# Patient Record
Sex: Female | Born: 1985
Health system: Southern US, Community
[De-identification: ages and names within clinical notes are randomized; demographics above are authoritative.]

## PROBLEM LIST (undated history)

## (undated) ENCOUNTER — Inpatient Hospital Stay (HOSPITAL_COMMUNITY): Payer: Self-pay

## (undated) DIAGNOSIS — Z8759 Personal history of other complications of pregnancy, childbirth and the puerperium: Secondary | ICD-10-CM

## (undated) DIAGNOSIS — R0600 Dyspnea, unspecified: Secondary | ICD-10-CM

## (undated) DIAGNOSIS — D649 Anemia, unspecified: Secondary | ICD-10-CM

## (undated) DIAGNOSIS — N390 Urinary tract infection, site not specified: Secondary | ICD-10-CM

## (undated) DIAGNOSIS — R87629 Unspecified abnormal cytological findings in specimens from vagina: Secondary | ICD-10-CM

## (undated) DIAGNOSIS — Z789 Other specified health status: Secondary | ICD-10-CM

## (undated) DIAGNOSIS — M419 Scoliosis, unspecified: Secondary | ICD-10-CM

## (undated) HISTORY — PX: THERAPEUTIC ABORTION: SHX798

## (undated) HISTORY — DX: Unspecified abnormal cytological findings in specimens from vagina: R87.629

## (undated) HISTORY — DX: Personal history of other complications of pregnancy, childbirth and the puerperium: Z87.59

## (undated) HISTORY — PX: LEEP: SHX91

---

## 2008-07-03 ENCOUNTER — Emergency Department (HOSPITAL_BASED_OUTPATIENT_CLINIC_OR_DEPARTMENT_OTHER): Admission: EM | Admit: 2008-07-03 | Discharge: 2008-07-03 | Payer: Self-pay | Admitting: Emergency Medicine

## 2008-12-26 ENCOUNTER — Emergency Department (HOSPITAL_BASED_OUTPATIENT_CLINIC_OR_DEPARTMENT_OTHER): Admission: EM | Admit: 2008-12-26 | Discharge: 2008-12-26 | Payer: Self-pay | Admitting: Emergency Medicine

## 2008-12-26 ENCOUNTER — Ambulatory Visit: Payer: Self-pay | Admitting: Diagnostic Radiology

## 2010-12-02 LAB — WET PREP, GENITAL: Yeast Wet Prep HPF POC: NONE SEEN

## 2010-12-02 LAB — CBC
HCT: 43.9 % (ref 36.0–46.0)
Hemoglobin: 15.2 g/dL — ABNORMAL HIGH (ref 12.0–15.0)
MCV: 88.3 fL (ref 78.0–100.0)
Platelets: 170 10*3/uL (ref 150–400)
RBC: 4.97 MIL/uL (ref 3.87–5.11)
WBC: 8.9 10*3/uL (ref 4.0–10.5)

## 2010-12-02 LAB — URINE MICROSCOPIC-ADD ON

## 2010-12-02 LAB — COMPREHENSIVE METABOLIC PANEL
Albumin: 4.5 g/dL (ref 3.5–5.2)
Alkaline Phosphatase: 76 U/L (ref 39–117)
BUN: 17 mg/dL (ref 6–23)
CO2: 23 mEq/L (ref 19–32)
Chloride: 107 mEq/L (ref 96–112)
Creatinine, Ser: 0.8 mg/dL (ref 0.4–1.2)
GFR calc non Af Amer: >60 mL/min (ref >60)
Glucose, Bld: 84 mg/dL (ref 70–99)
Potassium: 3.8 mEq/L (ref 3.5–5.1)
Total Bilirubin: 0.9 mg/dL (ref 0.3–1.2)

## 2010-12-02 LAB — DIFFERENTIAL
Basophils Absolute: 0.2 10*3/uL — ABNORMAL HIGH (ref 0.0–0.1)
Basophils Relative: 2 % — ABNORMAL HIGH (ref 0–1)
Lymphocytes Relative: 6 % — ABNORMAL LOW (ref 12–46)
Monocytes Absolute: 0.4 10*3/uL (ref 0.1–1.0)
Neutro Abs: 7.7 10*3/uL (ref 1.7–7.7)
Neutrophils Relative %: 87 % — ABNORMAL HIGH (ref 43–77)

## 2010-12-02 LAB — URINALYSIS, ROUTINE W REFLEX MICROSCOPIC
Bilirubin Urine: NEGATIVE
Nitrite: NEGATIVE
Specific Gravity, Urine: 1.025 (ref 1.005–1.030)
Urobilinogen, UA: 1 mg/dL (ref 0.0–1.0)
pH: 6 (ref 5.0–8.0)

## 2010-12-02 LAB — RPR: RPR Ser Ql: NONREACTIVE

## 2010-12-02 LAB — PREGNANCY, URINE: Preg Test, Ur: NEGATIVE

## 2010-12-02 LAB — GC/CHLAMYDIA PROBE AMP, GENITAL: Chlamydia, DNA Probe: NEGATIVE

## 2010-12-02 LAB — LIPASE, BLOOD: Lipase: 45 U/L (ref 23–300)

## 2011-09-14 ENCOUNTER — Encounter (HOSPITAL_BASED_OUTPATIENT_CLINIC_OR_DEPARTMENT_OTHER): Payer: Self-pay

## 2011-09-14 ENCOUNTER — Emergency Department (HOSPITAL_BASED_OUTPATIENT_CLINIC_OR_DEPARTMENT_OTHER)
Admission: EM | Admit: 2011-09-14 | Discharge: 2011-09-14 | Disposition: A | Discharge status: Home / Self Care | Payer: Medicaid Other | Attending: Emergency Medicine | Admitting: Emergency Medicine

## 2011-09-14 DIAGNOSIS — R109 Unspecified abdominal pain: Secondary | ICD-10-CM | POA: Insufficient documentation

## 2011-09-14 DIAGNOSIS — N390 Urinary tract infection, site not specified: Secondary | ICD-10-CM

## 2011-09-14 DIAGNOSIS — R300 Dysuria: Secondary | ICD-10-CM | POA: Insufficient documentation

## 2011-09-14 HISTORY — DX: Scoliosis, unspecified: M41.9

## 2011-09-14 LAB — WET PREP, GENITAL: Trich, Wet Prep: NONE SEEN

## 2011-09-14 LAB — URINALYSIS, ROUTINE W REFLEX MICROSCOPIC
Bilirubin Urine: NEGATIVE
Ketones, ur: 15 mg/dL — AB
Specific Gravity, Urine: 1.029 (ref 1.005–1.030)
Urobilinogen, UA: 1 mg/dL (ref 0.0–1.0)
pH: 6 (ref 5.0–8.0)

## 2011-09-14 MED ORDER — SULFAMETHOXAZOLE-TRIMETHOPRIM 800-160 MG PO TABS: 1.0000 | ORAL_TABLET | Freq: Two times a day (BID) | ORAL | Status: AC

## 2011-09-14 NOTE — ED Provider Notes (Signed)
History     CSN: [id number]  Arrival date & time 09/14/11  Theresa Winters   First MD Initiated Contact with Patient 09/14/11 1944      Chief Complaint  Patient presents with  . Abdominal Pain    (Consider location/radiation/quality/duration/timing/severity/associated sxs/prior treatment) HPI Comments: Pt states that she has had some urinary frequency:pt states that she had a period for the first time in 9 months since her last pregnancy:pt states that she is having Winters abdominal pressure like she had when she was pregnant and has salmonella poisoning  Patient is a 26 y.o. female presenting with abdominal pain. The history is provided by the patient. No language interpreter was used.  Abdominal Pain The primary symptoms of the illness include abdominal pain, dysuria and vaginal discharge. The primary symptoms of the illness do not include nausea, vomiting or vaginal bleeding. The current episode started 2 days ago. The onset of the illness was gradual. The problem has not changed since onset. The dysuria is associated with frequency and urgency.  The vaginal discharge is associated with dysuria.   Additional symptoms associated with the illness include urgency and frequency. Symptoms associated with the illness do not include anorexia or back pain.    Past Medical History  Diagnosis Date  . Scoliosis     History reviewed. No pertinent past surgical history.  History reviewed. No pertinent family history.  History  Substance Use Topics  . Smoking status: Never Smoker   . Smokeless tobacco: Never Used  . Alcohol Use: No    OB History    Grav Para Term Preterm Abortions TAB SAB Ect Mult Living                  Review of Systems  Gastrointestinal: Positive for abdominal pain. Negative for nausea, vomiting and anorexia.  Genitourinary: Positive for dysuria, urgency, frequency and vaginal discharge. Negative for vaginal bleeding.  Musculoskeletal: Negative for back pain.  All  other systems reviewed and are negative.    Allergies  Review of patient's allergies indicates no known allergies.  Home Medications  No current outpatient prescriptions on file.  BP 128/84  Pulse 70  Temp(Src) 98.2 F (36.8 C) (Oral)  Resp 16  Ht 5\' 6"  (1.676 m)  Wt 130 lb (58.968 kg)  BMI 20.98 kg/m2  SpO2 100%  LMP 09/06/2011  Physical Exam  Nursing note and vitals reviewed. Constitutional: She is oriented to person, place, and time. She appears well-developed and well-nourished.  HENT:  Head: Normocephalic and atraumatic.  Neck: Normal range of motion. Neck supple.  Cardiovascular: Normal rate and regular rhythm.   Pulmonary/Chest: Effort normal and breath sounds normal.  Abdominal: Soft. Bowel sounds are normal. There is no tenderness.  Genitourinary:       Pt has brown vaginal discharge  Musculoskeletal: Normal range of motion.  Neurological: She is alert and oriented to person, place, and time.  Skin: Skin is warm and dry.  Psychiatric: She has a normal mood and affect.    ED Course  Procedures (including critical care time)  Labs Reviewed  URINALYSIS, ROUTINE W REFLEX MICROSCOPIC - Abnormal; Notable for the following:    Color, Urine AMBER (*) BIOCHEMICALS MAY BE AFFECTED BY COLOR   APPearance CLOUDY (*)    Hgb urine dipstick LARGE (*)    Ketones, ur 15 (*)    Protein, ur 100 (*)    Nitrite POSITIVE (*)    Leukocytes, UA MODERATE (*)    All other components within  normal limits  WET PREP, GENITAL - Abnormal; Notable for the following:    Clue Cells, Wet Prep FEW (*)    WBC, Wet Prep HPF POC RARE (*)    All other components within normal limits  URINE MICROSCOPIC-ADD ON - Abnormal; Notable for the following:    Bacteria, UA MANY (*)    All other components within normal limits  PREGNANCY, URINE  GC/CHLAMYDIA PROBE AMP, GENITAL   No results found.   1. UTI (Winters urinary tract infection)       MDM  Exam not consistent with [id number] sent  will treat for uti       Theresa Lower, NP 09/14/11 2100

## 2011-09-14 NOTE — ED Provider Notes (Signed)
Medical screening examination/treatment/procedure(s) were performed by non-physician practitioner and as supervising physician I was immediately available for consultation/collaboration.   Elane Peabody, MD 09/14/11 2326 

## 2011-09-14 NOTE — ED Notes (Signed)
Pt states that she had abdominal pain onset two days ago, noticed tissue coming from her vagina.  Pt states that the last time she had this she had salmonella poisoning during her pregnancy.  No chance that she was pregnant or could be pregnant today.

## 2011-09-15 LAB — GC/CHLAMYDIA PROBE AMP, GENITAL
Chlamydia, DNA Probe: NEGATIVE
GC Probe Amp, Genital: NEGATIVE

## 2012-06-18 ENCOUNTER — Encounter (HOSPITAL_BASED_OUTPATIENT_CLINIC_OR_DEPARTMENT_OTHER): Payer: Self-pay | Admitting: *Deleted

## 2012-06-18 ENCOUNTER — Other Ambulatory Visit: Payer: Self-pay

## 2012-06-18 ENCOUNTER — Emergency Department (HOSPITAL_BASED_OUTPATIENT_CLINIC_OR_DEPARTMENT_OTHER)
Admission: EM | Admit: 2012-06-18 | Discharge: 2012-06-19 | Disposition: A | Discharge status: Home / Self Care | Payer: Medicaid Other | Attending: Emergency Medicine | Admitting: Emergency Medicine

## 2012-06-18 ENCOUNTER — Emergency Department (HOSPITAL_BASED_OUTPATIENT_CLINIC_OR_DEPARTMENT_OTHER): Payer: Medicaid Other

## 2012-06-18 DIAGNOSIS — F419 Anxiety disorder, unspecified: Secondary | ICD-10-CM

## 2012-06-18 DIAGNOSIS — F411 Generalized anxiety disorder: Secondary | ICD-10-CM | POA: Insufficient documentation

## 2012-06-18 DIAGNOSIS — Z8739 Personal history of other diseases of the musculoskeletal system and connective tissue: Secondary | ICD-10-CM | POA: Insufficient documentation

## 2012-06-18 LAB — D-DIMER, QUANTITATIVE: D-Dimer, Quant: <0.27 ug/mL-FEU (ref 0.00–0.48)

## 2012-06-18 MED ORDER — GI COCKTAIL ~~LOC~~
30.0000 mL | Freq: Once | ORAL | Status: DC
Start: 2012-06-18 — End: 2012-06-19

## 2012-06-18 MED ORDER — ALUM & MAG HYDROXIDE-SIMETH 200-200-20 MG/5ML PO SUSP
30.0000 mL | Freq: Once | ORAL | Status: AC
  Administered 2012-06-19: 30 mL via ORAL
  Filled 2012-06-18: qty 30

## 2012-06-18 NOTE — ED Provider Notes (Signed)
History  This chart was scribed for Logyn Kendrick Smitty Cords, MD by Ladona Ridgel Day. This patient was seen in room MH03/MH03 and the patient's care was started at 2209.   CSN: [id number]  Arrival date & time 06/18/12  2209   First MD Initiated Contact with Patient 06/18/12 2301      Chief Complaint  Patient presents with  . Chest Pain   Patient is a 26 y.o. female presenting with chest pain. The history is provided by the patient. No language interpreter was used.  Chest Pain The chest pain began 1 - 2 weeks ago. Chest pain occurs frequently. The chest pain is unchanged. The pain is associated with stress. The quality of the pain is described as dull. The pain does not radiate. Chest pain is worsened by stress. Pertinent negatives for primary symptoms include no fever, no shortness of breath, no abdominal pain, no nausea and no vomiting.  Pertinent negatives for associated symptoms include no claudication and no weakness. She tried nothing for the symptoms. Risk factors include no known risk factors.  Her past medical history is significant for anxiety/panic attacks.  Procedure history is negative for cardiac catheterization.    Ryker Pherigo is a 26 y.o. female who presents to the Emergency Department w/hx of anxiety complaining of constant CP/chest pressure for the past week. She states usually gets exacerbation of her chest pressure when she goes into work everyday because of high stress levels. She states some back pain. She takes Depo provera. She denies any recent long travels.   Past Medical History  Diagnosis Date  . Scoliosis   . Scoliosis     History reviewed. No pertinent past surgical history.  History reviewed. No pertinent family history.  History  Substance Use Topics  . Smoking status: Never Smoker   . Smokeless tobacco: Never Used  . Alcohol Use: No    OB History    Grav Para Term Preterm Abortions TAB SAB Ect Mult Living                  Review of Systems    Constitutional: Negative for fever and chills.  Respiratory: Positive for chest tightness. Negative for shortness of breath.   Cardiovascular: Positive for chest pain. Negative for claudication.  Gastrointestinal: Negative for nausea, vomiting and abdominal pain.  Skin: Negative for color change.  Neurological: Negative for weakness.  Psychiatric/Behavioral: The patient is nervous/anxious.   All other systems reviewed and are negative.    Allergies  Review of patient's allergies indicates no known allergies.  Home Medications  No current outpatient prescriptions on file.  Triage Vitals: BP 114/75  Pulse 82  Temp 97.9 F (36.6 C)  Resp 15  SpO2 100%  LMP 03/18/2012  Physical Exam  Nursing note and vitals reviewed. Constitutional: She is oriented to person, place, and time. She appears well-developed and well-nourished. No distress.  HENT:  Head: Normocephalic and atraumatic.  Mouth/Throat: Oropharynx is clear and moist.  Eyes: EOM are normal.  Neck: Neck supple. No tracheal deviation present.  Cardiovascular: Normal rate, regular rhythm and normal heart sounds.   No murmur heard. Pulmonary/Chest: Effort normal and breath sounds normal. No respiratory distress. She has no wheezes. She has no rales.  Abdominal: Soft. Bowel sounds are normal. She exhibits no distension. There is no tenderness. There is no rebound and no guarding.  Musculoskeletal: Normal range of motion.  Neurological: She is alert and oriented to person, place, and time. She displays normal reflexes.  Skin:  Skin is warm and dry.  Psychiatric: She has a normal mood and affect. Her behavior is normal.    ED Course  Procedures (including critical care time) DIAGNOSTIC STUDIES: Oxygen Saturation is 100% on room air, normal by my interpretation.    COORDINATION OF CARE: At 1110 PM Discussed treatment plan with patient which includes GI cocktail, CXR, D-dimer, preg UA. Patient agrees.   Labs Reviewed - No  data to display No results found.   No diagnosis found.    MDM   Date: 06/19/2012  Rate: 67  Rhythm: normal sinus rhythm  QRS Axis: normal  Intervals: normal  ST/T Wave abnormalities: normal  Conduction Disutrbances: none  Narrative Interpretation: unremarkable  I personally performed the services described in this documentation, which was scribed in my presence. The recorded information has been reviewed and considered.   Pain is clearly not cardiac in nature.  Patient seems to be having anxiety as episodes happen at work where patient is under stress.  Follow up with your PMD for ongoing care            Dinnis Rog K Tyresse Jayson-Rasch, MD 06/19/12 [phone]

## 2012-06-18 NOTE — ED Notes (Signed)
Pt reports chest pressure x one week and fells like her" heart is giving out"  Pt also reports SOB and one episode of N/V at intial onset one week ago

## 2012-06-19 NOTE — ED Notes (Signed)
Transported to xray 

## 2012-06-19 NOTE — ED Notes (Signed)
Returned from xray

## 2013-01-09 ENCOUNTER — Encounter (HOSPITAL_BASED_OUTPATIENT_CLINIC_OR_DEPARTMENT_OTHER): Payer: Self-pay | Admitting: Family Medicine

## 2013-01-09 ENCOUNTER — Emergency Department (HOSPITAL_BASED_OUTPATIENT_CLINIC_OR_DEPARTMENT_OTHER)
Admission: EM | Admit: 2013-01-09 | Discharge: 2013-01-09 | Disposition: A | Discharge status: Home / Self Care | Payer: Self-pay | Attending: Emergency Medicine | Admitting: Emergency Medicine

## 2013-01-09 DIAGNOSIS — R300 Dysuria: Secondary | ICD-10-CM | POA: Insufficient documentation

## 2013-01-09 DIAGNOSIS — R109 Unspecified abdominal pain: Secondary | ICD-10-CM | POA: Insufficient documentation

## 2013-01-09 DIAGNOSIS — N760 Acute vaginitis: Secondary | ICD-10-CM | POA: Insufficient documentation

## 2013-01-09 DIAGNOSIS — Z8739 Personal history of other diseases of the musculoskeletal system and connective tissue: Secondary | ICD-10-CM | POA: Insufficient documentation

## 2013-01-09 DIAGNOSIS — B9689 Other specified bacterial agents as the cause of diseases classified elsewhere: Secondary | ICD-10-CM

## 2013-01-09 DIAGNOSIS — Z3202 Encounter for pregnancy test, result negative: Secondary | ICD-10-CM | POA: Insufficient documentation

## 2013-01-09 DIAGNOSIS — N898 Other specified noninflammatory disorders of vagina: Secondary | ICD-10-CM | POA: Insufficient documentation

## 2013-01-09 LAB — URINALYSIS, ROUTINE W REFLEX MICROSCOPIC
Bilirubin Urine: NEGATIVE
Nitrite: NEGATIVE
Specific Gravity, Urine: 1.026 (ref 1.005–1.030)
Urobilinogen, UA: 1 mg/dL (ref 0.0–1.0)
pH: 5.5 (ref 5.0–8.0)

## 2013-01-09 LAB — WET PREP, GENITAL
Trich, Wet Prep: NONE SEEN
Yeast Wet Prep HPF POC: NONE SEEN

## 2013-01-09 LAB — PREGNANCY, URINE: Preg Test, Ur: NEGATIVE

## 2013-01-09 MED ORDER — METRONIDAZOLE 500 MG PO TABS: 500.0000 mg | ORAL_TABLET | Freq: Two times a day (BID) | ORAL | Status: DC

## 2013-01-09 NOTE — ED Provider Notes (Signed)
Medical screening examination/treatment/procedure(s) were performed by non-physician practitioner and as supervising physician I was immediately available for consultation/collaboration.   Joya Gaskins, MD 01/09/13 [phone]

## 2013-01-09 NOTE — ED Notes (Addendum)
Pt c/o left sided intermittent pain with a "knot that has been present for years". Pt reports recent uti and white vaginal discharge. Pt also sts she stopped depo shots and has been bleeding frequently. Pt gynecologist unable to see her for 2 wks.

## 2013-01-09 NOTE — ED Provider Notes (Signed)
History     CSN: [id number]  Arrival date & time 01/09/13  1215   First MD Initiated Contact with Patient 01/09/13 1219      Chief Complaint  Patient presents with  . Mass    (Consider location/radiation/quality/duration/timing/severity/associated sxs/prior treatment) Patient is a 27 y.o. female presenting with vaginal discharge. The history is provided by the patient and medical records. No language interpreter was used.  Vaginal Discharge This is a new problem. The current episode started in the past 7 days. The problem occurs intermittently. The problem has been waxing and waning. Associated symptoms include urinary symptoms. Pertinent negatives include no abdominal pain, anorexia, arthralgias, change in bowel habit, chest pain, chills, congestion, coughing, diaphoresis, fatigue, fever, headaches, joint swelling, myalgias, nausea, neck pain, numbness, rash, sore throat, swollen glands, vertigo, visual change, vomiting or weakness. Nothing aggravates the symptoms. Treatments tried: a prescribed yeast cream that was old. The treatment provided mild relief.   Theresa Winters is a 27 y.o. female  with a hx of scoliosis presents to the Emergency Department complaining of gradual, persistent, progressively worsening vaginal discharge, itching and dysuria for approximately one week. Patient states she used a vaginal cream she was prescribed several years ago for vaginal itching and states that it helped some; nothing seems to make it worse. Associated symptoms include a thick white vaginal discharge. Patient also complains of irregular menses after coming off of her Depo shot. She states she bleeds often and heavily.  She denies fever, chills, chest pain, shortness of breath abdominal pain nausea vomiting, diarrhea, weakness, dizziness, syncope.    Past Medical History  Diagnosis Date  . Scoliosis   . Scoliosis     History reviewed. No pertinent past surgical history.  No family history on  file.  History  Substance Use Topics  . Smoking status: Never Smoker   . Smokeless tobacco: Never Used  . Alcohol Use: No    OB History   Grav Para Term Preterm Abortions TAB SAB Ect Mult Living                  Review of Systems  Constitutional: Negative for fever, chills, diaphoresis, appetite change, fatigue and unexpected weight change.  HENT: Negative for congestion, sore throat, neck pain and neck stiffness.   Respiratory: Negative for cough, chest tightness and shortness of breath.   Cardiovascular: Negative for chest pain.  Gastrointestinal: Negative for nausea, vomiting, abdominal pain, diarrhea, constipation, blood in stool, abdominal distention, rectal pain, anorexia and change in bowel habit.  Genitourinary: Positive for dysuria, vaginal bleeding, vaginal discharge, menstrual problem and pelvic pain. Negative for urgency, frequency, hematuria, flank pain, difficulty urinating and vaginal pain.  Musculoskeletal: Negative for myalgias, back pain, joint swelling and arthralgias.  Skin: Negative for rash.  Neurological: Negative for dizziness, vertigo, weakness, light-headedness, numbness and headaches.  All other systems reviewed and are negative.    Allergies  Review of patient's allergies indicates no known allergies.  Home Medications   Current Outpatient Rx  Name  Route  Sig  Dispense  Refill  . Multiple Vitamins-Calcium (ONE-A-DAY WOMENS PO)   Oral   Take by mouth.         . metroNIDAZOLE (FLAGYL) 500 MG tablet   Oral   Take 1 tablet (500 mg total) by mouth 2 (two) times daily. One po bid x 7 days   14 tablet   0     BP 121/76  Pulse 68  Temp(Src) 97.9 F (36.6 C) (Oral)  Resp 16  SpO2 100%  LMP 01/08/2013  Physical Exam  Nursing note and vitals reviewed. Constitutional: She is oriented to person, place, and time. She appears well-developed and well-nourished. No distress.  HENT:  Head: Normocephalic and atraumatic.  Mouth/Throat:  Oropharynx is clear and moist.  Eyes: Conjunctivae are normal. Pupils are equal, round, and reactive to light. No scleral icterus.  Neck: Normal range of motion. Neck supple.  Cardiovascular: Normal rate, regular rhythm, normal heart sounds and intact distal pulses.   No murmur heard. Pulmonary/Chest: Effort normal and breath sounds normal. No respiratory distress. She has no wheezes. She has no rales. She exhibits no tenderness.  Abdominal: Soft. Normal appearance and bowel sounds are normal. She exhibits no mass. There is no hepatosplenomegaly. There is no tenderness. There is no rigidity, no rebound, no guarding and no CVA tenderness. Hernia confirmed negative in the right inguinal area and confirmed negative in the left inguinal area.    Genitourinary: Uterus normal. Pelvic exam was performed with patient supine. There is no rash, tenderness or lesion on the right labia. There is no rash, tenderness or lesion on the left labia. Uterus is not deviated, not enlarged, not fixed and not tender. Cervix exhibits no motion tenderness, no discharge and no friability. Right adnexum displays no mass, no tenderness and no fullness. Left adnexum displays no mass, no tenderness and no fullness. There is bleeding around the vagina. No erythema or tenderness around the vagina. No foreign body around the vagina. No signs of injury around the vagina. Vaginal discharge (thick, color obscured by blood, minmal amout) found.  Musculoskeletal: Normal range of motion. She exhibits no edema and no tenderness.  Lymphadenopathy:    She has no cervical adenopathy.       Right: No inguinal adenopathy present.       Left: No inguinal adenopathy present.  Neurological: She is alert and oriented to person, place, and time. She exhibits normal muscle tone. Coordination normal.  Speech is clear and goal oriented Moves extremities without ataxia  Skin: Skin is warm and dry. No rash noted. She is not diaphoretic.  Psychiatric:  She has a normal mood and affect.    ED Course  Procedures (including critical care time)  Labs Reviewed  WET PREP, GENITAL - Abnormal; Notable for the following:    Clue Cells Wet Prep HPF POC FEW (*)    WBC, Wet Prep HPF POC FEW (*)    All other components within normal limits  URINALYSIS, ROUTINE W REFLEX MICROSCOPIC - Abnormal; Notable for the following:    Ketones, ur 15 (*)    All other components within normal limits  GC/CHLAMYDIA PROBE AMP  PREGNANCY, URINE   No results found.   1. BV (bacterial vaginosis)       MDM  Dianah Field presents with vaginal discharge and dysuria as well as complaints of her regular menses.  Will obtain urine and vaginal samples.  Urinalysis without evidence of urinary tract infection, pregnancy test negative, wet prep with few clue cells and a few WBCs.   Pt understands that they have GC/Chlamydia cultures pending and that they will need to inform all sexual partners if results return positive, though the patient denies sexual activity. Pt exam not concerning for PID because hemodynamically stable and no cervical motion tenderness on pelvic exam. Pt has also been treated with flagyl for Bacterial Vaginosis and has been advised to not drink alcohol while on this medication.   Also discussed with patient  the need to followup with her OB/GYN to discuss further birth control options and regulation of her menses.  I have also discussed reasons to return immediately to the ER.  Patient expresses understanding and agrees with plan.             Dahlia Client Johnwesley Lederman, PA-C 01/09/13 1327

## 2013-01-10 LAB — GC/CHLAMYDIA PROBE AMP: CT Probe RNA: NEGATIVE

## 2013-01-13 ENCOUNTER — Encounter (HOSPITAL_BASED_OUTPATIENT_CLINIC_OR_DEPARTMENT_OTHER): Payer: Self-pay | Admitting: Emergency Medicine

## 2013-01-13 DIAGNOSIS — R0982 Postnasal drip: Secondary | ICD-10-CM | POA: Insufficient documentation

## 2013-01-13 DIAGNOSIS — N390 Urinary tract infection, site not specified: Secondary | ICD-10-CM | POA: Insufficient documentation

## 2013-01-13 DIAGNOSIS — Z3202 Encounter for pregnancy test, result negative: Secondary | ICD-10-CM | POA: Insufficient documentation

## 2013-01-13 DIAGNOSIS — K029 Dental caries, unspecified: Secondary | ICD-10-CM | POA: Insufficient documentation

## 2013-01-13 DIAGNOSIS — R300 Dysuria: Secondary | ICD-10-CM | POA: Insufficient documentation

## 2013-01-13 DIAGNOSIS — R51 Headache: Secondary | ICD-10-CM | POA: Insufficient documentation

## 2013-01-13 DIAGNOSIS — Z8739 Personal history of other diseases of the musculoskeletal system and connective tissue: Secondary | ICD-10-CM | POA: Insufficient documentation

## 2013-01-13 NOTE — ED Notes (Signed)
Pt reports "sinus infection". C/o nasal congestion, sore throat, and eyes watering. Reports right temporal HA as well.

## 2013-01-14 ENCOUNTER — Emergency Department (HOSPITAL_BASED_OUTPATIENT_CLINIC_OR_DEPARTMENT_OTHER)
Admission: EM | Admit: 2013-01-14 | Discharge: 2013-01-14 | Disposition: A | Discharge status: Home / Self Care | Payer: Self-pay | Attending: Emergency Medicine | Admitting: Emergency Medicine

## 2013-01-14 DIAGNOSIS — R0982 Postnasal drip: Secondary | ICD-10-CM

## 2013-01-14 DIAGNOSIS — N390 Urinary tract infection, site not specified: Secondary | ICD-10-CM

## 2013-01-14 DIAGNOSIS — K029 Dental caries, unspecified: Secondary | ICD-10-CM

## 2013-01-14 LAB — URINALYSIS, ROUTINE W REFLEX MICROSCOPIC
Glucose, UA: NEGATIVE mg/dL
Hgb urine dipstick: NEGATIVE
Specific Gravity, Urine: 1.037 — ABNORMAL HIGH (ref 1.005–1.030)
pH: 6.5 (ref 5.0–8.0)

## 2013-01-14 LAB — URINE MICROSCOPIC-ADD ON

## 2013-01-14 MED ORDER — PENICILLIN V POTASSIUM 500 MG PO TABS: 500.0000 mg | ORAL_TABLET | Freq: Four times a day (QID) | ORAL | Status: AC

## 2013-01-14 MED ORDER — NITROFURANTOIN MONOHYD MACRO 100 MG PO CAPS
100.0000 mg | ORAL_CAPSULE | Freq: Once | ORAL | Status: AC
  Administered 2013-01-14: 100 mg via ORAL
  Filled 2013-01-14: qty 1

## 2013-01-14 MED ORDER — IBUPROFEN 600 MG PO TABS: 600.0000 mg | ORAL_TABLET | Freq: Four times a day (QID) | ORAL | Status: DC | PRN

## 2013-01-14 MED ORDER — PENICILLIN V POTASSIUM 250 MG PO TABS
500.0000 mg | ORAL_TABLET | Freq: Once | ORAL | Status: AC
  Administered 2013-01-14: 500 mg via ORAL
  Filled 2013-01-14: qty 2

## 2013-01-14 MED ORDER — NITROFURANTOIN MONOHYD MACRO 100 MG PO CAPS: 100.0000 mg | ORAL_CAPSULE | Freq: Two times a day (BID) | ORAL | Status: DC

## 2013-01-14 MED ORDER — IBUPROFEN 800 MG PO TABS
800.0000 mg | ORAL_TABLET | Freq: Once | ORAL | Status: AC
  Administered 2013-01-14: 800 mg via ORAL
  Filled 2013-01-14: qty 1

## 2013-01-14 NOTE — ED Provider Notes (Signed)
History     CSN: [id number]  Arrival date & time 01/13/13  2334   First MD Initiated Contact with Patient 01/14/13 0129      Chief Complaint  Patient presents with  . Nasal Congestion  . Headache    (Consider location/radiation/quality/duration/timing/severity/associated sxs/prior treatment) Patient is a 27 y.o. female presenting with headaches. The history is provided by the patient.  Headache Location: right facial. Radiates to:  Does not radiate Onset quality:  Gradual Timing:  Constant Progression:  Unchanged Chronicity:  New Context: not defecating   Context comment:  Nasal congestion and rhinorrhea and mouth pain Relieved by:  Nothing Worsened by:  Nothing tried Ineffective treatments:  None tried Associated symptoms: no abdominal pain, no fever, no neck pain, no neck stiffness, no numbness and no sinus pressure   Associated symptoms comment:  Dysuria x one week Risk factors: no family hx of SAH     Past Medical History  Diagnosis Date  . Scoliosis   . Scoliosis     History reviewed. No pertinent past surgical history.  No family history on file.  History  Substance Use Topics  . Smoking status: Never Smoker   . Smokeless tobacco: Never Used  . Alcohol Use: No    OB History   Grav Para Term Preterm Abortions TAB SAB Ect Mult Living                  Review of Systems  Constitutional: Negative for fever.  HENT: Negative for drooling, neck pain, neck stiffness, voice change and sinus pressure.   Gastrointestinal: Negative for abdominal pain.  Neurological: Positive for headaches. Negative for facial asymmetry, speech difficulty and numbness.  All other systems reviewed and are negative.    Allergies  Review of patient's allergies indicates no known allergies.  Home Medications   Current Outpatient Rx  Name  Route  Sig  Dispense  Refill  . metroNIDAZOLE (FLAGYL) 500 MG tablet   Oral   Take 1 tablet (500 mg total) by mouth 2 (two) times  daily. One po bid x 7 days   14 tablet   0   . Multiple Vitamins-Calcium (ONE-A-DAY WOMENS PO)   Oral   Take by mouth.           BP 116/79  Pulse 74  Temp(Src) 97.7 F (36.5 C) (Oral)  Resp 16  Ht 5\' 7"  (1.702 m)  Wt 135 lb (61.236 kg)  BMI 21.14 kg/m2  SpO2 98%  LMP 01/08/2013  Physical Exam  Constitutional: She is oriented to person, place, and time. She appears well-developed and well-nourished. No distress.  HENT:  Head: Normocephalic and atraumatic.  Mouth/Throat: Oropharynx is clear and moist.    Clear colorless post nasal drainage  Eyes: Conjunctivae and EOM are normal. Pupils are equal, round, and reactive to light.  No temporal tenderness  Neck: Normal range of motion. Neck supple.  Cardiovascular: Normal rate, regular rhythm and intact distal pulses.   Pulmonary/Chest: Effort normal. She has no wheezes. She has no rales.  Abdominal: Soft. Bowel sounds are normal.  Musculoskeletal: Normal range of motion. She exhibits no edema.  Lymphadenopathy:    She has no cervical adenopathy.  Neurological: She is alert and oriented to person, place, and time. No cranial nerve deficit.  Skin: Skin is warm and dry.  Psychiatric: She has a normal mood and affect.    ED Course  Procedures (including critical care time)  Labs Reviewed  URINALYSIS, ROUTINE W REFLEX MICROSCOPIC -  Abnormal; Notable for the following:    Color, Urine BROWN (*)    Specific Gravity, Urine 1.037 (*)    Bilirubin Urine SMALL (*)    Ketones, ur 15 (*)    Protein, ur 30 (*)    Nitrite POSITIVE (*)    Leukocytes, UA SMALL (*)    All other components within normal limits  URINE MICROSCOPIC-ADD ON - Abnormal; Notable for the following:    Bacteria, UA FEW (*)    All other components within normal limits  PREGNANCY, URINE   No results found.   No diagnosis found.    MDM  Facial pain is from dental caries, and urinary symptoms from UTI will need to cover for both and arrange close  follow up with dentist       Kristy Catoe K Trayvon Trumbull-Rasch, MD 01/14/13 [phone]

## 2013-04-26 ENCOUNTER — Emergency Department (HOSPITAL_BASED_OUTPATIENT_CLINIC_OR_DEPARTMENT_OTHER)
Admission: EM | Admit: 2013-04-26 | Discharge: 2013-04-26 | Disposition: A | Discharge status: Home / Self Care | Payer: Medicaid Other | Attending: Emergency Medicine | Admitting: Emergency Medicine

## 2013-04-26 ENCOUNTER — Encounter (HOSPITAL_BASED_OUTPATIENT_CLINIC_OR_DEPARTMENT_OTHER): Payer: Self-pay | Admitting: Student

## 2013-04-26 DIAGNOSIS — N898 Other specified noninflammatory disorders of vagina: Secondary | ICD-10-CM | POA: Insufficient documentation

## 2013-04-26 DIAGNOSIS — Z8739 Personal history of other diseases of the musculoskeletal system and connective tissue: Secondary | ICD-10-CM | POA: Insufficient documentation

## 2013-04-26 DIAGNOSIS — Z3202 Encounter for pregnancy test, result negative: Secondary | ICD-10-CM | POA: Insufficient documentation

## 2013-04-26 DIAGNOSIS — R300 Dysuria: Secondary | ICD-10-CM | POA: Insufficient documentation

## 2013-04-26 LAB — URINALYSIS, ROUTINE W REFLEX MICROSCOPIC
Bilirubin Urine: NEGATIVE
Glucose, UA: NEGATIVE mg/dL
Hgb urine dipstick: NEGATIVE
Ketones, ur: NEGATIVE mg/dL
Protein, ur: NEGATIVE mg/dL
Urobilinogen, UA: 1 mg/dL (ref 0.0–1.0)

## 2013-04-26 LAB — URINE MICROSCOPIC-ADD ON

## 2013-04-26 LAB — WET PREP, GENITAL: Yeast Wet Prep HPF POC: NONE SEEN

## 2013-04-26 MED ORDER — NITROFURANTOIN MONOHYD MACRO 100 MG PO CAPS: 100.0000 mg | ORAL_CAPSULE | Freq: Two times a day (BID) | ORAL | Status: AC

## 2013-04-26 NOTE — ED Provider Notes (Signed)
CSN: [id number]     Arrival date & time 04/26/13  1629 History   First MD Initiated Contact with Patient 04/26/13 1649     Chief Complaint  Patient presents with  . Flank Pain    left side x 1 week   (Consider location/radiation/quality/duration/timing/severity/associated sxs/prior Treatment) Patient is a 27 y.o. female presenting with vaginal discharge. The history is provided by the patient. No language interpreter was used.  Vaginal Discharge Quality:  Clear Severity:  Moderate Timing:  Constant Progression:  Worsening Chronicity:  New Context: not after intercourse   Relieved by:  Nothing Ineffective treatments:  None tried Associated symptoms: dysuria   Pt reports she has had a vaginal odor.   Pt reports she was previously diagnosed with a uti and a vaginal infection.   Pt did not take medications  Past Medical History  Diagnosis Date  . Scoliosis   . Scoliosis    History reviewed. No pertinent past surgical history. History reviewed. No pertinent family history. History  Substance Use Topics  . Smoking status: Never Smoker   . Smokeless tobacco: Never Used  . Alcohol Use: No   OB History   Grav Para Term Preterm Abortions TAB SAB Ect Mult Living                 Review of Systems  Genitourinary: Positive for dysuria and vaginal discharge.  All other systems reviewed and are negative.    Allergies  Review of patient's allergies indicates no known allergies.  Home Medications   Current Outpatient Rx  Name  Route  Sig  Dispense  Refill  . ibuprofen (ADVIL,MOTRIN) 600 MG tablet   Oral   Take 1 tablet (600 mg total) by mouth every 6 (six) hours as needed for pain.   30 tablet   0   . Multiple Vitamins-Calcium (ONE-A-DAY WOMENS PO)   Oral   Take by mouth.          BP 132/77  Pulse 77  Temp(Src) 98.2 F (36.8 C) (Oral)  Resp 20  Wt 135 lb (61.236 kg)  BMI 21.14 kg/m2  SpO2 100%  LMP 04/22/2013 Physical Exam  Constitutional: She is oriented to  person, place, and time. She appears well-developed and well-nourished.  HENT:  Head: Normocephalic.  Eyes: Pupils are equal, round, and reactive to light.  Neck: Normal range of motion.  Cardiovascular: Normal rate.   Pulmonary/Chest: Effort normal and breath sounds normal.  Abdominal: Soft.  Genitourinary: Vaginal discharge found.  Scant amount white discharge  Musculoskeletal: Normal range of motion.  Neurological: She is alert and oriented to person, place, and time.  Skin: Skin is warm.  Psychiatric: She has a normal mood and affect.    ED Course  Procedures (including critical care time) Labs Review Labs Reviewed  PREGNANCY, URINE  URINALYSIS, ROUTINE W REFLEX MICROSCOPIC   Imaging Review No results found.  MDM   1. Dysuria    Urine 3-6 wbc's   Wet prep few clue cells.    Pt given rx for macrobid.   Culture pending    Elson Areas, PA-C 04/26/13 1820  Lonia Skinner [place], PA-C 04/26/13 [phone]

## 2013-04-26 NOTE — ED Notes (Signed)
Pt in with c/o vaginal d/c (white and thick), left flank pain and increased urinary frequency

## 2013-04-26 NOTE — ED Provider Notes (Signed)
Medical screening examination/treatment/procedure(s) were performed by non-physician practitioner and as supervising physician I was immediately available for consultation/collaboration.   Charles B. Sheldon, MD 04/26/13 2304 

## 2013-04-27 LAB — GC/CHLAMYDIA PROBE AMP
CT Probe RNA: NEGATIVE
GC Probe RNA: NEGATIVE

## 2013-06-04 ENCOUNTER — Emergency Department (HOSPITAL_BASED_OUTPATIENT_CLINIC_OR_DEPARTMENT_OTHER)
Admission: EM | Admit: 2013-06-04 | Discharge: 2013-06-05 | Disposition: A | Discharge status: Home / Self Care | Payer: Medicaid Other | Attending: Emergency Medicine | Admitting: Emergency Medicine

## 2013-06-04 ENCOUNTER — Encounter (HOSPITAL_BASED_OUTPATIENT_CLINIC_OR_DEPARTMENT_OTHER): Payer: Self-pay | Admitting: Emergency Medicine

## 2013-06-04 DIAGNOSIS — O9989 Other specified diseases and conditions complicating pregnancy, childbirth and the puerperium: Secondary | ICD-10-CM | POA: Insufficient documentation

## 2013-06-04 DIAGNOSIS — Z8739 Personal history of other diseases of the musculoskeletal system and connective tissue: Secondary | ICD-10-CM | POA: Insufficient documentation

## 2013-06-04 DIAGNOSIS — R42 Dizziness and giddiness: Secondary | ICD-10-CM | POA: Insufficient documentation

## 2013-06-04 DIAGNOSIS — O219 Vomiting of pregnancy, unspecified: Secondary | ICD-10-CM | POA: Insufficient documentation

## 2013-06-04 DIAGNOSIS — R1032 Left lower quadrant pain: Secondary | ICD-10-CM | POA: Insufficient documentation

## 2013-06-04 DIAGNOSIS — Z79899 Other long term (current) drug therapy: Secondary | ICD-10-CM | POA: Insufficient documentation

## 2013-06-04 LAB — URINALYSIS, ROUTINE W REFLEX MICROSCOPIC
Bilirubin Urine: NEGATIVE
Glucose, UA: NEGATIVE mg/dL
Hgb urine dipstick: NEGATIVE
Protein, ur: NEGATIVE mg/dL
Specific Gravity, Urine: 1.02 (ref 1.005–1.030)

## 2013-06-04 MED ORDER — ONDANSETRON 8 MG PO TBDP
8.0000 mg | ORAL_TABLET | Freq: Once | ORAL | Status: AC
  Administered 2013-06-04: 8 mg via ORAL
  Filled 2013-06-04: qty 1

## 2013-06-04 NOTE — ED Notes (Signed)
Pt c/o vomiting and nausea for few days, pt is [redacted] weeks pregnant

## 2013-06-04 NOTE — ED Notes (Signed)
MD at bedside. 

## 2013-06-04 NOTE — ED Notes (Signed)
Pt attempting to eat saltine crackers.

## 2013-06-04 NOTE — ED Provider Notes (Signed)
CSN: [id number]     Arrival date & time 06/04/13  2242 History  This chart was scribed for Derwood Kaplan, MD by Greggory Stallion, ED Scribe. This patient was seen in room MH10/MH10 and the patient's care was started at 11:09 PM.   Chief Complaint  Patient presents with  . Emesis During Pregnancy   The history is provided by the patient. No language interpreter was used.   HPI Comments: Theresa Winters is a 27 y.o. female that is [redacted] weeks pregnant who presents to the Emergency Department complaining of nausea and emesis that started 3-4 days ago. Pt states she has been throwing up something black for the last 2 days. She states she can not keep any food down. Pt states she is also getting light headed. She has cramping abdominal pain in the mornings but denies current abdominal pain. She has been taking ibuprofen with no relief. She denies dysuria, hematuria, vaginal bleeding, fever, chills, dizziness and diarrhea.   Past Medical History  Diagnosis Date  . Scoliosis   . Scoliosis    History reviewed. No pertinent past surgical history. History reviewed. No pertinent family history. History  Substance Use Topics  . Smoking status: Never Smoker   . Smokeless tobacco: Never Used  . Alcohol Use: No   OB History   Grav Para Term Preterm Abortions TAB SAB Ect Mult Living   1              Review of Systems  Constitutional: Negative for fever and chills.  Gastrointestinal: Positive for nausea and vomiting. Negative for abdominal pain and diarrhea.  Genitourinary: Negative for dysuria, hematuria and vaginal bleeding.  Neurological: Positive for light-headedness. Negative for dizziness.    Allergies  Review of patient's allergies indicates no known allergies.  Home Medications   Current Outpatient Rx  Name  Route  Sig  Dispense  Refill  . Prenatal Vit-Fe Fumarate-FA (PRENATAL MULTIVITAMIN) TABS tablet   Oral   Take 1 tablet by mouth daily at 12 noon.         Marland Kitchen ibuprofen  (ADVIL,MOTRIN) 600 MG tablet   Oral   Take 1 tablet (600 mg total) by mouth every 6 (six) hours as needed for pain.   30 tablet   0   . Multiple Vitamins-Calcium (ONE-A-DAY WOMENS PO)   Oral   Take by mouth.          BP 131/86  Pulse 73  Temp(Src) 97.7 F (36.5 C) (Oral)  Resp 16  Ht 5\' 7"  (1.702 m)  Wt 145 lb (65.772 kg)  BMI 22.71 kg/m2  SpO2 100%  LMP 04/11/2013  Physical Exam  Nursing note and vitals reviewed. Constitutional: She is oriented to person, place, and time. She appears well-developed and well-nourished. No distress.  HENT:  Head: Normocephalic and atraumatic.  Mouth/Throat: Mucous membranes are normal.  Eyes: EOM are normal. No scleral icterus.  Neck: Normal range of motion. No tracheal deviation present.  Cardiovascular: Normal rate, regular rhythm and normal heart sounds.   No murmur heard. Pulmonary/Chest: Effort normal and breath sounds normal. No respiratory distress. She has no wheezes. She has no rales.  Abdominal: Soft. There is tenderness (LLQ).  No flank tenderness.   Musculoskeletal: Normal range of motion.  Neurological: She is alert and oriented to person, place, and time.  Skin: Skin is warm and dry.  Psychiatric: She has a normal mood and affect. Her behavior is normal.    ED Course  Procedures (including critical care  time)  DIAGNOSTIC STUDIES: Oxygen Saturation is 100% on RA, normal by my interpretation.    COORDINATION OF CARE: 11:15 PM-Discussed treatment plan which includes UA and Zofran with pt at bedside and pt agreed to plan.   Labs Review Labs Reviewed - No data to display Imaging Review No results found.  EKG Interpretation   None       MDM  No diagnosis found. I personally performed the services described in this documentation, which was scribed in my presence. The recorded information has been reviewed and is accurate.  Pt comes in with cc of nausea, emesis. Emesis x 3, dark in color. No abd pain. No frank  blood seen. Thinking that the emesis is likely just bilious. No risk factors for UGIB. Vitals are stable, clinically not orthostatic. US shows no ketones. Oral challenge tolerated. No GU complain at all. Will d/c. Next week has OB appt which is reassuring.  Derwood Kaplan, MD 06/05/13 [phone]

## 2013-06-05 MED ORDER — ONDANSETRON 8 MG PO TBDP: 8.0000 mg | ORAL_TABLET | Freq: Three times a day (TID) | ORAL | Status: DC | PRN

## 2013-07-23 ENCOUNTER — Emergency Department (HOSPITAL_BASED_OUTPATIENT_CLINIC_OR_DEPARTMENT_OTHER)
Admission: EM | Admit: 2013-07-23 | Discharge: 2013-07-23 | Disposition: A | Discharge status: Home / Self Care | Payer: Medicaid Other | Attending: Emergency Medicine | Admitting: Emergency Medicine

## 2013-07-23 ENCOUNTER — Encounter (HOSPITAL_BASED_OUTPATIENT_CLINIC_OR_DEPARTMENT_OTHER): Payer: Self-pay | Admitting: Emergency Medicine

## 2013-07-23 DIAGNOSIS — N390 Urinary tract infection, site not specified: Secondary | ICD-10-CM | POA: Insufficient documentation

## 2013-07-23 DIAGNOSIS — Z8739 Personal history of other diseases of the musculoskeletal system and connective tissue: Secondary | ICD-10-CM | POA: Insufficient documentation

## 2013-07-23 DIAGNOSIS — Z3202 Encounter for pregnancy test, result negative: Secondary | ICD-10-CM | POA: Insufficient documentation

## 2013-07-23 DIAGNOSIS — B9689 Other specified bacterial agents as the cause of diseases classified elsewhere: Secondary | ICD-10-CM | POA: Insufficient documentation

## 2013-07-23 DIAGNOSIS — N760 Acute vaginitis: Secondary | ICD-10-CM | POA: Insufficient documentation

## 2013-07-23 DIAGNOSIS — A499 Bacterial infection, unspecified: Secondary | ICD-10-CM | POA: Insufficient documentation

## 2013-07-23 LAB — URINE MICROSCOPIC-ADD ON

## 2013-07-23 LAB — URINALYSIS, ROUTINE W REFLEX MICROSCOPIC
Glucose, UA: NEGATIVE mg/dL
Nitrite: POSITIVE — AB
Specific Gravity, Urine: 1.025 (ref 1.005–1.030)
pH: 5.5 (ref 5.0–8.0)

## 2013-07-23 LAB — WET PREP, GENITAL: Yeast Wet Prep HPF POC: NONE SEEN

## 2013-07-23 LAB — PREGNANCY, URINE: Preg Test, Ur: NEGATIVE

## 2013-07-23 MED ORDER — ONDANSETRON 4 MG PO TBDP
4.0000 mg | ORAL_TABLET | Freq: Once | ORAL | Status: AC
  Administered 2013-07-23: 4 mg via ORAL
  Filled 2013-07-23: qty 1

## 2013-07-23 MED ORDER — AZITHROMYCIN 250 MG PO TABS
1000.0000 mg | ORAL_TABLET | Freq: Once | ORAL | Status: AC
  Administered 2013-07-23: 1000 mg via ORAL
  Filled 2013-07-23: qty 4

## 2013-07-23 MED ORDER — CEPHALEXIN 250 MG PO CAPS
500.0000 mg | ORAL_CAPSULE | Freq: Once | ORAL | Status: AC
  Administered 2013-07-23: 500 mg via ORAL
  Filled 2013-07-23: qty 2

## 2013-07-23 MED ORDER — CEFTRIAXONE SODIUM 250 MG IJ SOLR
250.0000 mg | Freq: Once | INTRAMUSCULAR | Status: AC
  Administered 2013-07-23: 250 mg via INTRAMUSCULAR
  Filled 2013-07-23: qty 250

## 2013-07-23 MED ORDER — METRONIDAZOLE 500 MG PO TABS: 500.0000 mg | ORAL_TABLET | Freq: Two times a day (BID) | ORAL | Status: DC

## 2013-07-23 MED ORDER — CEPHALEXIN 500 MG PO CAPS: 500.0000 mg | ORAL_CAPSULE | Freq: Four times a day (QID) | ORAL | Status: DC

## 2013-07-23 MED ORDER — LIDOCAINE HCL (PF) 1 % IJ SOLN
INTRAMUSCULAR | Status: AC
  Administered 2013-07-23: 0.9 mL
  Filled 2013-07-23: qty 5

## 2013-07-23 MED ORDER — METRONIDAZOLE 500 MG PO TABS
500.0000 mg | ORAL_TABLET | Freq: Once | ORAL | Status: AC
  Administered 2013-07-23: 500 mg via ORAL
  Filled 2013-07-23: qty 1

## 2013-07-23 NOTE — ED Notes (Addendum)
Reports unprotected sex 3 days ago- urinary pain and vag d/c since last night. Also states had abortion 2 weeks ago and thinks her period may be starting today

## 2013-07-23 NOTE — ED Provider Notes (Signed)
CSN: [id number]     Arrival date & time 07/23/13  1754 History  This chart was scribed for Dagmar Hait, MD by Dorothey Baseman, ED Scribe. This patient was seen in room MH01/MH01 and the patient's care was started at 6:15 PM.    Chief Complaint  Patient presents with  . Dysuria   Patient is a 27 y.o. female presenting with dysuria. The history is provided by the patient. No language interpreter was used.  Dysuria Pain severity:  Moderate Onset quality:  Sudden Timing:  Constant Chronicity:  New Relieved by:  None tried Worsened by:  Nothing tried Ineffective treatments:  None tried Associated symptoms: abdominal pain and vaginal discharge   Associated symptoms: no fever, no nausea and no vomiting   Risk factors: sexually active    HPI Comments: Theresa Winters is a 27 y.o. female who presents to the Emergency Department complaining of a dysuria with associated urgency, vaginal discharge, and some mild, abdominal pain onset last night. Patient reports that she started her menstrual period this morning and that the abdominal pain could be due to that. Patient reports that she had unprotected intercourse 3 days ago with a new partner and expresses concern for a possible STD. She denies fever, nausea, emesis. Patient reports a history chlamydia around 6-7 years ago. Patient reports that she had an abortion 2 weeks ago.   Past Medical History  Diagnosis Date  . Scoliosis   . Scoliosis    Past Surgical History  Procedure Laterality Date  . Therapeutic abortion     No family history on file. History  Substance Use Topics  . Smoking status: Never Smoker   . Smokeless tobacco: Never Used  . Alcohol Use: Yes   OB History   Grav Para Term Preterm Abortions TAB SAB Ect Mult Living   1              Review of Systems  Constitutional: Negative for fever.  Gastrointestinal: Positive for abdominal pain. Negative for nausea and vomiting.  Genitourinary: Positive for dysuria, urgency  and vaginal discharge.  All other systems reviewed and are negative.    Allergies  Review of patient's allergies indicates no known allergies.  Home Medications   Current Outpatient Rx  Name  Route  Sig  Dispense  Refill  . ibuprofen (ADVIL,MOTRIN) 600 MG tablet   Oral   Take 1 tablet (600 mg total) by mouth every 6 (six) hours as needed for pain.   30 tablet   0   . Multiple Vitamins-Calcium (ONE-A-DAY WOMENS PO)   Oral   Take by mouth.         . Prenatal Vit-Fe Fumarate-FA (PRENATAL MULTIVITAMIN) TABS tablet   Oral   Take 1 tablet by mouth daily at 12 noon.         . ondansetron (ZOFRAN ODT) 8 MG disintegrating tablet   Oral   Take 1 tablet (8 mg total) by mouth every 8 (eight) hours as needed for nausea.   20 tablet   0    Triage Vitals: BP 114/79  Pulse 77  Temp(Src) 98.4 F (36.9 C) (Oral)  Resp 18  Wt 145 lb (65.772 kg)  SpO2 100%  LMP 04/11/2013  Breastfeeding? Unknown  Physical Exam  Nursing note and vitals reviewed. Constitutional: She is oriented to person, place, and time. She appears well-developed and well-nourished. No distress.  HENT:  Head: Normocephalic and atraumatic.  Eyes: Conjunctivae are normal.  Neck: Normal range of motion. Neck  supple.  Cardiovascular: Normal rate, regular rhythm and normal heart sounds.  Exam reveals no gallop and no friction rub.   No murmur heard. Pulmonary/Chest: Effort normal and breath sounds normal. No respiratory distress.  Abdominal: She exhibits no distension.  Genitourinary: Cervix exhibits no motion tenderness, no discharge and no friability. Right adnexum displays tenderness. Right adnexum displays no mass and no fullness. Left adnexum displays tenderness. Left adnexum displays no mass and no fullness.  Mild bilateral adnexal tenderness. Cervix normal without tenderness. Mild blood in the vaginal wall.   Musculoskeletal: Normal range of motion.  Neurological: She is alert and oriented to person, place,  and time.  Skin: Skin is warm and dry.  Psychiatric: She has a normal mood and affect. Her behavior is normal.    ED Course  Procedures (including critical care time)  DIAGNOSTIC STUDIES: Oxygen Saturation is 100% on room air, normal by my interpretation.    COORDINATION OF CARE: 6:17 PM- Will order UA. Discussed treatment plan with patient at bedside and patient verbalized agreement.     Labs Review Labs Reviewed  URINALYSIS, ROUTINE W REFLEX MICROSCOPIC - Abnormal; Notable for the following:    Color, Urine AMBER (*)    APPearance CLOUDY (*)    Hgb urine dipstick LARGE (*)    Ketones, ur 15 (*)    Protein, ur 100 (*)    Nitrite POSITIVE (*)    Leukocytes, UA SMALL (*)    All other components within normal limits  URINE MICROSCOPIC-ADD ON - Abnormal; Notable for the following:    Bacteria, UA MANY (*)    All other components within normal limits  GC/CHLAMYDIA PROBE AMP  WET PREP, GENITAL  URINE CULTURE  PREGNANCY, URINE   Imaging Review No results found.  EKG Interpretation   None       MDM   1. UTI (urinary tract infection)   2. BV (bacterial vaginosis)    Patient here with vaginal pain. She's also having some urinary urgency and lower abdominal discomfort. Denies systemic symptoms. Vitals are stable here. Exam shows mild bilateral adnexal tenderness without fullness. She has no discharge in her vagina. She did report discharged 2 days for appears started a few days ago. She's had an abortion recently. Labs show right UTI. Patient on Keflex for this. We'll also treat for STDs as she's had a new partner. Ceftriaxone and azithromycin given for this. Patient also has clue cells, to Flagyl. Instructed this could possibly be PID, however I believe most of her lower abdominal discomfort is secondary to her UTI. Her cervix was normal and had no redness or tenderness upon palpation. Structure to followup with PCP if symptoms worsen.  I personally performed the services  described in this documentation, which was scribed in my presence. The recorded information has been reviewed and is accurate.  Dagmar Hait, MD 07/23/13 [phone]

## 2013-07-23 NOTE — ED Notes (Signed)
No adverse effects noted to IM injection.  

## 2013-07-25 LAB — URINE CULTURE: Colony Count: >=100000

## 2013-07-25 LAB — GC/CHLAMYDIA PROBE AMP
CT Probe RNA: UNDETERMINED
GC Probe RNA: UNDETERMINED

## 2013-08-09 ENCOUNTER — Emergency Department (HOSPITAL_BASED_OUTPATIENT_CLINIC_OR_DEPARTMENT_OTHER)
Admission: EM | Admit: 2013-08-09 | Discharge: 2013-08-09 | Disposition: A | Discharge status: Home / Self Care | Payer: Medicaid Other | Attending: Emergency Medicine | Admitting: Emergency Medicine

## 2013-08-09 ENCOUNTER — Encounter (HOSPITAL_BASED_OUTPATIENT_CLINIC_OR_DEPARTMENT_OTHER): Payer: Self-pay | Admitting: Emergency Medicine

## 2013-08-09 DIAGNOSIS — Z79899 Other long term (current) drug therapy: Secondary | ICD-10-CM | POA: Insufficient documentation

## 2013-08-09 DIAGNOSIS — Z791 Long term (current) use of non-steroidal anti-inflammatories (NSAID): Secondary | ICD-10-CM | POA: Insufficient documentation

## 2013-08-09 DIAGNOSIS — L739 Follicular disorder, unspecified: Secondary | ICD-10-CM

## 2013-08-09 DIAGNOSIS — Z792 Long term (current) use of antibiotics: Secondary | ICD-10-CM | POA: Insufficient documentation

## 2013-08-09 DIAGNOSIS — IMO0002 Reserved for concepts with insufficient information to code with codable children: Secondary | ICD-10-CM | POA: Insufficient documentation

## 2013-08-09 DIAGNOSIS — Z8739 Personal history of other diseases of the musculoskeletal system and connective tissue: Secondary | ICD-10-CM | POA: Insufficient documentation

## 2013-08-09 DIAGNOSIS — L738 Other specified follicular disorders: Secondary | ICD-10-CM | POA: Insufficient documentation

## 2013-08-09 MED ORDER — NAPROXEN 500 MG PO TABS: 500.0000 mg | ORAL_TABLET | Freq: Two times a day (BID) | ORAL | Status: DC

## 2013-08-09 MED ORDER — DOXYCYCLINE HYCLATE 100 MG PO CAPS: 100.0000 mg | ORAL_CAPSULE | Freq: Two times a day (BID) | ORAL | Status: DC

## 2013-08-09 MED ORDER — HYDROCODONE-ACETAMINOPHEN 5-325 MG PO TABS: 1.0000 | ORAL_TABLET | ORAL | Status: DC | PRN

## 2013-08-09 NOTE — ED Provider Notes (Signed)
CSN: [id number]     Arrival date & time 08/09/13  1000 History   First MD Initiated Contact with Patient 08/09/13 1024     Chief Complaint  Patient presents with  . Urticaria    HPI Pt has some sore lesions on her right buttock.  She had a dep injection on the right buttock on Wednesday.  On Thursday she felt like her taste buds were irriated and she as told she had an allergic reaction.  She is on her last day of steroids. With that reaction she also noticed that she was having some desquamation of the skin on her fingertips. This was occurring when she was seen at the other emergency department. The symptoms seem to be improving.  The sore lesions on the right buttock area are new. She has not noticed any drainage. She has not had any fevers or chills. She denies any sores in her mouth difficulty breathing Past Medical History  Diagnosis Date  . Scoliosis   . Scoliosis    Past Surgical History  Procedure Laterality Date  . Therapeutic abortion     No family history on file. History  Substance Use Topics  . Smoking status: Never Smoker   . Smokeless tobacco: Never Used  . Alcohol Use: Yes   OB History   Grav Para Term Preterm Abortions TAB SAB Ect Mult Living   1              Review of Systems  All other systems reviewed and are negative.    Allergies  Review of patient's allergies indicates no known allergies.  Home Medications   Current Outpatient Rx  Name  Route  Sig  Dispense  Refill  . PredniSONE 10 MG KIT   Oral   Take by mouth.         . cephALEXin (KEFLEX) 500 MG capsule   Oral   Take 1 capsule (500 mg total) by mouth 4 (four) times daily.   20 capsule   0   . doxycycline (VIBRAMYCIN) 100 MG capsule   Oral   Take 1 capsule (100 mg total) by mouth 2 (two) times daily.   14 capsule   0   . HYDROcodone-acetaminophen (NORCO/VICODIN) 5-325 MG per tablet   Oral   Take 1-2 tablets by mouth every 4 (four) hours as needed.   16 tablet   0   .  ibuprofen (ADVIL,MOTRIN) 600 MG tablet   Oral   Take 1 tablet (600 mg total) by mouth every 6 (six) hours as needed for pain.   30 tablet   0   . metroNIDAZOLE (FLAGYL) 500 MG tablet   Oral   Take 1 tablet (500 mg total) by mouth 2 (two) times daily.   14 tablet   0   . Multiple Vitamins-Calcium (ONE-A-DAY WOMENS PO)   Oral   Take by mouth.         . naproxen (NAPROSYN) 500 MG tablet   Oral   Take 1 tablet (500 mg total) by mouth 2 (two) times daily.   30 tablet   0   . ondansetron (ZOFRAN ODT) 8 MG disintegrating tablet   Oral   Take 1 tablet (8 mg total) by mouth every 8 (eight) hours as needed for nausea.   20 tablet   0   . Prenatal Vit-Fe Fumarate-FA (PRENATAL MULTIVITAMIN) TABS tablet   Oral   Take 1 tablet by mouth daily at 12 noon.  BP 128/77  Pulse 80  Temp(Src) 98.4 F (36.9 C) (Oral)  Resp 18  Ht 5\' 6"  (1.676 m)  Wt 143 lb (64.864 kg)  BMI 23.09 kg/m2  SpO2 100%  LMP 04/11/2013 Physical Exam  Nursing note and vitals reviewed. Constitutional: She appears well-developed and well-nourished. No distress.  HENT:  Head: Normocephalic and atraumatic.  Right Ear: External ear normal.  Left Ear: External ear normal.  Mouth/Throat: Oropharynx is clear and moist and mucous membranes are normal.  Eyes: Conjunctivae are normal. Right eye exhibits no discharge. Left eye exhibits no discharge. No scleral icterus.  Neck: Neck supple. No tracheal deviation present.  Cardiovascular: Normal rate.   Pulmonary/Chest: Effort normal. No stridor. No respiratory distress.  Musculoskeletal: She exhibits no edema.  A few discrete areas of circular lesions with central excoriated papule on the right buttock and thigh, a couple lesions or 2 cm in diameter the largest lesion is maybe 4-5 cm, there is no fluctuance, there is induration  Neurological: She is alert. Cranial nerve deficit: no gross deficits.  Skin: Skin is warm and dry. No petechiae, no purpura and no  rash noted. Rash is not pustular and not vesicular.  Psychiatric: She has a normal mood and affect.    ED Course  Procedures (including critical care time) Labs Review Labs Reviewed - No data to display Imaging Review No results found.  EKG Interpretation   None      Bedside ED ultrasound over the indurated lesions did not show evidence of abscess or fluid collection MDM   1. Folliculitis     Patient does not have evidence of systemic symptoms. She has no mucosal cutaneous lesions. I doubt Stevens-Johnson syndrome. Is possible this rash could be related to her normal deformity but it also suggests the possibility of a folliculitis. However take doxycycline and have her follow up with her primary Dr.  Celene Kras, MD 08/09/13 [phone]

## 2013-08-09 NOTE — ED Notes (Signed)
Raised, reddened, painful areas on posterior aspect of right thigh that started Sunday.  Pt reports she was received a Depo injection Wednesday, seen by [facility] ED for "allergic reaction" Thursday and started on steroids and in ED today for areas on leg.

## 2013-10-06 ENCOUNTER — Encounter (HOSPITAL_BASED_OUTPATIENT_CLINIC_OR_DEPARTMENT_OTHER): Payer: Self-pay | Admitting: Emergency Medicine

## 2013-10-06 ENCOUNTER — Emergency Department (HOSPITAL_BASED_OUTPATIENT_CLINIC_OR_DEPARTMENT_OTHER)
Admission: EM | Admit: 2013-10-06 | Discharge: 2013-10-06 | Discharge status: Against Medical Advice | Payer: Medicaid Other | Attending: Emergency Medicine | Admitting: Emergency Medicine

## 2013-10-06 DIAGNOSIS — R05 Cough: Secondary | ICD-10-CM | POA: Insufficient documentation

## 2013-10-06 DIAGNOSIS — R6889 Other general symptoms and signs: Secondary | ICD-10-CM | POA: Insufficient documentation

## 2013-10-06 DIAGNOSIS — R059 Cough, unspecified: Secondary | ICD-10-CM | POA: Insufficient documentation

## 2013-10-06 DIAGNOSIS — R6883 Chills (without fever): Secondary | ICD-10-CM | POA: Insufficient documentation

## 2013-10-06 DIAGNOSIS — R52 Pain, unspecified: Secondary | ICD-10-CM | POA: Insufficient documentation

## 2013-10-06 DIAGNOSIS — J029 Acute pharyngitis, unspecified: Secondary | ICD-10-CM | POA: Insufficient documentation

## 2013-10-06 NOTE — ED Notes (Signed)
rec'd call from registration staff stating pt LWBS.

## 2013-10-06 NOTE — ED Notes (Addendum)
Body aches, runny nose, sore throat, chills and pain in her left ribs when she coughs.

## 2013-10-13 ENCOUNTER — Emergency Department (HOSPITAL_BASED_OUTPATIENT_CLINIC_OR_DEPARTMENT_OTHER)
Admission: EM | Admit: 2013-10-13 | Discharge: 2013-10-13 | Disposition: A | Discharge status: Home / Self Care | Payer: Medicaid Other | Attending: Emergency Medicine | Admitting: Emergency Medicine

## 2013-10-13 ENCOUNTER — Encounter (HOSPITAL_BASED_OUTPATIENT_CLINIC_OR_DEPARTMENT_OTHER): Payer: Self-pay | Admitting: Emergency Medicine

## 2013-10-13 DIAGNOSIS — L0231 Cutaneous abscess of buttock: Secondary | ICD-10-CM | POA: Insufficient documentation

## 2013-10-13 DIAGNOSIS — N390 Urinary tract infection, site not specified: Secondary | ICD-10-CM | POA: Insufficient documentation

## 2013-10-13 DIAGNOSIS — M4120 Other idiopathic scoliosis, site unspecified: Secondary | ICD-10-CM | POA: Insufficient documentation

## 2013-10-13 DIAGNOSIS — Z3202 Encounter for pregnancy test, result negative: Secondary | ICD-10-CM | POA: Insufficient documentation

## 2013-10-13 DIAGNOSIS — L03317 Cellulitis of buttock: Principal | ICD-10-CM

## 2013-10-13 DIAGNOSIS — Z79899 Other long term (current) drug therapy: Secondary | ICD-10-CM | POA: Insufficient documentation

## 2013-10-13 DIAGNOSIS — IMO0002 Reserved for concepts with insufficient information to code with codable children: Secondary | ICD-10-CM | POA: Insufficient documentation

## 2013-10-13 LAB — CBC WITH DIFFERENTIAL/PLATELET
BASOS ABS: 0 10*3/uL (ref 0.0–0.1)
Basophils Relative: 0 % (ref 0–1)
EOS PCT: 1 % (ref 0–5)
Eosinophils Absolute: 0.1 10*3/uL (ref 0.0–0.7)
HCT: 38.3 % (ref 36.0–46.0)
Hemoglobin: 12.9 g/dL (ref 12.0–15.0)
LYMPHS ABS: 1.5 10*3/uL (ref 0.7–4.0)
LYMPHS PCT: 12 % (ref 12–46)
MCH: 29.2 pg (ref 26.0–34.0)
MCHC: 33.7 g/dL (ref 30.0–36.0)
MCV: 86.7 fL (ref 78.0–100.0)
Monocytes Absolute: 1.1 10*3/uL — ABNORMAL HIGH (ref 0.1–1.0)
Monocytes Relative: 9 % (ref 3–12)
NEUTROS ABS: 9.2 10*3/uL — AB (ref 1.7–7.7)
Neutrophils Relative %: 77 % (ref 43–77)
Platelets: 197 10*3/uL (ref 150–400)
RBC: 4.42 MIL/uL (ref 3.87–5.11)
RDW: 12.8 % (ref 11.5–15.5)
WBC: 11.8 10*3/uL — AB (ref 4.0–10.5)

## 2013-10-13 LAB — URINALYSIS, ROUTINE W REFLEX MICROSCOPIC
Glucose, UA: NEGATIVE mg/dL
Ketones, ur: 15 mg/dL — AB
Nitrite: NEGATIVE
Protein, ur: 30 mg/dL — AB
Specific Gravity, Urine: 1.031 — ABNORMAL HIGH (ref 1.005–1.030)
UROBILINOGEN UA: 4 mg/dL — AB (ref 0.0–1.0)
pH: 7.5 (ref 5.0–8.0)

## 2013-10-13 LAB — URINE MICROSCOPIC-ADD ON

## 2013-10-13 LAB — PREGNANCY, URINE: PREG TEST UR: NEGATIVE

## 2013-10-13 MED ORDER — SULFAMETHOXAZOLE-TRIMETHOPRIM 800-160 MG PO TABS: 1.0000 | ORAL_TABLET | Freq: Two times a day (BID) | ORAL | Status: DC

## 2013-10-13 MED ORDER — HYDROCODONE-ACETAMINOPHEN 5-325 MG PO TABS: 1.0000 | ORAL_TABLET | ORAL | Status: DC | PRN

## 2013-10-13 NOTE — ED Provider Notes (Signed)
Medical screening examination/treatment/procedure(s) were performed by non-physician practitioner and as supervising physician I was immediately available for consultation/collaboration.  EKG Interpretation   None         Dontavis Tschantz, MD 10/13/13 2328 

## 2013-10-13 NOTE — ED Notes (Signed)
Pt presents with multiple complaints. Pt states that she has abscess to R buttock.  Pt also c/o vaginal bleeding after getting depo injection (1st dose).  Pt states that she has generalized body aching, and bumps on her R groin.

## 2013-10-13 NOTE — ED Provider Notes (Signed)
CSN: [id number]     Arrival date & time 10/13/13  1720 History   First MD Initiated Contact with Patient 10/13/13 1737     Chief Complaint  Patient presents with  . Generalized Body Aches  . Abscess     (Consider location/radiation/quality/duration/timing/severity/associated sxs/prior Treatment) HPI Comments: Patient here with multiple complaints but most center around the right buttock abscess which she states started about 1 week ago - since then she has felt fatigued and has also noted a lump in her right groin as well.  She states generalized body aches and states that she has had irregular periods since starting the depo-provera.  She denies fever, but reports chills.  Denies drainage from the area but reports worsening swelling.  Patient is a 28 y.o. female presenting with abscess. The history is provided by the patient. No language interpreter was used.  Abscess Location:  Ano-genital Ano-genital abscess location:  R buttock Size:  5cm Abscess quality: fluctuance, induration, painful, redness and warmth   Abscess quality: not draining   Red streaking: no   Duration:  1 week Progression:  Worsening Pain details:    Quality:  Sharp and pressure   Severity:  Moderate   Timing:  Constant   Progression:  Worsening Chronicity:  New Context: not diabetes, not immunosuppression, not insect bite/sting and not skin injury   Relieved by:  Nothing Worsened by:  Nothing tried Ineffective treatments:  None tried Associated symptoms: fatigue   Associated symptoms: no fever, no headaches, no nausea and no vomiting     Past Medical History  Diagnosis Date  . Scoliosis   . Scoliosis    Past Surgical History  Procedure Laterality Date  . Therapeutic abortion     History reviewed. No pertinent family history. History  Substance Use Topics  . Smoking status: Never Smoker   . Smokeless tobacco: Never Used  . Alcohol Use: Yes   OB History   Grav Para Term Preterm Abortions TAB  SAB Ect Mult Living   1              Review of Systems  Constitutional: Positive for fatigue. Negative for fever.  Gastrointestinal: Negative for nausea and vomiting.  Neurological: Negative for headaches.  All other systems reviewed and are negative.      Allergies  Review of patient's allergies indicates no known allergies.  Home Medications   Current Outpatient Rx  Name  Route  Sig  Dispense  Refill  . ibuprofen (ADVIL,MOTRIN) 600 MG tablet   Oral   Take 1 tablet (600 mg total) by mouth every 6 (six) hours as needed for pain.   30 tablet   0   . Multiple Vitamins-Calcium (ONE-A-DAY WOMENS PO)   Oral   Take by mouth.         . cephALEXin (KEFLEX) 500 MG capsule   Oral   Take 1 capsule (500 mg total) by mouth 4 (four) times daily.   20 capsule   0   . doxycycline (VIBRAMYCIN) 100 MG capsule   Oral   Take 1 capsule (100 mg total) by mouth 2 (two) times daily.   14 capsule   0   . HYDROcodone-acetaminophen (NORCO/VICODIN) 5-325 MG per tablet   Oral   Take 1-2 tablets by mouth every 4 (four) hours as needed.   16 tablet   0   . metroNIDAZOLE (FLAGYL) 500 MG tablet   Oral   Take 1 tablet (500 mg total) by mouth 2 (two)  times daily.   14 tablet   0   . naproxen (NAPROSYN) 500 MG tablet   Oral   Take 1 tablet (500 mg total) by mouth 2 (two) times daily.   30 tablet   0   . ondansetron (ZOFRAN ODT) 8 MG disintegrating tablet   Oral   Take 1 tablet (8 mg total) by mouth every 8 (eight) hours as needed for nausea.   20 tablet   0   . PredniSONE 10 MG KIT   Oral   Take by mouth.         . Prenatal Vit-Fe Fumarate-FA (PRENATAL MULTIVITAMIN) TABS tablet   Oral   Take 1 tablet by mouth daily at 12 noon.          BP 124/80  Pulse 102  Temp(Src) 99.4 F (37.4 C) (Oral)  Resp 12  Ht '5\' 7"'  (1.702 m)  Wt 143 lb (64.864 kg)  BMI 22.39 kg/m2  SpO2 100%  LMP 07/22/2013 Physical Exam  Nursing note and vitals reviewed. Constitutional: She is  oriented to person, place, and time. She appears well-developed and well-nourished. No distress.  HENT:  Head: Normocephalic and atraumatic.  Mouth/Throat: Oropharynx is clear and moist.  Eyes: Conjunctivae are normal. No scleral icterus.  Pulmonary/Chest: Effort normal.  Abdominal: Soft. Bowel sounds are normal. She exhibits no distension. There is no tenderness. There is no rebound and no guarding.  Lymphadenopathy:       Right: Inguinal adenopathy present.  Neurological: She is alert and oriented to person, place, and time. She exhibits normal muscle tone. Coordination normal.  Skin: Skin is warm and dry. No rash noted. There is erythema.  Right buttock with 7 cm area of induration and 3 cm area of fluctuance, surrounding cellulitis also noted.  Psychiatric: She has a normal mood and affect. Her behavior is normal. Judgment and thought content normal.    ED Course  Procedures (including critical care time) Labs Review Labs Reviewed  URINALYSIS, ROUTINE W REFLEX MICROSCOPIC - Abnormal; Notable for the following:    Color, Urine AMBER (*)    Specific Gravity, Urine 1.031 (*)    Hgb urine dipstick LARGE (*)    Bilirubin Urine SMALL (*)    Ketones, ur 15 (*)    Protein, ur 30 (*)    Urobilinogen, UA 4.0 (*)    Leukocytes, UA TRACE (*)    All other components within normal limits  CBC WITH DIFFERENTIAL - Abnormal; Notable for the following:    WBC 11.8 (*)    Neutro Abs 9.2 (*)    Monocytes Absolute 1.1 (*)    All other components within normal limits  URINE MICROSCOPIC-ADD ON - Abnormal; Notable for the following:    Bacteria, UA MANY (*)    All other components within normal limits  PREGNANCY, URINE   Imaging Review No results found.  EKG Interpretation   None      INCISION AND DRAINAGE Performed by: Ignacia Felling C. Consent: Verbal consent obtained. Risks and benefits: risks, benefits and alternatives were discussed Type: abscess  Body area: right  buttock  Anesthesia: local infiltration  Incision was made with a scalpel.  Local anesthetic: lidocaine 2% with epinephrine  Anesthetic total: 3 ml  Complexity: complex Blunt dissection to break up loculations  Drainage: purulent  Drainage amount: large  Packing material: 1/4 in iodoform gauze  Patient tolerance: Patient tolerated the procedure well with no immediate complications.    MDM   Right buttock abscess UTI  Patient here with multiple complaints mainly related to the abscess.  There is slight leukocytosis but Hgb is normal.  She is running low grade fever here and this is likely the cause of the body aches.  Because of the surrounding cellulitis, I will place the patient on antibiotics.  She will return in 2 days for packing removal and re-check of the wound.     Idalia Needle Joelyn Oms, [place] 10/13/13 1922

## 2013-10-13 NOTE — Discharge Instructions (Signed)
Abscess An abscess is an infected area that contains a collection of pus and debris.It can occur in almost any part of the body. An abscess is also known as a furuncle or boil. CAUSES  An abscess occurs when tissue gets infected. This can occur from blockage of oil or sweat glands, infection of hair follicles, or a minor injury to the skin. As the body tries to fight the infection, pus collects in the area and creates pressure under the skin. This pressure causes pain. People with weakened immune systems have difficulty fighting infections and get certain abscesses more often.  SYMPTOMS Usually an abscess develops on the skin and becomes a painful mass that is red, warm, and tender. If the abscess forms under the skin, you may feel a moveable soft area under the skin. Some abscesses break open (rupture) on their own, but most will continue to get worse without care. The infection can spread deeper into the body and eventually into the bloodstream, causing you to feel ill.  DIAGNOSIS  Your caregiver will take your medical history and perform a physical exam. A sample of fluid may also be taken from the abscess to determine what is causing your infection. TREATMENT  Your caregiver may prescribe antibiotic medicines to fight the infection. However, taking antibiotics alone usually does not cure an abscess. Your caregiver may need to make a small cut (incision) in the abscess to drain the pus. In some cases, gauze is packed into the abscess to reduce pain and to continue draining the area. HOME CARE INSTRUCTIONS   Only take over-the-counter or prescription medicines for pain, discomfort, or fever as directed by your caregiver.  If you were prescribed antibiotics, take them as directed. Finish them even if you start to feel better.  If gauze is used, follow your caregiver's directions for changing the gauze.  To avoid spreading the infection:  Keep your draining abscess covered with a  bandage.  Wash your hands well.  Do not share personal care items, towels, or whirlpools with others.  Avoid skin contact with others.  Keep your skin and clothes clean around the abscess.  Keep all follow-up appointments as directed by your caregiver. SEEK MEDICAL CARE IF:   You have increased pain, swelling, redness, fluid drainage, or bleeding.  You have muscle aches, chills, or a general ill feeling.  You have a fever. MAKE SURE YOU:   Understand these instructions.  Will watch your condition.  Will get help right away if you are not doing well or get worse. Document Released: 05/20/2005 Document Revised: 02/09/2012 Document Reviewed: 10/23/2011 [facility] Patient Information 2014 [place], [place].  Antibiotic Medication Antibiotics are among the most frequently prescribed medicines. Antibiotics cure illness by assisting our body to injure or kill the bacteria that cause infection. While antibiotics are useful to treat a wide variety of infections they are useless against viruses. Antibiotics cannot cure colds, flu, or other viral infections.  There are many types of antibiotics available. Your caregiver will decide which antibiotic will be useful for an illness. Never take or give someone else's antibiotics or left over medicine. Your caregiver may also take into account:  Allergies.  The cost of the medicine.  Dosing schedules.  Taste.  Common side effects when choosing an antibiotic for an infection. Ask your caregiver if you have questions about why a certain medicine was chosen. HOME CARE INSTRUCTIONS Read all instructions and labels on medicine bottles carefully. Some antibiotics should be taken on an empty stomach  while others should be taken with food. Taking antibiotics incorrectly may reduce how well they work. Some antibiotics need to be kept in the refrigerator. Others should be kept at room temperature. Ask your caregiver or pharmacist if you do not  understand how to give the medicine. Be sure to give the amount of medicine your caregiver has prescribed. Even if you feel better and your symptoms improve, bacteria may still remain alive in the body. Taking all of the medicine will prevent:  The infection from returning and becoming harder to treat.  Complications from partially treated infections. If there is any medicine left over after you have taken the medicine as your caregiver has instructed, throw the medicine away. Be sure to tell your caregiver if you:  Are allergic to any medicines.  Are pregnant or intend to become pregnant while using this medicine.  Are breastfeeding.  Are taking any other prescription, non-prescription medicine, or herbal remedies.  Have any other medical conditions or problems you have not already discussed. If you are taking birth control pills, they may not work while you are on antibiotics. To avoid unwanted pregnancy:  Continue taking your birth control pills as usual.  Use a second form of birth control (such as condoms) while you are taking antibiotic medicine.  When you finish taking the antibiotic medicine, continue using the second form of birth control until you are finished with your current 1 month cycle of birth control pills. Try not to miss any doses of medicine. If you miss a dose, take it as soon as possible. However, if it is almost time for the next dose and the dosing schedule is:  2 doses a day, take the missed dose and the next dose 5 to 6 hours apart.  3 or more doses a day, take the missed dose and the next dose 2 to 4 hours apart, then go back to the normal schedule.  If you are unable to make up a missed dose, take the next scheduled dose on time and complete the missed dose at the end of the prescribed time for your medicine. SIDE EFFECTS TO TAKING ANTIBIOTICS Common side effects to antibiotic use include:  Soft stools or diarrhea.  Mild stomach upset.  Sun  sensitivity. SEEK MEDICAL CARE IF:   If you get worse or do not improve within a few days of starting the medicine.  Vomiting develops.  Diaper rash or rash on the genitals appears.  Vaginal itching occurs.  White patches appear on the tongue or in the mouth.  Severe watery diarrhea and abdominal cramps occur.  Signs of an allergy develop (hives, unknown itchy rash appears). STOP TAKING THE ANTIBIOTIC. SEEK IMMEDIATE MEDICAL CARE IF:   Urine turns dark or blood colored.  Skin turns yellow.  Easy bruising or bleeding occurs.  Joint pain or muscle aches occur.  Fever returns.  Severe headache occurs.  Signs of an allergy develop (trouble breathing, wheezing, swelling of the lips, face or tongue, fainting, or blisters on the skin or in the mouth). STOP TAKING THE ANTIBIOTIC. Document Released: 04/22/2004 Document Revised: 11/02/2011 Document Reviewed: 05/02/2009 [facility] Patient Information 2014 [place], [place].  Cellulitis Cellulitis is an infection of the skin and the tissue beneath it. The infected area is usually red and tender. Cellulitis occurs most often in the arms and lower legs.  CAUSES  Cellulitis is caused by bacteria that enter the skin through cracks or cuts in the skin. The most common types of bacteria that  cause cellulitis are Staphylococcus and Streptococcus. SYMPTOMS   Redness and warmth.  Swelling.  Tenderness or pain.  Fever. DIAGNOSIS  Your caregiver can usually determine what is wrong based on a physical exam. Blood tests may also be done. TREATMENT  Treatment usually involves taking an antibiotic medicine. HOME CARE INSTRUCTIONS   Take your antibiotics as directed. Finish them even if you start to feel better.  Keep the infected arm or leg elevated to reduce swelling.  Apply a warm cloth to the affected area up to 4 times per day to relieve pain.  Only take over-the-counter or prescription medicines for pain, discomfort, or fever as  directed by your caregiver.  Keep all follow-up appointments as directed by your caregiver. SEEK MEDICAL CARE IF:   You notice red streaks coming from the infected area.  Your red area gets larger or turns dark in color.  Your bone or joint underneath the infected area becomes painful after the skin has healed.  Your infection returns in the same area or another area.  You notice a swollen bump in the infected area.  You develop new symptoms. SEEK IMMEDIATE MEDICAL CARE IF:   You have a fever.  You feel very sleepy.  You develop vomiting or diarrhea.  You have a general ill feeling (malaise) with muscle aches and pains. MAKE SURE YOU:   Understand these instructions.  Will watch your condition.  Will get help right away if you are not doing well or get worse. Document Released: 05/20/2005 Document Revised: 02/09/2012 Document Reviewed: 10/26/2011 [facility] Patient Information 2014 [place]  Urinary Tract Infection Urinary tract infections (UTIs) can develop anywhere along your urinary tract. Your urinary tract is your body's drainage system for removing wastes and extra water. Your urinary tract includes two kidneys, two ureters, a bladder, and a urethra. Your kidneys are a pair of bean-shaped organs. Each kidney is about the size of your fist. They are located below your ribs, one on each side of your spine. CAUSES Infections are caused by microbes, which are microscopic organisms, including fungi, viruses, and bacteria. These organisms are so small that they can only be seen through a microscope. Bacteria are the microbes that most commonly cause UTIs. SYMPTOMS  Symptoms of UTIs may vary by age and gender of the patient and by the location of the infection. Symptoms in young women typically include a frequent and intense urge to urinate and a painful, burning feeling in the bladder or urethra during urination. Older women and men are more likely to be tired, shaky,  and weak and have muscle aches and abdominal pain. A fever may mean the infection is in your kidneys. Other symptoms of a kidney infection include pain in your back or sides below the ribs, nausea, and vomiting. DIAGNOSIS To diagnose a UTI, your caregiver will ask you about your symptoms. Your caregiver also will ask to provide a urine sample. The urine sample will be tested for bacteria and white blood cells. White blood cells are made by your body to help fight infection. TREATMENT  Typically, UTIs can be treated with medication. Because most UTIs are caused by a bacterial infection, they usually can be treated with the use of antibiotics. The choice of antibiotic and length of treatment depend on your symptoms and the type of bacteria causing your infection. HOME CARE INSTRUCTIONS  If you were prescribed antibiotics, take them exactly as your caregiver instructs you. Finish the medication even if you feel better after you  have only taken some of the medication.  Drink enough water and fluids to keep your urine clear or pale yellow.  Avoid caffeine, tea, and carbonated beverages. They tend to irritate your bladder.  Empty your bladder often. Avoid holding urine for long periods of time.  Empty your bladder before and after sexual intercourse.  After a bowel movement, women should cleanse from front to back. Use each tissue only once. SEEK MEDICAL CARE IF:   You have back pain.  You develop a fever.  Your symptoms do not begin to resolve within 3 days. SEEK IMMEDIATE MEDICAL CARE IF:   You have severe back pain or lower abdominal pain.  You develop chills.  You have nausea or vomiting.  You have continued burning or discomfort with urination. MAKE SURE YOU:   Understand these instructions.  Will watch your condition.  Will get help right away if you are not doing well or get worse. Document Released: 05/20/2005 Document Revised: 02/09/2012 Document Reviewed:  09/18/2011 [facility] Patient Information 2014 [place], [place].

## 2013-10-15 ENCOUNTER — Encounter (HOSPITAL_BASED_OUTPATIENT_CLINIC_OR_DEPARTMENT_OTHER): Payer: Self-pay | Admitting: Emergency Medicine

## 2013-10-15 ENCOUNTER — Emergency Department (HOSPITAL_BASED_OUTPATIENT_CLINIC_OR_DEPARTMENT_OTHER)
Admission: EM | Admit: 2013-10-15 | Discharge: 2013-10-15 | Disposition: A | Discharge status: Home / Self Care | Payer: Medicaid Other | Attending: Emergency Medicine | Admitting: Emergency Medicine

## 2013-10-15 DIAGNOSIS — Z8739 Personal history of other diseases of the musculoskeletal system and connective tissue: Secondary | ICD-10-CM | POA: Insufficient documentation

## 2013-10-15 DIAGNOSIS — L0231 Cutaneous abscess of buttock: Secondary | ICD-10-CM | POA: Insufficient documentation

## 2013-10-15 DIAGNOSIS — Z791 Long term (current) use of non-steroidal anti-inflammatories (NSAID): Secondary | ICD-10-CM | POA: Insufficient documentation

## 2013-10-15 DIAGNOSIS — Z79899 Other long term (current) drug therapy: Secondary | ICD-10-CM | POA: Insufficient documentation

## 2013-10-15 DIAGNOSIS — L03317 Cellulitis of buttock: Principal | ICD-10-CM

## 2013-10-15 DIAGNOSIS — Z792 Long term (current) use of antibiotics: Secondary | ICD-10-CM | POA: Insufficient documentation

## 2013-10-15 DIAGNOSIS — IMO0002 Reserved for concepts with insufficient information to code with codable children: Secondary | ICD-10-CM | POA: Insufficient documentation

## 2013-10-15 NOTE — ED Notes (Signed)
Patient here for a recheck of a boil that was packed on Friday. Still having some serosanguinous drainage. Patient states that the area has improved.

## 2013-10-15 NOTE — ED Provider Notes (Signed)
History/physical exam/procedure(s) were performed by non-physician practitioner and as supervising physician I was immediately available for consultation/collaboration. I have reviewed all notes and am in agreement with care and plan.   Hilario Quarryanielle S Tamma Brigandi, MD 10/15/13 [phone]

## 2013-10-15 NOTE — Discharge Instructions (Signed)
Abscess °An abscess is an infected area that contains a collection of pus and debris. It can occur in almost any part of the body. An abscess is also known as a furuncle or boil. °CAUSES  °An abscess occurs when tissue gets infected. This can occur from blockage of oil or sweat glands, infection of hair follicles, or a minor injury to the skin. As the body tries to fight the infection, pus collects in the area and creates pressure under the skin. This pressure causes pain. People with weakened immune systems have difficulty fighting infections and get certain abscesses more often.  °SYMPTOMS °Usually an abscess develops on the skin and becomes a painful mass that is red, warm, and tender. If the abscess forms under the skin, you may feel a moveable soft area under the skin. Some abscesses break open (rupture) on their own, but most will continue to get worse without care. The infection can spread deeper into the body and eventually into the bloodstream, causing you to feel ill.  °DIAGNOSIS  °Your caregiver will take your medical history and perform a physical exam. A sample of fluid may also be taken from the abscess to determine what is causing your infection. °TREATMENT  °Your caregiver may prescribe antibiotic medicines to fight the infection. However, taking antibiotics alone usually does not cure an abscess. Your caregiver may need to make a small cut (incision) in the abscess to drain the pus. In some cases, gauze is packed into the abscess to reduce pain and to continue draining the area. °HOME CARE INSTRUCTIONS  °· Only take over-the-counter or prescription medicines for pain, discomfort, or fever as directed by your caregiver. °· If you were prescribed antibiotics, take them as directed. Finish them even if you start to feel better. °· If gauze is used, follow your caregiver's directions for changing the gauze. °· To avoid spreading the infection: °· Keep your draining abscess covered with a  bandage. °· Wash your hands well. °· Do not share personal care items, towels, or whirlpools with others. °· Avoid skin contact with others. °· Keep your skin and clothes clean around the abscess. °· Keep all follow-up appointments as directed by your caregiver. °SEEK MEDICAL CARE IF:  °· You have increased pain, swelling, redness, fluid drainage, or bleeding. °· You have muscle aches, chills, or a general ill feeling. °· You have a fever. °MAKE SURE YOU:  °· Understand these instructions. °· Will watch your condition. °· Will get help right away if you are not doing well or get worse. °Document Released: 05/20/2005 Document Revised: 02/09/2012 Document Reviewed: 10/23/2011 °[employer]® Patient Information ©2014 [employer]. ° °Abscess °Care After °An abscess (also called a boil or furuncle) is an infected area that contains a collection of pus. Signs and symptoms of an abscess include pain, tenderness, redness, or hardness, or you may feel a moveable soft area under your skin. An abscess can occur anywhere in the body. The infection may spread to surrounding tissues causing cellulitis. A cut (incision) by the surgeon was made over your abscess and the pus was drained out. Gauze may have been packed into the space to provide a drain that will allow the cavity to heal from the inside outwards. The boil may be painful for 5 to 7 days. Most people with a boil do not have high fevers. Your abscess, if seen early, may not have localized, and may not have been lanced. If not, another appointment may be required for this if it does   not get better on its own or with medications. °HOME CARE INSTRUCTIONS  °· Only take over-the-counter or prescription medicines for pain, discomfort, or fever as directed by your caregiver. °· When you bathe, soak and then remove gauze or iodoform packs at least daily or as directed by your caregiver. You may then wash the wound gently with mild soapy water. Repack with gauze or do as your  caregiver directs. °SEEK IMMEDIATE MEDICAL CARE IF:  °· You develop increased pain, swelling, redness, drainage, or bleeding in the wound site. °· You develop signs of generalized infection including muscle aches, chills, fever, or a general ill feeling. °· An oral temperature above 102° F (38.9° C) develops, not controlled by medication. °See your caregiver for a recheck if you develop any of the symptoms described above. If medications (antibiotics) were prescribed, take them as directed. °Document Released: 02/26/2005 Document Revised: 11/02/2011 Document Reviewed: 10/24/2007 °[employer]® Patient Information ©2014 [employer]. ° °

## 2013-10-15 NOTE — ED Provider Notes (Signed)
CSN: [id number]     Arrival date & time 10/15/13  1554 History   First MD Initiated Contact with Patient 10/15/13 1607     Chief Complaint  Patient presents with  . Wound Check     (Consider location/radiation/quality/duration/timing/severity/associated sxs/prior Treatment) HPI Comments: Patient here for re-check of right buttock abscess that I&D'd on Friday.  She reports the packing came out on it's own yesterday and reports moderate serosangenous drainage from the wound since then.  She reports no further fever, chills, body aches or fatigue.  Patient is a 28 y.o. female presenting with wound check. The history is provided by the patient. No language interpreter was used.  Wound Check This is a new problem. The current episode started in the past 7 days. The problem occurs constantly. The problem has been gradually improving. Pertinent negatives include no abdominal pain, chest pain, congestion, coughing, fatigue, fever, headaches, joint swelling, nausea, rash, vomiting or weakness. Nothing aggravates the symptoms. She has tried nothing for the symptoms. The treatment provided no relief.    Past Medical History  Diagnosis Date  . Scoliosis   . Scoliosis    Past Surgical History  Procedure Laterality Date  . Therapeutic abortion     No family history on file. History  Substance Use Topics  . Smoking status: Never Smoker   . Smokeless tobacco: Never Used  . Alcohol Use: Yes   OB History   Grav Para Term Preterm Abortions TAB SAB Ect Mult Living   1              Review of Systems  Constitutional: Negative for fever and fatigue.  HENT: Negative for congestion.   Respiratory: Negative for cough.   Cardiovascular: Negative for chest pain.  Gastrointestinal: Negative for nausea, vomiting and abdominal pain.  Musculoskeletal: Negative for joint swelling.  Skin: Negative for rash.  Neurological: Negative for weakness and headaches.  All other systems reviewed and are  negative.      Allergies  Review of patient's allergies indicates no known allergies.  Home Medications   Current Outpatient Rx  Name  Route  Sig  Dispense  Refill  . cephALEXin (KEFLEX) 500 MG capsule   Oral   Take 1 capsule (500 mg total) by mouth 4 (four) times daily.   20 capsule   0   . doxycycline (VIBRAMYCIN) 100 MG capsule   Oral   Take 1 capsule (100 mg total) by mouth 2 (two) times daily.   14 capsule   0   . HYDROcodone-acetaminophen (NORCO/VICODIN) 5-325 MG per tablet   Oral   Take 1-2 tablets by mouth every 4 (four) hours as needed.   16 tablet   0   . HYDROcodone-acetaminophen (NORCO/VICODIN) 5-325 MG per tablet   Oral   Take 1 tablet by mouth every 4 (four) hours as needed.   20 tablet   0   . ibuprofen (ADVIL,MOTRIN) 600 MG tablet   Oral   Take 1 tablet (600 mg total) by mouth every 6 (six) hours as needed for pain.   30 tablet   0   . metroNIDAZOLE (FLAGYL) 500 MG tablet   Oral   Take 1 tablet (500 mg total) by mouth 2 (two) times daily.   14 tablet   0   . Multiple Vitamins-Calcium (ONE-A-DAY WOMENS PO)   Oral   Take by mouth.         . naproxen (NAPROSYN) 500 MG tablet   Oral   Take  1 tablet (500 mg total) by mouth 2 (two) times daily.   30 tablet   0   . ondansetron (ZOFRAN ODT) 8 MG disintegrating tablet   Oral   Take 1 tablet (8 mg total) by mouth every 8 (eight) hours as needed for nausea.   20 tablet   0   . PredniSONE 10 MG KIT   Oral   Take by mouth.         . Prenatal Vit-Fe Fumarate-FA (PRENATAL MULTIVITAMIN) TABS tablet   Oral   Take 1 tablet by mouth daily at 12 noon.         . sulfamethoxazole-trimethoprim (SEPTRA DS) 800-160 MG per tablet   Oral   Take 1 tablet by mouth every 12 (twelve) hours.   10 tablet   0    BP 100/68  Pulse 84  Temp(Src) 98.4 F (36.9 C) (Oral)  Resp 19  Ht '5\' 7"'  (1.702 m)  Wt 140 lb (63.504 kg)  BMI 21.92 kg/m2  SpO2 100%  LMP 07/22/2013 Physical Exam  Nursing note  and vitals reviewed. Constitutional: She is oriented to person, place, and time. She appears well-developed and well-nourished. No distress.  HENT:  Head: Normocephalic and atraumatic.  Mouth/Throat: Oropharynx is clear and moist.  Eyes: Conjunctivae are normal. No scleral icterus.  Pulmonary/Chest: Effort normal.  Musculoskeletal: Normal range of motion. She exhibits no edema and no tenderness.  Neurological: She is alert and oriented to person, place, and time. She exhibits normal muscle tone. Coordination normal.  Skin: Skin is warm and dry. There is erythema.  Still 2cm induration noted to right buttock but wound is improving since initial abscess drained.  Packing out, small amount of serosangenous drainage noted.  Psychiatric: She has a normal mood and affect. Her behavior is normal. Judgment and thought content normal.    ED Course  Procedures (including critical care time) Labs Review Labs Reviewed - No data to display Imaging Review No results found.  EKG Interpretation   None       MDM   Final diagnoses:  Abscess of right buttock    Patient here for re-check of right buttock wound, marked improvement since initial I&D, will continue with warm soaks and taking the abx.  Will return if needed.    Idalia Needle Joelyn Oms, [place] 10/15/13 1637

## 2014-03-09 ENCOUNTER — Emergency Department (HOSPITAL_BASED_OUTPATIENT_CLINIC_OR_DEPARTMENT_OTHER)
Admission: EM | Admit: 2014-03-09 | Discharge: 2014-03-09 | Disposition: A | Discharge status: Home / Self Care | Payer: Medicaid Other | Attending: Emergency Medicine | Admitting: Emergency Medicine

## 2014-03-09 ENCOUNTER — Encounter (HOSPITAL_BASED_OUTPATIENT_CLINIC_OR_DEPARTMENT_OTHER): Payer: Self-pay | Admitting: Emergency Medicine

## 2014-03-09 DIAGNOSIS — R350 Frequency of micturition: Secondary | ICD-10-CM | POA: Diagnosis present

## 2014-03-09 DIAGNOSIS — Z79899 Other long term (current) drug therapy: Secondary | ICD-10-CM | POA: Insufficient documentation

## 2014-03-09 DIAGNOSIS — Z3202 Encounter for pregnancy test, result negative: Secondary | ICD-10-CM | POA: Insufficient documentation

## 2014-03-09 DIAGNOSIS — IMO0002 Reserved for concepts with insufficient information to code with codable children: Secondary | ICD-10-CM | POA: Insufficient documentation

## 2014-03-09 DIAGNOSIS — N390 Urinary tract infection, site not specified: Secondary | ICD-10-CM | POA: Diagnosis not present

## 2014-03-09 DIAGNOSIS — M4120 Other idiopathic scoliosis, site unspecified: Secondary | ICD-10-CM | POA: Insufficient documentation

## 2014-03-09 DIAGNOSIS — R109 Unspecified abdominal pain: Secondary | ICD-10-CM | POA: Diagnosis not present

## 2014-03-09 DIAGNOSIS — Z792 Long term (current) use of antibiotics: Secondary | ICD-10-CM | POA: Insufficient documentation

## 2014-03-09 LAB — URINALYSIS, ROUTINE W REFLEX MICROSCOPIC
BILIRUBIN URINE: NEGATIVE
Glucose, UA: NEGATIVE mg/dL
Ketones, ur: NEGATIVE mg/dL
NITRITE: NEGATIVE
Specific Gravity, Urine: 1.03 (ref 1.005–1.030)
UROBILINOGEN UA: 1 mg/dL (ref 0.0–1.0)
pH: 6 (ref 5.0–8.0)

## 2014-03-09 LAB — PREGNANCY, URINE: Preg Test, Ur: NEGATIVE

## 2014-03-09 LAB — URINE MICROSCOPIC-ADD ON

## 2014-03-09 MED ORDER — CIPROFLOXACIN HCL 500 MG PO TABS: 500.0000 mg | ORAL_TABLET | Freq: Once | ORAL | Status: DC

## 2014-03-09 MED ORDER — CIPROFLOXACIN HCL 500 MG PO TABS: 500.0000 mg | ORAL_TABLET | Freq: Two times a day (BID) | ORAL | Status: DC

## 2014-03-09 NOTE — ED Notes (Signed)
Patient was found to have a UTI at [facility] a couple of weeks ago and was treated. The patient reports that she has frequent UTIs and now having frequency and pressure with urination

## 2014-03-09 NOTE — Discharge Instructions (Signed)

## 2014-03-09 NOTE — ED Provider Notes (Signed)
CSN: [id number]     Arrival date & time 03/09/14  0017 History   First MD Initiated Contact with Patient 03/09/14 0053     Chief Complaint  Patient presents with  . Urinary Frequency     (Consider location/radiation/quality/duration/timing/severity/associated sxs/prior Treatment) HPI Comments: Patient presents with symptoms concerning for a UTI. She states over the last 2 days she's had some pressure feeling at the end of her urinary stream. She feels like she can't completely empty her bladder. She's had some discomfort across her bladder area. She denies any back pain. She has no nausea or vomiting. She has no fevers or chills. Her symptoms have worsened a little over last 2 days. She does have a history of frequent UTIs since the birth of her child 2 years ago. She was recently treated about 3 weeks ago with Bactrim. She's followed by Dr. Micah Noel, her OB/GYN in [facility]. She denies any vaginal discharge. She has had some intermittent spotting since her Depo shot was given a few months ago.  Patient is a 28 y.o. female presenting with frequency.  Urinary Frequency Associated symptoms include abdominal pain (mild suprapubic discomfort). Pertinent negatives include no chest pain, no headaches and no shortness of breath.    Past Medical History  Diagnosis Date  . Scoliosis   . Scoliosis    Past Surgical History  Procedure Laterality Date  . Therapeutic abortion     History reviewed. No pertinent family history. History  Substance Use Topics  . Smoking status: Never Smoker   . Smokeless tobacco: Never Used  . Alcohol Use: Yes   OB History   Grav Para Term Preterm Abortions TAB SAB Ect Mult Living   1              Review of Systems  Constitutional: Negative for fever, chills, diaphoresis and fatigue.  HENT: Negative for congestion, rhinorrhea and sneezing.   Eyes: Negative.   Respiratory: Negative for cough, chest tightness and shortness of breath.   Cardiovascular: Negative  for chest pain and leg swelling.  Gastrointestinal: Positive for abdominal pain (mild suprapubic discomfort). Negative for nausea, vomiting, diarrhea and blood in stool.  Genitourinary: Positive for dysuria and frequency. Negative for hematuria, flank pain and difficulty urinating.  Musculoskeletal: Negative for arthralgias and back pain.  Skin: Negative for rash.  Neurological: Negative for dizziness, speech difficulty, weakness, numbness and headaches.      Allergies  Review of patient's allergies indicates no known allergies.  Home Medications   Prior to Admission medications   Medication Sig Start Date End Date Taking? Authorizing Provider  cephALEXin (KEFLEX) 500 MG capsule Take 1 capsule (500 mg total) by mouth 4 (four) times daily. 07/23/13   Osvaldo Shipper, MD  ciprofloxacin (CIPRO) 500 MG tablet Take 1 tablet (500 mg total) by mouth 2 (two) times daily. One po bid x 7 days 03/09/14   Malvin Johns, MD  doxycycline (VIBRAMYCIN) 100 MG capsule Take 1 capsule (100 mg total) by mouth 2 (two) times daily. 08/09/13   Dorie Rank, MD  HYDROcodone-acetaminophen (NORCO/VICODIN) 5-325 MG per tablet Take 1-2 tablets by mouth every 4 (four) hours as needed. 08/09/13   Dorie Rank, MD  HYDROcodone-acetaminophen (NORCO/VICODIN) 5-325 MG per tablet Take 1 tablet by mouth every 4 (four) hours as needed. 10/13/13   Idalia Needle. [place], PA-C  ibuprofen (ADVIL,MOTRIN) 600 MG tablet Take 1 tablet (600 mg total) by mouth every 6 (six) hours as needed for pain. 01/14/13   April K  Palumbo-Rasch, MD  metroNIDAZOLE (FLAGYL) 500 MG tablet Take 1 tablet (500 mg total) by mouth 2 (two) times daily. 07/23/13   Osvaldo Shipper, MD  Multiple Vitamins-Calcium (ONE-A-DAY WOMENS PO) Take by mouth.    Historical Provider, MD  naproxen (NAPROSYN) 500 MG tablet Take 1 tablet (500 mg total) by mouth 2 (two) times daily. 08/09/13   Dorie Rank, MD  ondansetron (ZOFRAN ODT) 8 MG disintegrating tablet Take 1 tablet (8 mg  total) by mouth every 8 (eight) hours as needed for nausea. 06/05/13   Varney Biles, MD  PredniSONE 10 MG KIT Take by mouth.    Historical Provider, MD  Prenatal Vit-Fe Fumarate-FA (PRENATAL MULTIVITAMIN) TABS tablet Take 1 tablet by mouth daily at 12 noon.    Historical Provider, MD  sulfamethoxazole-trimethoprim (SEPTRA DS) 800-160 MG per tablet Take 1 tablet by mouth every 12 (twelve) hours. 10/13/13   Idalia Needle. [place], PA-C   BP 125/79  Pulse 79  Temp(Src) 98 F (36.7 C) (Oral)  Resp 16  SpO2 100% Physical Exam  Constitutional: She is oriented to person, place, and time. She appears well-developed and well-nourished.  HENT:  Head: Normocephalic and atraumatic.  Eyes: Pupils are equal, round, and reactive to light.  Neck: Normal range of motion. Neck supple.  Cardiovascular: Normal rate, regular rhythm and normal heart sounds.   Pulmonary/Chest: Effort normal and breath sounds normal. No respiratory distress. She has no wheezes. She has no rales. She exhibits no tenderness.  Abdominal: Soft. Bowel sounds are normal. There is no tenderness. There is no rebound and no guarding.  No CVA tenderness  Musculoskeletal: Normal range of motion. She exhibits no edema.  Lymphadenopathy:    She has no cervical adenopathy.  Neurological: She is alert and oriented to person, place, and time.  Skin: Skin is warm and dry. No rash noted.  Psychiatric: She has a normal mood and affect.    ED Course  Procedures (including critical care time) Labs Review Results for orders placed during the hospital encounter of 03/09/14  URINALYSIS, ROUTINE W REFLEX MICROSCOPIC      Result Value Ref Range   Color, Urine AMBER (*) YELLOW   APPearance CLOUDY (*) CLEAR   Specific Gravity, Urine 1.030  1.005 - 1.030   pH 6.0  5.0 - 8.0   Glucose, UA NEGATIVE  NEGATIVE mg/dL   Hgb urine dipstick LARGE (*) NEGATIVE   Bilirubin Urine NEGATIVE  NEGATIVE   Ketones, ur NEGATIVE  NEGATIVE mg/dL   Protein, ur >300  (*) NEGATIVE mg/dL   Urobilinogen, UA 1.0  0.0 - 1.0 mg/dL   Nitrite NEGATIVE  NEGATIVE   Leukocytes, UA SMALL (*) NEGATIVE  PREGNANCY, URINE      Result Value Ref Range   Preg Test, Ur NEGATIVE  NEGATIVE  URINE MICROSCOPIC-ADD ON      Result Value Ref Range   Squamous Epithelial / LPF MANY (*) RARE   WBC, UA 21-50  <3 WBC/hpf   RBC / HPF 21-50  <3 RBC/hpf   Bacteria, UA MANY (*) RARE   Urine-Other MUCOUS PRESENT     No results found.    Imaging Review No results found.   EKG Interpretation None      MDM   Final diagnoses:  UTI (lower urinary tract infection)    Patient with UTI. She has no vaginal symptoms. There's no congestions or pyelonephritis. She was discharged home in good condition. She was given a prescription for Cipro. She was encouraged to  followup with her OB/GYN. Her urine was sent for culture.    Malvin Johns, MD 03/09/14 [phone]

## 2014-03-09 NOTE — ED Notes (Signed)
Vag bleeding x767months.  Is on Depo.  Saw GYN x2 in June and was told everything was normal

## 2014-03-10 LAB — URINE CULTURE
COLONY COUNT: NO GROWTH
CULTURE: NO GROWTH

## 2014-06-25 ENCOUNTER — Encounter (HOSPITAL_BASED_OUTPATIENT_CLINIC_OR_DEPARTMENT_OTHER): Payer: Self-pay | Admitting: Emergency Medicine

## 2014-08-01 ENCOUNTER — Encounter (HOSPITAL_BASED_OUTPATIENT_CLINIC_OR_DEPARTMENT_OTHER): Payer: Self-pay | Admitting: Emergency Medicine

## 2014-08-01 ENCOUNTER — Emergency Department (HOSPITAL_BASED_OUTPATIENT_CLINIC_OR_DEPARTMENT_OTHER)
Admission: EM | Admit: 2014-08-01 | Discharge: 2014-08-01 | Disposition: A | Discharge status: Home / Self Care | Payer: Medicaid Other | Attending: Emergency Medicine | Admitting: Emergency Medicine

## 2014-08-01 DIAGNOSIS — Z79899 Other long term (current) drug therapy: Secondary | ICD-10-CM | POA: Diagnosis not present

## 2014-08-01 DIAGNOSIS — Z792 Long term (current) use of antibiotics: Secondary | ICD-10-CM | POA: Insufficient documentation

## 2014-08-01 DIAGNOSIS — K0381 Cracked tooth: Secondary | ICD-10-CM | POA: Insufficient documentation

## 2014-08-01 DIAGNOSIS — M419 Scoliosis, unspecified: Secondary | ICD-10-CM | POA: Insufficient documentation

## 2014-08-01 DIAGNOSIS — K088 Other specified disorders of teeth and supporting structures: Secondary | ICD-10-CM | POA: Diagnosis present

## 2014-08-01 DIAGNOSIS — Z7952 Long term (current) use of systemic steroids: Secondary | ICD-10-CM | POA: Insufficient documentation

## 2014-08-01 DIAGNOSIS — K029 Dental caries, unspecified: Secondary | ICD-10-CM | POA: Diagnosis not present

## 2014-08-01 MED ORDER — PENICILLIN V POTASSIUM 250 MG PO TABS
500.0000 mg | ORAL_TABLET | Freq: Once | ORAL | Status: AC
  Administered 2014-08-01: 500 mg via ORAL
  Filled 2014-08-01: qty 2

## 2014-08-01 MED ORDER — HYDROCODONE-ACETAMINOPHEN 5-325 MG PO TABS: 1.0000 | ORAL_TABLET | Freq: Four times a day (QID) | ORAL | Status: DC | PRN

## 2014-08-01 MED ORDER — NAPROXEN 250 MG PO TABS
500.0000 mg | ORAL_TABLET | Freq: Once | ORAL | Status: AC
  Administered 2014-08-01: 500 mg via ORAL
  Filled 2014-08-01: qty 2

## 2014-08-01 MED ORDER — PENICILLIN V POTASSIUM 500 MG PO TABS: 500.0000 mg | ORAL_TABLET | Freq: Four times a day (QID) | ORAL | Status: AC

## 2014-08-01 NOTE — Discharge Instructions (Signed)
Dental Care and Dentist Visits  Dental care supports good overall health. Regular dental visits can also help you avoid dental pain, bleeding, infection, and other more serious health problems in the future. It is important to keep the mouth healthy because diseases in the teeth, gums, and other oral tissues can spread to other areas of the body. Some problems, such as diabetes, heart disease, and pre-term labor have been associated with poor oral health.   See your dentist every 6 months. If you experience emergency problems such as a toothache or broken tooth, go to the dentist right away. If you see your dentist regularly, you may catch problems early. It is easier to be treated for problems in the early stages.   WHAT TO EXPECT AT A DENTIST VISIT   Your dentist will look for many common oral health problems and recommend proper treatment. At your regular dental visit, you can expect:  · Gentle cleaning of the teeth and gums. This includes scraping and polishing. This helps to remove the sticky substance around the teeth and gums (plaque). Plaque forms in the mouth shortly after eating. Over time, plaque hardens on the teeth as tartar. If tartar is not removed regularly, it can cause problems. Cleaning also helps remove stains.  · Periodic X-rays. These pictures of the teeth and supporting bone will help your dentist assess the health of your teeth.  · Periodic fluoride treatments. Fluoride is a natural mineral shown to help strengthen teeth. Fluoride treatment involves applying a fluoride gel or varnish to the teeth. It is most commonly done in children.  · Examination of the mouth, tongue, jaws, teeth, and gums to look for any oral health problems, such as:  ¨ Cavities (dental caries). This is decay on the tooth caused by plaque, sugar, and acid in the mouth. It is best to catch a cavity when it is small.  ¨ Inflammation of the gums caused by plaque buildup (gingivitis).  ¨ Problems with the mouth or malformed  or misaligned teeth.  ¨ Oral cancer or other diseases of the soft tissues or jaws.   KEEP YOUR TEETH AND GUMS HEALTHY  For healthy teeth and gums, follow these general guidelines as well as your dentist's specific advice:  · Have your teeth professionally cleaned at the dentist every 6 months.  · Brush twice daily with a fluoride toothpaste.  · Floss your teeth daily.   · Ask your dentist if you need fluoride supplements, treatments, or fluoride toothpaste.  · Eat a healthy diet. Reduce foods and drinks with added sugar.  · Avoid smoking.  TREATMENT FOR ORAL HEALTH PROBLEMS  If you have oral health problems, treatment varies depending on the conditions present in your teeth and gums.  · Your caregiver will most likely recommend good oral hygiene at each visit.  · For cavities, gingivitis, or other oral health disease, your caregiver will perform a procedure to treat the problem. This is typically done at a separate appointment. Sometimes your caregiver will refer you to another dental specialist for specific tooth problems or for surgery.  SEEK IMMEDIATE DENTAL CARE IF:  · You have pain, bleeding, or soreness in the gum, tooth, jaw, or mouth area.  · A permanent tooth becomes loose or separated from the gum socket.  · You experience a blow or injury to the mouth or jaw area.  Document Released: 04/22/2011 Document Revised: 11/02/2011 Document Reviewed: 04/22/2011  Exit[employer]® Patient Information ©2015 [employer]. This information is not intended to replace advice   given to you by your health care provider. Make sure you discuss any questions you have with your health care provider.

## 2014-08-01 NOTE — ED Provider Notes (Signed)
CSN: [id number]     Arrival date & time 08/01/14  0306 History   First MD Initiated Contact with Patient 08/01/14 0353     Chief Complaint  Patient presents with  . Dental Pain     (Consider location/radiation/quality/duration/timing/severity/associated sxs/prior Treatment) Patient is a 28 y.o. female presenting with tooth pain. The history is provided by the patient.  Dental Pain Location:  Lower Lower teeth location:  30/RL 1st molar Quality:  Dull Severity:  Severe Onset quality:  Sudden Timing:  Constant Progression:  Unchanged Chronicity:  New Context: dental fracture   Previous work-up:  Dental exam Relieved by:  Nothing Worsened by:  Nothing tried Ineffective treatments:  None tried Associated symptoms: no drooling, no neck swelling and no trismus   Risk factors: no smoking     Past Medical History  Diagnosis Date  . Scoliosis   . Scoliosis    Past Surgical History  Procedure Laterality Date  . Therapeutic abortion     History reviewed. No pertinent family history. History  Substance Use Topics  . Smoking status: Never Smoker   . Smokeless tobacco: Never Used  . Alcohol Use: Yes   OB History    Gravida Para Term Preterm AB TAB SAB Ectopic Multiple Living   1              Review of Systems  HENT: Negative for drooling.   All other systems reviewed and are negative.     Allergies  Review of patient's allergies indicates no known allergies.  Home Medications   Prior to Admission medications   Medication Sig Start Date End Date Taking? Authorizing Provider  cephALEXin (KEFLEX) 500 MG capsule Take 1 capsule (500 mg total) by mouth 4 (four) times daily. 07/23/13   Evelina Bucy, MD  ciprofloxacin (CIPRO) 500 MG tablet Take 1 tablet (500 mg total) by mouth 2 (two) times daily. One po bid x 7 days 03/09/14   Malvin Johns, MD  doxycycline (VIBRAMYCIN) 100 MG capsule Take 1 capsule (100 mg total) by mouth 2 (two) times daily. 08/09/13   Dorie Rank, MD    HYDROcodone-acetaminophen (NORCO/VICODIN) 5-325 MG per tablet Take 1-2 tablets by mouth every 4 (four) hours as needed. 08/09/13   Dorie Rank, MD  HYDROcodone-acetaminophen (NORCO/VICODIN) 5-325 MG per tablet Take 1 tablet by mouth every 4 (four) hours as needed. 10/13/13   Idalia Needle. Sanford, PA-C  ibuprofen (ADVIL,MOTRIN) 600 MG tablet Take 1 tablet (600 mg total) by mouth every 6 (six) hours as needed for pain. 01/14/13   Willowdean Luhmann K Earnest Thalman-Rasch, MD  metroNIDAZOLE (FLAGYL) 500 MG tablet Take 1 tablet (500 mg total) by mouth 2 (two) times daily. 07/23/13   Evelina Bucy, MD  Multiple Vitamins-Calcium (ONE-A-DAY WOMENS PO) Take by mouth.    Historical Provider, MD  naproxen (NAPROSYN) 500 MG tablet Take 1 tablet (500 mg total) by mouth 2 (two) times daily. 08/09/13   Dorie Rank, MD  ondansetron (ZOFRAN ODT) 8 MG disintegrating tablet Take 1 tablet (8 mg total) by mouth every 8 (eight) hours as needed for nausea. 06/05/13   Varney Biles, MD  PredniSONE 10 MG KIT Take by mouth.    Historical Provider, MD  Prenatal Vit-Fe Fumarate-FA (PRENATAL MULTIVITAMIN) TABS tablet Take 1 tablet by mouth daily at 12 noon.    Historical Provider, MD  sulfamethoxazole-trimethoprim (SEPTRA DS) 800-160 MG per tablet Take 1 tablet by mouth every 12 (twelve) hours. 10/13/13   Idalia Needle. Sanford, PA-C   BP 114/73 mmHg  Pulse 77  Temp(Src) 98.2 F (36.8 C) (Oral)  Resp 18  Ht _0  (1.702 m)  Wt 138 lb (62.596 kg)  BMI 21.61 kg/m2  SpO2 100%  LMP 07/18/2014 Physical Exam  Constitutional: She is oriented to person, place, and time. She appears well-developed and well-nourished. No distress.  HENT:  Head: Normocephalic and atraumatic.  Mouth/Throat: Oropharynx is clear and moist.    Eyes: Conjunctivae are normal. Pupils are equal, round, and reactive to light.  Neck: Normal range of motion. Neck supple.  Cardiovascular: Normal rate, regular rhythm and intact distal pulses.   Pulmonary/Chest: Effort normal and  breath sounds normal. She has no wheezes. She has no rales.  Abdominal: Soft. Bowel sounds are normal. There is no tenderness.  Musculoskeletal: Normal range of motion.  Lymphadenopathy:    She has no cervical adenopathy.  Neurological: She is alert and oriented to person, place, and time.  Skin: Skin is warm and dry.  Psychiatric: She has a normal mood and affect.    ED Course  Procedures (including critical care time) Labs Review Labs Reviewed - No data to display  Imaging Review No results found.   EKG Interpretation None      MDM   Final diagnoses:  None    Penicillin and pain medication and a referral to dentist for ongoing care.      Carlisle Beers, MD 08/01/14 [phone]

## 2014-08-01 NOTE — ED Notes (Signed)
Pt reports her right bottom back tooth broke several days ago, developed pain tonight, has appt with dentist in 2 weeks

## 2014-08-02 ENCOUNTER — Encounter (HOSPITAL_BASED_OUTPATIENT_CLINIC_OR_DEPARTMENT_OTHER): Payer: Self-pay | Admitting: *Deleted

## 2014-08-02 ENCOUNTER — Emergency Department (HOSPITAL_BASED_OUTPATIENT_CLINIC_OR_DEPARTMENT_OTHER)
Admission: EM | Admit: 2014-08-02 | Discharge: 2014-08-02 | Disposition: A | Discharge status: Home / Self Care | Payer: Medicaid Other | Attending: Emergency Medicine | Admitting: Emergency Medicine

## 2014-08-02 DIAGNOSIS — Z792 Long term (current) use of antibiotics: Secondary | ICD-10-CM | POA: Insufficient documentation

## 2014-08-02 DIAGNOSIS — Z3202 Encounter for pregnancy test, result negative: Secondary | ICD-10-CM | POA: Insufficient documentation

## 2014-08-02 DIAGNOSIS — R109 Unspecified abdominal pain: Secondary | ICD-10-CM | POA: Diagnosis present

## 2014-08-02 DIAGNOSIS — M545 Low back pain, unspecified: Secondary | ICD-10-CM

## 2014-08-02 DIAGNOSIS — M419 Scoliosis, unspecified: Secondary | ICD-10-CM | POA: Insufficient documentation

## 2014-08-02 DIAGNOSIS — N760 Acute vaginitis: Secondary | ICD-10-CM | POA: Insufficient documentation

## 2014-08-02 DIAGNOSIS — Z79899 Other long term (current) drug therapy: Secondary | ICD-10-CM | POA: Diagnosis not present

## 2014-08-02 DIAGNOSIS — B9689 Other specified bacterial agents as the cause of diseases classified elsewhere: Secondary | ICD-10-CM

## 2014-08-02 LAB — URINALYSIS, ROUTINE W REFLEX MICROSCOPIC
Glucose, UA: NEGATIVE mg/dL
HGB URINE DIPSTICK: NEGATIVE
KETONES UR: 15 mg/dL — AB
LEUKOCYTES UA: NEGATIVE
Nitrite: NEGATIVE
PH: 5.5 (ref 5.0–8.0)
Protein, ur: NEGATIVE mg/dL
Specific Gravity, Urine: 1.028 (ref 1.005–1.030)
Urobilinogen, UA: 1 mg/dL (ref 0.0–1.0)

## 2014-08-02 LAB — PREGNANCY, URINE: Preg Test, Ur: NEGATIVE

## 2014-08-02 MED ORDER — METRONIDAZOLE 500 MG PO TABS
500.0000 mg | ORAL_TABLET | Freq: Once | ORAL | Status: AC
  Administered 2014-08-02: 500 mg via ORAL
  Filled 2014-08-02: qty 1

## 2014-08-02 MED ORDER — NAPROXEN 500 MG PO TABS: 500.0000 mg | ORAL_TABLET | Freq: Two times a day (BID) | ORAL | Status: DC

## 2014-08-02 MED ORDER — IBUPROFEN 800 MG PO TABS
800.0000 mg | ORAL_TABLET | Freq: Once | ORAL | Status: AC
  Administered 2014-08-02: 800 mg via ORAL
  Filled 2014-08-02: qty 1

## 2014-08-02 MED ORDER — CYCLOBENZAPRINE HCL 10 MG PO TABS
10.0000 mg | ORAL_TABLET | Freq: Once | ORAL | Status: AC
  Administered 2014-08-02: 10 mg via ORAL
  Filled 2014-08-02: qty 1

## 2014-08-02 MED ORDER — METRONIDAZOLE 500 MG PO TABS: 500.0000 mg | ORAL_TABLET | Freq: Two times a day (BID) | ORAL | Status: DC

## 2014-08-02 MED ORDER — ORPHENADRINE CITRATE ER 100 MG PO TB12: 100.0000 mg | ORAL_TABLET | Freq: Two times a day (BID) | ORAL | Status: DC

## 2014-08-02 NOTE — ED Notes (Signed)
Pt c/o nausea x 1 week,  Denies nausea at present  Left flank pain onset at 330 this pm  States pain 9/10 when standing  And 3/10 when lying  States is having some vag dc,  Was treated for same w cream it went away and then returned

## 2014-08-02 NOTE — Discharge Instructions (Signed)
Back Pain, Adult Low back pain is very common. About 1 in 5 people have back pain.The cause of low back pain is rarely dangerous. The pain often gets better over time.About half of people with a sudden onset of back pain feel better in just 2 weeks. About 8 in 10 people feel better by 6 weeks.  CAUSES Some common causes of back pain include:  Strain of the muscles or ligaments supporting the spine.  Wear and tear (degeneration) of the spinal discs.  Arthritis.  Direct injury to the back. DIAGNOSIS Most of the time, the direct cause of low back pain is not known.However, back pain can be treated effectively even when the exact cause of the pain is unknown.Answering your caregiver's questions about your overall health and symptoms is one of the most accurate ways to make sure the cause of your pain is not dangerous. If your caregiver needs more information, he or she may order lab work or imaging tests (X-rays or MRIs).However, even if imaging tests show changes in your back, this usually does not require surgery. HOME CARE INSTRUCTIONS For many people, back pain returns.Since low back pain is rarely dangerous, it is often a condition that people can learn to [facility] their own.   Remain active. It is stressful on the back to sit or stand in one place. Do not sit, drive, or stand in one place for more than 30 minutes at a time. Take short walks on level surfaces as soon as pain allows.Try to increase the length of time you walk each day.  Do not stay in bed.Resting more than 1 or 2 days can delay your recovery.  Do not avoid exercise or work.Your body is made to move.It is not dangerous to be active, even though your back may hurt.Your back will likely heal faster if you return to being active before your pain is gone.  Pay attention to your body when you bend and lift. Many people have less discomfortwhen lifting if they bend their knees, keep the load close to their bodies,and  avoid twisting. Often, the most comfortable positions are those that put less stress on your recovering back.  Find a comfortable position to sleep. Use a firm mattress and lie on your side with your knees slightly bent. If you lie on your back, put a pillow under your knees.  Only take over-the-counter or prescription medicines as directed by your caregiver. Over-the-counter medicines to reduce pain and inflammation are often the most helpful.Your caregiver may prescribe muscle relaxant drugs.These medicines help dull your pain so you can more quickly return to your normal activities and healthy exercise.  Put ice on the injured area.  Put ice in a plastic bag.  Place a towel between your skin and the bag.  Leave the ice on for 15-20 minutes, 03-04 times a day for the first 2 to 3 days. After that, ice and heat may be alternated to reduce pain and spasms.  Ask your caregiver about trying back exercises and gentle massage. This may be of some benefit.  Avoid feeling anxious or stressed.Stress increases muscle tension and can worsen back pain.It is important to recognize when you are anxious or stressed and learn ways to manage it.Exercise is a great option. SEEK MEDICAL CARE IF:  You have pain that is not relieved with rest or medicine.  You have pain that does not improve in 1 week.  You have new symptoms.  You are generally not feeling well. SEEK  IMMEDIATE MEDICAL CARE IF:  °· You have pain that radiates from your back into your legs. °· You develop new bowel or bladder control problems. °· You have unusual weakness or numbness in your arms or legs. °· You develop nausea or vomiting. °· You develop abdominal pain. °· You feel faint. °Document Released: 08/10/2005 Document Revised: 02/09/2012 Document Reviewed: 12/12/2013 °ExitCare® Patient Information ©2015 [employer]. This information is not intended to replace advice given to you by your health care provider. Make sure you  discuss any questions you have with your health care provider. ° °Bacterial Vaginosis °Bacterial vaginosis is a vaginal infection that occurs when the normal balance of bacteria in the vagina is disrupted. It results from an overgrowth of certain bacteria. This is the most common vaginal infection in women of childbearing age. Treatment is important to prevent complications, especially in pregnant women, as it can cause a premature delivery. °CAUSES  °Bacterial vaginosis is caused by an increase in harmful bacteria that are normally present in smaller amounts in the vagina. Several different kinds of bacteria can cause bacterial vaginosis. However, the reason that the condition develops is not fully understood. °RISK FACTORS °Certain activities or behaviors can put you at an increased risk of developing bacterial vaginosis, including: °· Having a new sex partner or multiple sex partners. °· Douching. °· Using an intrauterine device (IUD) for contraception. °Women do not get bacterial vaginosis from toilet seats, bedding, swimming pools, or contact with objects around them. °SIGNS AND SYMPTOMS  °Some women with bacterial vaginosis have no signs or symptoms. Common symptoms include: °· Grey vaginal discharge. °· A fishlike odor with discharge, especially after sexual intercourse. °· Itching or burning of the vagina and vulva. °· Burning or pain with urination. °DIAGNOSIS  °Your health care provider will take a medical history and examine the vagina for signs of bacterial vaginosis. A sample of vaginal fluid may be taken. Your health care provider will look at this sample under a microscope to check for bacteria and abnormal cells. A vaginal pH test may also be done.  °TREATMENT  °Bacterial vaginosis may be treated with antibiotic medicines. These may be given in the form of a pill or a vaginal cream. A second round of antibiotics may be prescribed if the condition comes back after treatment.  °HOME CARE INSTRUCTIONS    °· Only take over-the-counter or prescription medicines as directed by your health care provider. °· If antibiotic medicine was prescribed, take it as directed. Make sure you finish it even if you start to feel better. °· Do not have sex until treatment is completed. °· Tell all sexual partners that you have a vaginal infection. They should see their health care provider and be treated if they have problems, such as a mild rash or itching. °· Practice safe sex by using condoms and only having one sex partner. °SEEK MEDICAL CARE IF:  °· Your symptoms are not improving after 3 days of treatment. °· You have increased discharge or pain. °· You have a fever. °MAKE SURE YOU:  °· Understand these instructions. °· Will watch your condition. °· Will get help right away if you are not doing well or get worse. °FOR MORE INFORMATION  °Centers for Disease Control and Prevention, Division of STD Prevention: www.cdc.gov/std °American Sexual Health Association (ASHA): www.ashastd.org  °Document Released: 08/10/2005 Document Revised: 05/31/2013 Document Reviewed: 03/22/2013 °ExitCare® Patient Information ©2015 [employer]. This information is not intended to replace advice given to you by your health   care provider. Make sure you discuss any questions you have with your health care provider.  Metronidazole tablets or capsules What is this medicine? METRONIDAZOLE (me troe NI da zole) is an antiinfective. It is used to treat certain kinds of bacterial and protozoal infections. It will not work for colds, flu, or other viral infections. This medicine may be used for other purposes; ask your health care provider or pharmacist if you have questions. COMMON BRAND NAME(S): Flagyl What should I tell my health care provider before I take this medicine? They need to know if you have any of these conditions: -anemia or other blood disorders -disease of the nervous system -fungal or yeast infection -if you drink alcohol  containing drinks -liver disease -seizures -an unusual or allergic reaction to metronidazole, or other medicines, foods, dyes, or preservatives -pregnant or trying to get pregnant -breast-feeding How should I use this medicine? Take this medicine by mouth with a full glass of water. Follow the directions on the prescription label. Take your medicine at regular intervals. Do not take your medicine more often than directed. Take all of your medicine as directed even if you think you are better. Do not skip doses or stop your medicine early. Talk to your pediatrician regarding the use of this medicine in children. Special care may be needed. Overdosage: If you think you have taken too much of this medicine contact a poison control center or emergency room at once. NOTE: This medicine is only for you. Do not share this medicine with others. What if I miss a dose? If you miss a dose, take it as soon as you can. If it is almost time for your next dose, take only that dose. Do not take double or extra doses. What may interact with this medicine? Do not take this medicine with any of the following medications: -alcohol or any product that contains alcohol -amprenavir oral solution -cisapride -disulfiram -dofetilide -dronedarone -paclitaxel injection -pimozide -ritonavir oral solution -sertraline oral solution -sulfamethoxazole-trimethoprim injection -thioridazine -ziprasidone This medicine may also interact with the following medications: -birth control pills -cimetidine -lithium -other medicines that prolong the QT interval (cause an abnormal heart rhythm) -phenobarbital -phenytoin -warfarin This list may not describe all possible interactions. Give your health care provider a list of all the medicines, herbs, non-prescription drugs, or dietary supplements you use. Also tell them if you smoke, drink alcohol, or use illegal drugs. Some items may interact with your medicine. What should I  watch for while using this medicine? Tell your doctor or health care professional if your symptoms do not improve or if they get worse. You may get drowsy or dizzy. Do not drive, use machinery, or do anything that needs mental alertness until you know how this medicine affects you. Do not stand or sit up quickly, especially if you are an older patient. This reduces the risk of dizzy or fainting spells. Avoid alcoholic drinks while you are taking this medicine and for three days afterward. Alcohol may make you feel dizzy, sick, or flushed. If you are being treated for a sexually transmitted disease, avoid sexual contact until you have finished your treatment. Your sexual partner may also need treatment. What side effects may I notice from receiving this medicine? Side effects that you should report to your doctor or health care professional as soon as possible: -allergic reactions like skin rash or hives, swelling of the face, lips, or tongue -confusion, clumsiness -difficulty speaking -discolored or sore mouth -dizziness -fever, infection -numbness, tingling,  pain or weakness in the hands or feet -trouble passing urine or change in the amount of urine -redness, blistering, peeling or loosening of the skin, including inside the mouth -seizures -unusually weak or tired -vaginal irritation, dryness, or discharge Side effects that usually do not require medical attention (report to your doctor or health care professional if they continue or are bothersome): -diarrhea -headache -irritability -metallic taste -nausea -stomach pain or cramps -trouble sleeping This list may not describe all possible side effects. Call your doctor for medical advice about side effects. You may report side effects to FDA at 1-800-FDA-1088. Where should I keep my medicine? Keep out of the reach of children. Store at room temperature below 25 degrees C (77 degrees F). Protect from light. Keep container tightly  closed. Throw away any unused medicine after the expiration date. NOTE: This sheet is a summary. It may not cover all possible information. If you have questions about this medicine, talk to your doctor, pharmacist, or health care provider.  2015, Elsevier/Gold Standard. (2013-03-17 14:08:39)  Naproxen and naproxen sodium oral immediate-release tablets What is this medicine? NAPROXEN (na PROX en) is a non-steroidal anti-inflammatory drug (NSAID). It is used to reduce swelling and to treat pain. This medicine may be used for dental pain, headache, or painful monthly periods. It is also used for painful joint and muscular problems such as arthritis, tendinitis, bursitis, and gout. This medicine may be used for other purposes; ask your health care provider or pharmacist if you have questions. COMMON BRAND NAME(S): Aflaxen, Aleve, Aleve Arthritis, All Day Relief, Anaprox, Anaprox DS, Naprosyn What should I tell my health care provider before I take this medicine? They need to know if you have any of these conditions: -asthma -cigarette smoker -drink more than 3 alcohol containing drinks a day -heart disease or circulation problems such as heart failure or leg edema (fluid retention) -high blood pressure -kidney disease -liver disease -stomach bleeding or ulcers -an unusual or allergic reaction to naproxen, aspirin, other NSAIDs, other medicines, foods, dyes, or preservatives -pregnant or trying to get pregnant -breast-feeding How should I use this medicine? Take this medicine by mouth with a glass of water. Follow the directions on the prescription label. Take it with food if your stomach gets upset. Try to not lie down for at least 10 minutes after you take it. Take your medicine at regular intervals. Do not take your medicine more often than directed. Long-term, continuous use may increase the risk of heart attack or stroke. A special MedGuide will be given to you by the pharmacist with each  prescription and refill. Be sure to read this information carefully each time. Talk to your pediatrician regarding the use of this medicine in children. Special care may be needed. Overdosage: If you think you have taken too much of this medicine contact a poison control center or emergency room at once. NOTE: This medicine is only for you. Do not share this medicine with others. What if I miss a dose? If you miss a dose, take it as soon as you can. If it is almost time for your next dose, take only that dose. Do not take double or extra doses. What may interact with this medicine? -alcohol -aspirin -cidofovir -diuretics -lithium -methotrexate -other drugs for inflammation like ketorolac or prednisone -pemetrexed -probenecid -warfarin This list may not describe all possible interactions. Give your health care provider a list of all the medicines, herbs, non-prescription drugs, or dietary supplements you use. Also tell them  if you smoke, drink alcohol, or use illegal drugs. Some items may interact with your medicine. What should I watch for while using this medicine? Tell your doctor or health care professional if your pain does not get better. Talk to your doctor before taking another medicine for pain. Do not treat yourself. This medicine does not prevent heart attack or stroke. In fact, this medicine may increase the chance of a heart attack or stroke. The chance may increase with longer use of this medicine and in people who have heart disease. If you take aspirin to prevent heart attack or stroke, talk with your doctor or health care professional. Do not take other medicines that contain aspirin, ibuprofen, or naproxen with this medicine. Side effects such as stomach upset, nausea, or ulcers may be more likely to occur. Many medicines available without a prescription should not be taken with this medicine. This medicine can cause ulcers and bleeding in the stomach and intestines at any time  during treatment. Do not smoke cigarettes or drink alcohol. These increase irritation to your stomach and can make it more susceptible to damage from this medicine. Ulcers and bleeding can happen without warning symptoms and can cause death. You may get drowsy or dizzy. Do not drive, use machinery, or do anything that needs mental alertness until you know how this medicine affects you. Do not stand or sit up quickly, especially if you are an older patient. This reduces the risk of dizzy or fainting spells. This medicine can cause you to bleed more easily. Try to avoid damage to your teeth and gums when you brush or floss your teeth. What side effects may I notice from receiving this medicine? Side effects that you should report to your doctor or health care professional as soon as possible: -black or bloody stools, blood in the urine or vomit -blurred vision -chest pain -difficulty breathing or wheezing -nausea or vomiting -severe stomach pain -skin rash, skin redness, blistering or peeling skin, hives, or itching -slurred speech or weakness on one side of the body -swelling of eyelids, throat, lips -unexplained weight gain or swelling -unusually weak or tired -yellowing of eyes or skin Side effects that usually do not require medical attention (report to your doctor or health care professional if they continue or are bothersome): -constipation -headache -heartburn This list may not describe all possible side effects. Call your doctor for medical advice about side effects. You may report side effects to FDA at 1-800-FDA-1088. Where should I keep my medicine? Keep out of the reach of children. Store at room temperature between 15 and 30 degrees C (59 and 86 degrees F). Keep container tightly closed. Throw away any unused medicine after the expiration date. NOTE: This sheet is a summary. It may not cover all possible information. If you have questions about this medicine, talk to your doctor,  pharmacist, or health care provider.  2015, Elsevier/Gold Standard. (2009-08-12 20:10:16)  Orphenadrine tablets What is this medicine? ORPHENADRINE (or FEN a dreen) helps to relieve pain and stiffness in muscles and can treat muscle spasms. This medicine may be used for other purposes; ask your health care provider or pharmacist if you have questions. COMMON BRAND NAME(S): Norflex What should I tell my health care provider before I take this medicine? They need to know if you have any of these conditions: -glaucoma -heart disease -kidney disease -myasthenia gravis -peptic ulcer disease -prostate disease -stomach problems -an unusual or allergic reaction to orphenadrine, other medicines, foods, lactose, dyes,  or preservatives -pregnant or trying to get pregnant -breast-feeding How should I use this medicine? Take this medicine by mouth with a full glass of water. Follow the directions on the prescription label. Take your medicine at regular intervals. Do not take your medicine more often than directed. Do not take more than you are told to take. Talk to your pediatrician regarding the use of this medicine in children. Special care may be needed. Patients over 65 years old may have a stronger reaction and need a smaller dose. Overdosage: If you think you have taken too much of this medicine contact a poison control center or emergency room at once. NOTE: This medicine is only for you. Do not share this medicine with others. What if I miss a dose? If you miss a dose, take it as soon as you can. If it is almost time for your next dose, take only that dose. Do not take double or extra doses. What may interact with this medicine? -alcohol -antihistamines -barbiturates, like phenobarbital -benzodiazepines -cyclobenzaprine -medicines for pain -phenothiazines like chlorpromazine, mesoridazine, prochlorperazine, thioridazine This list may not describe all possible interactions. Give your  health care provider a list of all the medicines, herbs, non-prescription drugs, or dietary supplements you use. Also tell them if you smoke, drink alcohol, or use illegal drugs. Some items may interact with your medicine. What should I watch for while using this medicine? Your mouth may get dry. Chewing sugarless gum or sucking hard candy, and drinking plenty of water may help. Contact your doctor if the problem does not go away or is severe. This medicine may cause dry eyes and blurred vision. If you wear contact lenses you may feel some discomfort. Lubricating drops may help. See your eye doctor if the problem does not go away or is severe. You may get drowsy or dizzy. Do not drive, use machinery, or do anything that needs mental alertness until you know how this medicine affects you. Do not stand or sit up quickly, especially if you are an older patient. This reduces the risk of dizzy or fainting spells. Alcohol may interfere with the effect of this medicine. Avoid alcoholic drinks. What side effects may I notice from receiving this medicine? Side effects that you should report to your doctor or health care professional as soon as possible: -allergic reactions like skin rash, itching or hives, swelling of the face, lips, or tongue -changes in vision -difficulty breathing -fast heartbeat or palpitations -hallucinations -light headedness, fainting spells -vomiting Side effects that usually do not require medical attention (report to your doctor or health care professional if they continue or are bothersome): -dizziness -drowsiness -headache -nausea This list may not describe all possible side effects. Call your doctor for medical advice about side effects. You may report side effects to FDA at 1-800-FDA-1088. Where should I keep my medicine? Keep out of the reach of children. Store at room temperature between 15 and 30 degrees C (59 and 86 degrees F). Protect from light. Keep container  tightly closed. Throw away any unused medicine after the expiration date. NOTE: This sheet is a summary. It may not cover all possible information. If you have questions about this medicine, talk to your doctor, pharmacist, or health care provider.  2015, [employer]vier/Gold Standard. (2008-03-06 17:19:12)

## 2014-08-02 NOTE — ED Notes (Signed)
Pt c/o left flank pain that began today at 3:00pm. Pt sts her urine is darker in color.

## 2014-08-02 NOTE — ED Provider Notes (Signed)
CSN: [id number]     Arrival date & time 08/02/14  0000 History   First MD Initiated Contact with Patient 08/02/14 0045     Chief Complaint  Patient presents with  . Flank Pain     (Consider location/radiation/quality/duration/timing/severity/associated sxs/prior Treatment) Patient is a 28 y.o. female presenting with flank pain. The history is provided by the patient.  Flank Pain  She is at this afternoon sharp left-sided flank pain without radiation. Pain was worse with standing or movement and better with lying. She rated pain at this time it is worse but 3/10 if she is standing still. She does not recall any specific trauma but her work as a more prolonged and twisting. She denies weakness, numbness, tingling. She has some urinary urgency and frequency without any dysuria. She denies fever or chills. She does relate that she is trying to get pregnant. Also, she was recently treated for bacterial vaginosis with metronidazole vaginal gel, and her discharge has returned. She does have a known history of scoliosis.  Past Medical History  Diagnosis Date  . Scoliosis   . Scoliosis    Past Surgical History  Procedure Laterality Date  . Therapeutic abortion     No family history on file. History  Substance Use Topics  . Smoking status: Never Smoker   . Smokeless tobacco: Never Used  . Alcohol Use: Yes   OB History    Gravida Para Term Preterm AB TAB SAB Ectopic Multiple Living   1              Review of Systems  Genitourinary: Positive for flank pain.  All other systems reviewed and are negative.     Allergies  Review of patient's allergies indicates no known allergies.  Home Medications   Prior to Admission medications   Medication Sig Start Date End Date Taking? Authorizing Provider  cephALEXin (KEFLEX) 500 MG capsule Take 1 capsule (500 mg total) by mouth 4 (four) times daily. 07/23/13   Evelina Bucy, MD  ciprofloxacin (CIPRO) 500 MG tablet Take 1 tablet (500 mg  total) by mouth 2 (two) times daily. One po bid x 7 days 03/09/14   Malvin Johns, MD  doxycycline (VIBRAMYCIN) 100 MG capsule Take 1 capsule (100 mg total) by mouth 2 (two) times daily. 08/09/13   Dorie Rank, MD  HYDROcodone-acetaminophen (NORCO) 5-325 MG per tablet Take 1-2 tablets by mouth every 6 (six) hours as needed. 08/01/14   April K Palumbo-Rasch, MD  HYDROcodone-acetaminophen (NORCO/VICODIN) 5-325 MG per tablet Take 1-2 tablets by mouth every 4 (four) hours as needed. 08/09/13   Dorie Rank, MD  HYDROcodone-acetaminophen (NORCO/VICODIN) 5-325 MG per tablet Take 1 tablet by mouth every 4 (four) hours as needed. 10/13/13   Idalia Needle. Sanford, PA-C  ibuprofen (ADVIL,MOTRIN) 600 MG tablet Take 1 tablet (600 mg total) by mouth every 6 (six) hours as needed for pain. 01/14/13   April K Palumbo-Rasch, MD  metroNIDAZOLE (FLAGYL) 500 MG tablet Take 1 tablet (500 mg total) by mouth 2 (two) times daily. 07/23/13   Evelina Bucy, MD  Multiple Vitamins-Calcium (ONE-A-DAY WOMENS PO) Take by mouth.    Historical Provider, MD  naproxen (NAPROSYN) 500 MG tablet Take 1 tablet (500 mg total) by mouth 2 (two) times daily. 08/09/13   Dorie Rank, MD  ondansetron (ZOFRAN ODT) 8 MG disintegrating tablet Take 1 tablet (8 mg total) by mouth every 8 (eight) hours as needed for nausea. 06/05/13   Varney Biles, MD  penicillin v potassium (VEETID)  500 MG tablet Take 1 tablet (500 mg total) by mouth 4 (four) times daily. 08/01/14 08/08/14  April K Palumbo-Rasch, MD  PredniSONE 10 MG KIT Take by mouth.    Historical Provider, MD  Prenatal Vit-Fe Fumarate-FA (PRENATAL MULTIVITAMIN) TABS tablet Take 1 tablet by mouth daily at 12 noon.    Historical Provider, MD  sulfamethoxazole-trimethoprim (SEPTRA DS) 800-160 MG per tablet Take 1 tablet by mouth every 12 (twelve) hours. 10/13/13   Idalia Needle. Sanford, PA-C   BP 133/86 mmHg  Pulse 79  Temp(Src) 98 F (36.7 C) (Oral)  Resp 18  Ht '5\' 7"'  (1.702 m)  Wt 138 lb (62.596 kg)  BMI  21.61 kg/m2  SpO2 100%  LMP 07/18/2014 Physical Exam  Nursing note and vitals reviewed.  28 year old female, resting comfortably and in no acute distress. Vital signs are normal. Oxygen saturation is 100%, which is normal. Head is normocephalic and atraumatic. PERRLA, EOMI. Oropharynx is clear. Neck is nontender and supple without adenopathy or JVD. Back has no midline tenderness. There is mild paralumbar spasm on the left with tenderness in the left paralumbar area extending to the left costovertebral angle. Straight leg raises negative  Lungs are clear without rales, wheezes, or rhonchi. Chest is nontender. Heart has regular rate and rhythm without murmur. Abdomen is soft, flat, nontender without masses or hepatosplenomegaly and peristalsis is normoactive. Extremities have no cyanosis or edema, full range of motion is present. Skin is warm and dry without rash. Neurologic: Mental status is normal, cranial nerves are intact, there are no motor or sensory deficits.  ED Course  Procedures (including critical care time) Labs Review Results for orders placed or performed during the hospital encounter of 08/02/14  Urinalysis, Routine w reflex microscopic  Result Value Ref Range   Color, Urine AMBER (A) YELLOW   APPearance CLEAR CLEAR   Specific Gravity, Urine 1.028 1.005 - 1.030   pH 5.5 5.0 - 8.0   Glucose, UA NEGATIVE NEGATIVE mg/dL   Hgb urine dipstick NEGATIVE NEGATIVE   Bilirubin Urine SMALL (A) NEGATIVE   Ketones, ur 15 (A) NEGATIVE mg/dL   Protein, ur NEGATIVE NEGATIVE mg/dL   Urobilinogen, UA 1.0 0.0 - 1.0 mg/dL   Nitrite NEGATIVE NEGATIVE   Leukocytes, UA NEGATIVE NEGATIVE  Pregnancy, urine  Result Value Ref Range   Preg Test, Ur NEGATIVE NEGATIVE   MDM   Final diagnoses:  Left-sided low back pain without sciatica  Bacterial vaginosis     bone pain which appears to be musculoskeletal. Urinalysis is normal without evidence of UTI and no hematuria to suggest  urolithiasis. Probable recurrence of bacterial vaginosis. I did discuss this with the patient and decision was made to try retreatment with oral metronidazole. This has been effective for her in the past. Back pain is treated with prescriptions for naproxen and orphenadrine. She is to follow-up with her gynecologist in 2 weeks to ensure adequate treatment of bacterial vaginosis.    Delora Fuel, MD 57/47/34 0370

## 2014-10-08 ENCOUNTER — Encounter (HOSPITAL_BASED_OUTPATIENT_CLINIC_OR_DEPARTMENT_OTHER): Payer: Self-pay | Admitting: *Deleted

## 2014-10-08 ENCOUNTER — Emergency Department (HOSPITAL_BASED_OUTPATIENT_CLINIC_OR_DEPARTMENT_OTHER): Payer: Medicaid Other

## 2014-10-08 ENCOUNTER — Emergency Department (HOSPITAL_BASED_OUTPATIENT_CLINIC_OR_DEPARTMENT_OTHER)
Admission: EM | Admit: 2014-10-08 | Discharge: 2014-10-08 | Disposition: A | Discharge status: Home / Self Care | Payer: Medicaid Other | Attending: Emergency Medicine | Admitting: Emergency Medicine

## 2014-10-08 DIAGNOSIS — Z3202 Encounter for pregnancy test, result negative: Secondary | ICD-10-CM | POA: Insufficient documentation

## 2014-10-08 DIAGNOSIS — K219 Gastro-esophageal reflux disease without esophagitis: Secondary | ICD-10-CM | POA: Insufficient documentation

## 2014-10-08 DIAGNOSIS — Z7982 Long term (current) use of aspirin: Secondary | ICD-10-CM | POA: Diagnosis not present

## 2014-10-08 DIAGNOSIS — Z792 Long term (current) use of antibiotics: Secondary | ICD-10-CM | POA: Insufficient documentation

## 2014-10-08 DIAGNOSIS — K029 Dental caries, unspecified: Secondary | ICD-10-CM | POA: Diagnosis not present

## 2014-10-08 DIAGNOSIS — K0889 Other specified disorders of teeth and supporting structures: Secondary | ICD-10-CM

## 2014-10-08 DIAGNOSIS — K088 Other specified disorders of teeth and supporting structures: Secondary | ICD-10-CM | POA: Insufficient documentation

## 2014-10-08 DIAGNOSIS — Z79899 Other long term (current) drug therapy: Secondary | ICD-10-CM | POA: Diagnosis not present

## 2014-10-08 DIAGNOSIS — R079 Chest pain, unspecified: Secondary | ICD-10-CM | POA: Insufficient documentation

## 2014-10-08 LAB — CBC WITH DIFFERENTIAL/PLATELET
BASOS ABS: 0 10*3/uL (ref 0.0–0.1)
BASOS PCT: 0 % (ref 0–1)
EOS ABS: 0.4 10*3/uL (ref 0.0–0.7)
Eosinophils Relative: 4 % (ref 0–5)
HCT: 39.4 % (ref 36.0–46.0)
Hemoglobin: 13.2 g/dL (ref 12.0–15.0)
Lymphocytes Relative: 27 % (ref 12–46)
Lymphs Abs: 2.5 10*3/uL (ref 0.7–4.0)
MCH: 29.3 pg (ref 26.0–34.0)
MCHC: 33.5 g/dL (ref 30.0–36.0)
MCV: 87.6 fL (ref 78.0–100.0)
MONOS PCT: 7 % (ref 3–12)
Monocytes Absolute: 0.7 10*3/uL (ref 0.1–1.0)
NEUTROS ABS: 5.6 10*3/uL (ref 1.7–7.7)
NEUTROS PCT: 62 % (ref 43–77)
PLATELETS: 225 10*3/uL (ref 150–400)
RBC: 4.5 MIL/uL (ref 3.87–5.11)
RDW: 13.4 % (ref 11.5–15.5)
WBC: 9.2 10*3/uL (ref 4.0–10.5)

## 2014-10-08 LAB — URINALYSIS, ROUTINE W REFLEX MICROSCOPIC
Bilirubin Urine: NEGATIVE
Glucose, UA: NEGATIVE mg/dL
Hgb urine dipstick: NEGATIVE
KETONES UR: 15 mg/dL — AB
LEUKOCYTES UA: NEGATIVE
NITRITE: NEGATIVE
Protein, ur: NEGATIVE mg/dL
Specific Gravity, Urine: 1.019 (ref 1.005–1.030)
Urobilinogen, UA: 1 mg/dL (ref 0.0–1.0)
pH: 8 (ref 5.0–8.0)

## 2014-10-08 LAB — PREGNANCY, URINE: Preg Test, Ur: NEGATIVE

## 2014-10-08 LAB — BASIC METABOLIC PANEL
Anion gap: 8 (ref 5–15)
BUN: 18 mg/dL (ref 6–23)
CALCIUM: 9.1 mg/dL (ref 8.4–10.5)
CHLORIDE: 106 mmol/L (ref 96–112)
CO2: 25 mmol/L (ref 19–32)
Creatinine, Ser: 0.77 mg/dL (ref 0.50–1.10)
GFR calc Af Amer: >90 mL/min (ref >90)
GLUCOSE: 124 mg/dL — AB (ref 70–99)
Potassium: 3.5 mmol/L (ref 3.5–5.1)
Sodium: 139 mmol/L (ref 135–145)

## 2014-10-08 LAB — TROPONIN I: Troponin I: <0.03 ng/mL (ref <0.031)

## 2014-10-08 MED ORDER — PENICILLIN V POTASSIUM 500 MG PO TABS: 500.0000 mg | ORAL_TABLET | Freq: Three times a day (TID) | ORAL | Status: DC

## 2014-10-08 MED ORDER — SUCRALFATE 1 GM/10ML PO SUSP: 1.0000 g | Freq: Three times a day (TID) | ORAL | Status: DC

## 2014-10-08 MED ORDER — OMEPRAZOLE 20 MG PO CPDR: 20.0000 mg | DELAYED_RELEASE_CAPSULE | Freq: Every day | ORAL | Status: DC

## 2014-10-08 MED ORDER — CYCLOBENZAPRINE HCL 10 MG PO TABS: 10.0000 mg | ORAL_TABLET | Freq: Two times a day (BID) | ORAL | Status: DC | PRN

## 2014-10-08 MED ORDER — GI COCKTAIL ~~LOC~~
30.0000 mL | Freq: Once | ORAL | Status: AC
  Administered 2014-10-08: 30 mL via ORAL
  Filled 2014-10-08: qty 30

## 2014-10-08 MED ORDER — SODIUM CHLORIDE 0.9 % IV BOLUS (SEPSIS)
1000.0000 mL | Freq: Once | INTRAVENOUS | Status: AC
  Administered 2014-10-08: 1000 mL via INTRAVENOUS

## 2014-10-08 NOTE — ED Provider Notes (Signed)
CSN: [id number]     Arrival date & time 10/08/14  1837 History   First MD Initiated Contact with Patient 10/08/14 1846     Chief Complaint  Patient presents with  . Chest Pain     (Consider location/radiation/quality/duration/timing/severity/associated sxs/prior Treatment) HPI  PCP: Glory Buff, MD Blood pressure 135/75, pulse 79, temperature 98.4 F (36.9 C), temperature source Oral, resp. rate 18, height _0  (1.702 m), weight 138 lb (62.596 kg), last menstrual period 10/01/2014, SpO2 100 %.  Theresa Winters is a 29 y.o.female without any significant PMH presents to the ER with complaints of chest pain for two days. The pain has been constant. She describes the pain as in her left chest, upper back and radiating down her left arm. Denies it being associated with SOB, nausea, vomiting, diarrhea or diaphoresis. She has been dealing with a dental abscess which has been recurrent. Currently, no abscess or pain. Has not been on antibiotics. She reports having chest pains when she was younger, watched by a cardiologist at 29 yo but is unsure of why or the diagnosis. She also has a positive hx of acid reflux but feels the symptoms have been improved. Denies lower extremity swelling or orthopnea.  :   Past Medical History  Diagnosis Date  . Scoliosis   . Scoliosis    Past Surgical History  Procedure Laterality Date  . Therapeutic abortion     No family history on file. History  Substance Use Topics  . Smoking status: Never Smoker   . Smokeless tobacco: Never Used  . Alcohol Use: Yes   OB History    Gravida Para Term Preterm AB TAB SAB Ectopic Multiple Living   1              Review of Systems  10 Systems reviewed and are negative for acute change except as noted in the HPI.     Allergies  Review of patient's allergies indicates no known allergies.  Home Medications   Prior to Admission medications   Medication Sig Start Date End Date Taking? Authorizing Provider   cephALEXin (KEFLEX) 500 MG capsule Take 1 capsule (500 mg total) by mouth 4 (four) times daily. 07/23/13   Evelina Bucy, MD  ciprofloxacin (CIPRO) 500 MG tablet Take 1 tablet (500 mg total) by mouth 2 (two) times daily. One po bid x 7 days 03/09/14   Malvin Johns, MD  cyclobenzaprine (FLEXERIL) 10 MG tablet Take 1 tablet (10 mg total) by mouth 2 (two) times daily as needed for muscle spasms. 10/08/14   Nyia Tsao Marilu Favre, PA-C  doxycycline (VIBRAMYCIN) 100 MG capsule Take 1 capsule (100 mg total) by mouth 2 (two) times daily. 08/09/13   Dorie Rank, MD  HYDROcodone-acetaminophen (NORCO) 5-325 MG per tablet Take 1-2 tablets by mouth every 6 (six) hours as needed. 08/01/14   April K Palumbo-Rasch, MD  HYDROcodone-acetaminophen (NORCO/VICODIN) 5-325 MG per tablet Take 1-2 tablets by mouth every 4 (four) hours as needed. 08/09/13   Dorie Rank, MD  HYDROcodone-acetaminophen (NORCO/VICODIN) 5-325 MG per tablet Take 1 tablet by mouth every 4 (four) hours as needed. 10/13/13   Idalia Needle. Sanford, PA-C  ibuprofen (ADVIL,MOTRIN) 600 MG tablet Take 1 tablet (600 mg total) by mouth every 6 (six) hours as needed for pain. 01/14/13   April K Palumbo-Rasch, MD  metroNIDAZOLE (FLAGYL) 500 MG tablet Take 1 tablet (500 mg total) by mouth 2 (two) times daily. 07/23/13   Evelina Bucy, MD  metroNIDAZOLE (FLAGYL) 500 MG tablet  Take 1 tablet (500 mg total) by mouth 2 (two) times daily. 81/27/51   Delora Fuel, MD  Multiple Vitamins-Calcium (ONE-A-DAY WOMENS PO) Take by mouth.    Historical Provider, MD  naproxen (NAPROSYN) 500 MG tablet Take 1 tablet (500 mg total) by mouth 2 (two) times daily. 70/01/74   Delora Fuel, MD  omeprazole (PRILOSEC) 20 MG capsule Take 1 capsule (20 mg total) by mouth daily. 10/08/14   Jagger Beahm Marilu Favre, PA-C  ondansetron (ZOFRAN ODT) 8 MG disintegrating tablet Take 1 tablet (8 mg total) by mouth every 8 (eight) hours as needed for nausea. 06/05/13   Varney Biles, MD  orphenadrine (NORFLEX) 100 MG tablet  Take 1 tablet (100 mg total) by mouth 2 (two) times daily. 94/49/67   Delora Fuel, MD  penicillin v potassium (VEETID) 500 MG tablet Take 1 tablet (500 mg total) by mouth 3 (three) times daily. 10/08/14   Demetria Lightsey Marilu Favre, PA-C  PredniSONE 10 MG KIT Take by mouth.    Historical Provider, MD  Prenatal Vit-Fe Fumarate-FA (PRENATAL MULTIVITAMIN) TABS tablet Take 1 tablet by mouth daily at 12 noon.    Historical Provider, MD  sucralfate (CARAFATE) 1 GM/10ML suspension Take 10 mLs (1 g total) by mouth 4 (four) times daily -  with meals and at bedtime. 10/08/14   Gracee Ratterree Marilu Favre, PA-C  sulfamethoxazole-trimethoprim (SEPTRA DS) 800-160 MG per tablet Take 1 tablet by mouth every 12 (twelve) hours. 10/13/13   Idalia Needle. Sanford, PA-C   BP 135/75 mmHg  Pulse 79  Temp(Src) 98.4 F (36.9 C) (Oral)  Resp 18  Ht _0  (1.702 m)  Wt 138 lb (62.596 kg)  BMI 21.61 kg/m2  SpO2 100%  LMP 10/01/2014 Physical Exam  Constitutional: She appears well-developed and well-nourished. No distress.  HENT:  Head: Normocephalic and atraumatic.  Mouth/Throat: Uvula is midline, oropharynx is clear and moist and mucous membranes are normal. Dental caries present.  Eyes: Pupils are equal, round, and reactive to light.  Neck: Normal range of motion. Neck supple.  Cardiovascular: Normal rate and regular rhythm.   Pulmonary/Chest: Effort normal and breath sounds normal. She has no decreased breath sounds. She has no wheezes. She has no rhonchi. She exhibits no tenderness, no bony tenderness and no retraction.  No pain to chest wall with palpation or ROM.  Abdominal: Soft. Bowel sounds are normal. She exhibits no distension. There is no tenderness. There is no rigidity, no rebound and no guarding.  Neurological: She is alert.  Skin: Skin is warm and dry.  Nursing note and vitals reviewed.   ED Course  Procedures (including critical care time) Labs Review Labs Reviewed  URINALYSIS, ROUTINE W REFLEX MICROSCOPIC - Abnormal;  Notable for the following:    Ketones, ur 15 (*)    All other components within normal limits  BASIC METABOLIC PANEL - Abnormal; Notable for the following:    Glucose, Bld 124 (*)    All other components within normal limits  PREGNANCY, URINE  CBC WITH DIFFERENTIAL/PLATELET  TROPONIN I    Imaging Review Dg Chest 2 View  10/08/2014   CLINICAL DATA:  Chest pain for 2 days, left arm pain  EXAM: CHEST  2 VIEW  COMPARISON:  06/19/2012  FINDINGS: Cardiomediastinal silhouette is stable. No acute infiltrate or pleural effusion. No pulmonary edema. Again noted S-shaped thoracolumbar scoliosis. There is dextroscoliosis of the mid to lower thoracic spine and levoscoliosis of the upper lumbar spine.  IMPRESSION: No active cardiopulmonary disease. Again noted S-shaped thoracolumbar  scoliosis.   Electronically Signed   By: Lahoma Crocker M.D.   On: 10/08/2014 19:29     EKG Interpretation   Date/Time:  Monday October 08 2014 18:42:01 EST Ventricular Rate:  87 PR Interval:  138 QRS Duration: 106 QT Interval:  342 QTC Calculation: 411 R Axis:   73 Text Interpretation:  Normal sinus rhythm with sinus arrhythmia Incomplete  right bundle branch block Borderline ECG Confirmed by DELOS  MD, DOUGLAS  ([id number]) on 10/08/2014 6:50:08 PM      MDM   Final diagnoses:  Chest pain  Gastroesophageal reflux disease, esophagitis presence not specified  Toothache    Medications  sodium chloride 0.9 % bolus 1,000 mL (1,000 mLs Intravenous New Bag/Given 10/08/14 1928)  gi cocktail (Maalox,Lidocaine,Donnatal) (30 mLs Oral Given 10/08/14 1918)   Pt reports complete resolution of pain with GI cocktail. Feels much better. She reports taking a significant amount of Ibuprofen recent;y because of her toothache. Will give penicillin, Carafate and Prilosec. Patient is young and otherwise healthy, ACS is very unlikely for this presentation. Negative Troponin neg urine preg. Doubt PE as she is not experiencing any tachycardia  or SOB- also pain relieved with GI cocktail.   You have been diagnosed with Dental pain. Please call the follow up dentist first thing in the morning on Monday for a follow up appointment. Keep your discharge paperwork from today's visit to bring to the dentist office. You may also use the resource guide listed below to help you find a dentist if you do not already have one to followup with. It is very important that you get evaluated by a dentist as soon as possible. Use your pain medication as prescribed and do not operate heavy machinery while on pain medication. Note that your pain medication contains acetaminophen (Tylenol) & its is not reccommended that you use additional acetaminophen (Tylenol) while taking this medication. Take your full course of antibiotics. Read the instructions below.     29 y.o.Theresa Winters's evaluation in the Emergency Department is complete. It has been determined that no acute conditions requiring further emergency intervention are present at this time. The patient/guardian have been advised of the diagnosis and plan. We have discussed signs and symptoms that warrant return to the ED, such as changes or worsening in symptoms.  Vital signs are stable at discharge. Filed Vitals:   10/08/14 1842  BP: 135/75  Pulse: 79  Temp: 98.4 F (36.9 C)  Resp: 18    Patient/guardian has voiced understanding and agreed to follow-up with the PCP or specialist.    Linus Mako, PA-C 10/08/14 2015  Veryl Speak, MD 10/08/14 2200

## 2014-10-08 NOTE — Discharge Instructions (Signed)
Food Choices for Gastroesophageal Reflux Disease When you have gastroesophageal reflux disease (GERD), the foods you eat and your eating habits are very important. Choosing the right foods can help ease the discomfort of GERD. WHAT GENERAL GUIDELINES DO I NEED TO FOLLOW?  Choose fruits, vegetables, whole grains, low-fat dairy products, and low-fat meat, fish, and poultry.  Limit fats such as oils, salad dressings, butter, nuts, and avocado.  Keep a food diary to identify foods that cause symptoms.  Avoid foods that cause reflux. These may be different for different people.  Eat frequent small meals instead of three large meals each day.  Eat your meals slowly, in a relaxed setting.  Limit fried foods.  Cook foods using methods other than frying.  Avoid drinking alcohol.  Avoid drinking large amounts of liquids with your meals.  Avoid bending over or lying down until 2-3 hours after eating. WHAT FOODS ARE NOT RECOMMENDED? The following are some foods and drinks that may worsen your symptoms: Vegetables Tomatoes. Tomato juice. Tomato and spaghetti sauce. Chili peppers. Onion and garlic. Horseradish. Fruits Oranges, grapefruit, and lemon (fruit and juice). Meats High-fat meats, fish, and poultry. This includes hot dogs, ribs, ham, sausage, salami, and bacon. Dairy Whole milk and chocolate milk. Sour cream. Cream. Butter. Ice cream. Cream cheese.  Beverages Coffee and tea, with or without caffeine. Carbonated beverages or energy drinks. Condiments Hot sauce. Barbecue sauce.  Sweets/Desserts Chocolate and cocoa. Donuts. Peppermint and spearmint. Fats and Oils High-fat foods, including French fries and potato chips. Other Vinegar. Strong spices, such as black pepper, white pepper, red pepper, cayenne, curry powder, cloves, ginger, and chili powder. The items listed above may not be a complete list of foods and beverages to avoid. Contact your dietitian for more  information. Document Released: 08/10/2005 Document Revised: 08/15/2013 Document Reviewed: 06/14/2013 Exit[employer] Patient Information 2015 [employer]. This information is not intended to replace advice given to you by your health care provider. Make sure you discuss any questions you have with your health care provider. Gastroesophageal Reflux Disease, Adult Gastroesophageal reflux disease (GERD) happens when acid from your stomach flows up into the esophagus. When acid comes in contact with the esophagus, the acid causes soreness (inflammation) in the esophagus. Over time, GERD may create small holes (ulcers) in the lining of the esophagus. CAUSES   Increased body weight. This puts pressure on the stomach, making acid rise from the stomach into the esophagus.  Smoking. This increases acid production in the stomach.  Drinking alcohol. This causes decreased pressure in the lower esophageal sphincter (valve or ring of muscle between the esophagus and stomach), allowing acid from the stomach into the esophagus.  Late evening meals and a full stomach. This increases pressure and acid production in the stomach.  A malformed lower esophageal sphincter. Sometimes, no cause is found. SYMPTOMS   Burning pain in the lower part of the mid-chest behind the breastbone and in the mid-stomach area. This may occur twice a week or more often.  Trouble swallowing.  Sore throat.  Dry cough.  Asthma-like symptoms including chest tightness, shortness of breath, or wheezing. DIAGNOSIS  Your caregiver may be able to diagnose GERD based on your symptoms. In some cases, X-rays and other tests may be done to check for complications or to check the condition of your stomach and esophagus. TREATMENT  Your caregiver may recommend over-the-counter or prescription medicines to help decrease acid production. Ask your caregiver before starting or adding any new medicines.  HOME CARE   INSTRUCTIONS   Change the  factors that you can control. Ask your caregiver for guidance concerning weight loss, quitting smoking, and alcohol consumption.  Avoid foods and drinks that make your symptoms worse, such as:  Caffeine or alcoholic drinks.  Chocolate.  Peppermint or mint flavorings.  Garlic and onions.  Spicy foods.  Citrus fruits, such as oranges, lemons, or limes.  Tomato-based foods such as sauce, chili, salsa, and pizza.  Fried and fatty foods.  Avoid lying down for the 3 hours prior to your bedtime or prior to taking a nap.  Eat small, frequent meals instead of large meals.  Wear loose-fitting clothing. Do not wear anything tight around your waist that causes pressure on your stomach.  Raise the head of your bed 6 to 8 inches with wood blocks to help you sleep. Extra pillows will not help.  Only take over-the-counter or prescription medicines for pain, discomfort, or fever as directed by your caregiver.  Do not take aspirin, ibuprofen, or other nonsteroidal anti-inflammatory drugs (NSAIDs). SEEK IMMEDIATE MEDICAL CARE IF:   You have pain in your arms, neck, jaw, teeth, or back.  Your pain increases or changes in intensity or duration.  You develop nausea, vomiting, or sweating (diaphoresis).  You develop shortness of breath, or you faint.  Your vomit is green, yellow, black, or looks like coffee grounds or blood.  Your stool is red, bloody, or black. These symptoms could be signs of other problems, such as heart disease, gastric bleeding, or esophageal bleeding. MAKE SURE YOU:   Understand these instructions.  Will watch your condition.  Will get help right away if you are not doing well or get worse. Document Released: 05/20/2005 Document Revised: 11/02/2011 Document Reviewed: 02/27/2011 Exit[employer] Patient Information 2015 [employer]. This information is not intended to replace advice given to you by your health care provider. Make sure you discuss any questions you have  with your health care provider.  

## 2014-10-08 NOTE — ED Notes (Signed)
Patient transported to X-ray 

## 2014-10-08 NOTE — ED Notes (Signed)
Chest pain x 2 days. Sharp pain in her left chest with radiation into her left arm and pain in her lower back.

## 2016-02-13 ENCOUNTER — Emergency Department (HOSPITAL_BASED_OUTPATIENT_CLINIC_OR_DEPARTMENT_OTHER)
Admission: EM | Admit: 2016-02-13 | Discharge: 2016-02-13 | Disposition: A | Discharge status: Home / Self Care | Payer: Medicaid Other | Attending: Emergency Medicine | Admitting: Emergency Medicine

## 2016-02-13 ENCOUNTER — Encounter (HOSPITAL_BASED_OUTPATIENT_CLINIC_OR_DEPARTMENT_OTHER): Payer: Self-pay

## 2016-02-13 DIAGNOSIS — B9689 Other specified bacterial agents as the cause of diseases classified elsewhere: Secondary | ICD-10-CM

## 2016-02-13 DIAGNOSIS — N760 Acute vaginitis: Secondary | ICD-10-CM | POA: Insufficient documentation

## 2016-02-13 LAB — URINALYSIS, ROUTINE W REFLEX MICROSCOPIC
Bilirubin Urine: NEGATIVE
Glucose, UA: NEGATIVE mg/dL
Hgb urine dipstick: NEGATIVE
Ketones, ur: 15 mg/dL — AB
Leukocytes, UA: NEGATIVE
Nitrite: NEGATIVE
Protein, ur: NEGATIVE mg/dL
Specific Gravity, Urine: 1.029 (ref 1.005–1.030)
pH: 7 (ref 5.0–8.0)

## 2016-02-13 LAB — WET PREP, GENITAL
Sperm: NONE SEEN
Trich, Wet Prep: NONE SEEN
Yeast Wet Prep HPF POC: NONE SEEN

## 2016-02-13 LAB — PREGNANCY, URINE: Preg Test, Ur: NEGATIVE

## 2016-02-13 MED ORDER — METRONIDAZOLE 500 MG PO TABS: 500.0000 mg | ORAL_TABLET | Freq: Two times a day (BID) | ORAL | Status: DC

## 2016-02-13 MED FILL — metroNIDAZOLE 500 MG TABS: 500 | 7 days supply | Qty: 14 | Fill #0

## 2016-02-13 NOTE — ED Notes (Signed)
Pelvic set up complete.

## 2016-02-13 NOTE — ED Notes (Signed)
C/o vaginal d/c and left flank pain-NAD-steady gait

## 2016-02-13 NOTE — ED Notes (Signed)
PA at bedside.

## 2016-02-13 NOTE — ED Notes (Signed)
Pt directed to pharmacy to pick up prescriptions-  

## 2016-02-13 NOTE — ED Provider Notes (Signed)
CSN: [id number]     Arrival date & time 02/13/16  1057 History   First MD Initiated Contact with Patient 02/13/16 1112     Chief Complaint  Patient presents with  . Vaginal Discharge    HPI   [age] year old female presents today with complaints of vaginal discharge. Patient reports 2 day history of white copious vaginal discharge, no odor. She reports this is similar to previous episodes of yeast infection. Patient denies any recent antibiotics, reports she is sexually active with her husband. Eyes any dysuria, abdominal pain, fever, chills, nausea, vomiting. Patient reports left lower back pain, typical of her scoliosis, very minimal tenderness to palpation of the left lateral soft tissues. She denies any red flags.   Past Medical History  Diagnosis Date  . Scoliosis   . Scoliosis    Past Surgical History  Procedure Laterality Date  . Therapeutic abortion     No family history on file. Social History  Substance Use Topics  . Smoking status: Never Smoker   . Smokeless tobacco: Never Used  . Alcohol Use: Yes     Comment: occ   OB History    Gravida Para Term Preterm AB TAB SAB Ectopic Multiple Living   1              Review of Systems  All other systems reviewed and are negative.     Allergies  Review of patient's allergies indicates no known allergies.  Home Medications   Prior to Admission medications   Medication Sig Start Date End Date Taking? Authorizing Provider  metroNIDAZOLE (FLAGYL) 500 MG tablet Take 1 tablet (500 mg total) by mouth 2 (two) times daily. 02/13/16   Bryse Blanchette, PA-C   BP 134/89 mmHg  Pulse 70  Temp(Src) 98.1 F (36.7 C) (Oral)  Resp 16  Ht 5\' 7"  (1.702 m)  Wt 63.957 kg  BMI 22.08 kg/m2  SpO2 100%   Physical Exam  Constitutional: She is oriented to person, place, and time. She appears well-developed and well-nourished.  HENT:  Head: Normocephalic and atraumatic.  Eyes: Conjunctivae are normal. Pupils are equal, round, and reactive  to light. Right eye exhibits no discharge. Left eye exhibits no discharge. No scleral icterus.  Neck: Normal range of motion. No JVD present. No tracheal deviation present.  Pulmonary/Chest: Effort normal. No stridor.  Genitourinary: There is no rash, tenderness, lesion or injury on the right labia. There is no rash, tenderness, lesion or injury on the left labia. Cervix exhibits no motion tenderness, no discharge and no friability. Right adnexum displays no mass, no tenderness and no fullness. Left adnexum displays no mass, no tenderness and no fullness. Vaginal discharge found.  White nonpurulent vaginal discharge, no cervical motion tenderness  Neurological: She is alert and oriented to person, place, and time. Coordination normal.  Psychiatric: She has a normal mood and affect. Her behavior is normal. Judgment and thought content normal.  Nursing note and vitals reviewed.   ED Course  Procedures (including critical care time) Labs Review Labs Reviewed  WET PREP, GENITAL - Abnormal; Notable for the following:    Clue Cells Wet Prep HPF POC PRESENT (*)    WBC, Wet Prep HPF POC MODERATE (*)    All other components within normal limits  URINALYSIS, ROUTINE W REFLEX MICROSCOPIC (NOT AT [facility]) - Abnormal; Notable for the following:    Color, Urine AMBER (*)    APPearance CLOUDY (*)    Ketones, ur 15 (*)    All  other components within normal limits  PREGNANCY, URINE  GC/CHLAMYDIA PROBE AMP ([place]) NOT AT [facility]    Imaging Review No results found. I have personally reviewed and evaluated these images and lab results as part of my medical decision-making.   EKG Interpretation None      MDM   Final diagnoses:  BV (bacterial vaginosis)    Labs:Wet prep, urine pregnancy, urinalysis  Imaging:  Consults:  Therapeutics:  Discharge Meds:  Metronidazole  Assessment/Plan: Patient's presentation most consistent with bacterial vaginosis. Afebrile nontoxic, nontender abdomen  with no cervical motion tenderness. Treated with metronidazole, primary care follow-up. Strict return precautions given        Eyvonne MechanicJeffrey Verlin Duke, PA-C 02/13/16 1250  Gwyneth SproutWhitney Plunkett, MD 02/13/16 [id number]

## 2016-02-14 LAB — GC/CHLAMYDIA PROBE AMP (~~LOC~~) NOT AT ARMC
Chlamydia: POSITIVE — AB
Neisseria Gonorrhea: NEGATIVE

## 2016-02-17 ENCOUNTER — Telehealth (HOSPITAL_BASED_OUTPATIENT_CLINIC_OR_DEPARTMENT_OTHER): Payer: Self-pay | Admitting: Emergency Medicine

## 2016-02-17 NOTE — Telephone Encounter (Signed)
Chart handoff to EDP for + chlamydia 

## 2016-02-18 ENCOUNTER — Telehealth (HOSPITAL_BASED_OUTPATIENT_CLINIC_OR_DEPARTMENT_OTHER): Payer: Self-pay | Admitting: Emergency Medicine

## 2016-02-18 NOTE — Telephone Encounter (Signed)
Post ED Visit - Positive Culture Follow-up: Successful Patient Follow-Up  Culture assessed and recommendations reviewed by: []  Enzo BiNathan Batchelder, Pharm.D. []  Celedonio MiyamotoJeremy Frens, Pharm.D., BCPS []  Garvin FilaMike Maccia, Pharm.D. []  Georgina PillionElizabeth Martin, Pharm.D., BCPS []  SweetwaterMinh Pham, [address] BoulevardPharm.D., BCPS, AAHIVP []  Estella HuskMichelle Turner, Pharm.D., BCPS, AAHIVP []  Tennis Mustassie Stewart, Pharm.D. []  Sherle Poeob Vincent, [address]Pharm[address]  Positive chlamdyia culture  [x]  Patient discharged without antimicrobial prescription and treatment is now indicated []  Organism is resistant to prescribed ED discharge antimicrobial []  Patient with positive blood cultures  Changes discussed with ED provider: Doug SouSam Jacubowitz MD New antibiotic prescription Zithromax 1 gram po x 1  [facility] [address]  Contacted patient, 02/18/16 1605   Berle MullMiller, Brittinee Risk 02/18/2016, 4:04 PM

## 2016-02-19 ENCOUNTER — Encounter (HOSPITAL_BASED_OUTPATIENT_CLINIC_OR_DEPARTMENT_OTHER): Payer: Self-pay | Admitting: *Deleted

## 2016-02-19 ENCOUNTER — Emergency Department (HOSPITAL_BASED_OUTPATIENT_CLINIC_OR_DEPARTMENT_OTHER)
Admission: EM | Admit: 2016-02-19 | Discharge: 2016-02-19 | Disposition: A | Discharge status: Home / Self Care | Payer: Medicaid Other | Attending: Emergency Medicine | Admitting: Emergency Medicine

## 2016-02-19 DIAGNOSIS — Z792 Long term (current) use of antibiotics: Secondary | ICD-10-CM | POA: Insufficient documentation

## 2016-02-19 DIAGNOSIS — Z113 Encounter for screening for infections with a predominantly sexual mode of transmission: Secondary | ICD-10-CM | POA: Insufficient documentation

## 2016-02-19 NOTE — ED Provider Notes (Signed)
CSN: [id number]     Arrival date & time 02/19/16  1712 History   First MD Initiated Contact with Patient 02/19/16 1844     Chief Complaint  Patient presents with  . SEXUALLY TRANSMITTED DISEASE    Theresa Winters is a 30 y.o. female Who presents to the emergency department requesting STD check. The patient was seen 1 week ago for some vaginal discharge and believes she had a yeast infection. Patient was diagnosed with bacterial vaginosis and discharged with Flagyl. Later her gonorrhea and chlamydia testing returned and this was positive for chlamydia. Patient reports yesterday she was called and told that Chlamydia was positive and she was given a prescription for azithromycin. She took the azithromycin yesterday. She reports since taking the Flagyl and azithromycin she is feeling better and her vaginal discharge is resolving. She reports she came to the emergency department to have HIV testing as well. She reports she did not have HIV testing last time and she is not able to follow up with her OB/GYN until next week. She wanted to have STD testing done today. Patient denies any current physical complaints. She denies fevers, body aches, sore throat, mouth sores, cough, abdominal pain, nausea, vomiting, vaginal bleeding, GU lesions, rashes or sores.  The history is provided by the patient and medical records. No language interpreter was used.    Past Medical History  Diagnosis Date  . Scoliosis   . Scoliosis    Past Surgical History  Procedure Laterality Date  . Therapeutic abortion     History reviewed. No pertinent family history. Social History  Substance Use Topics  . Smoking status: Never Smoker   . Smokeless tobacco: Never Used  . Alcohol Use: Yes     Comment: occ   OB History    Gravida Para Term Preterm AB TAB SAB Ectopic Multiple Living   1              Review of Systems  Constitutional: Negative for fever and chills.  HENT: Negative for mouth sores.   Eyes: Negative  for visual disturbance.  Respiratory: Negative for cough and shortness of breath.   Cardiovascular: Negative for chest pain.  Gastrointestinal: Negative for nausea, vomiting, abdominal pain and diarrhea.  Genitourinary: Negative for dysuria, urgency, frequency, hematuria, flank pain, decreased urine volume, vaginal bleeding, difficulty urinating, menstrual problem and pelvic pain. Vaginal discharge: resolving   Musculoskeletal: Negative for back pain.  Skin: Negative for rash and wound.  Neurological: Negative for headaches.      Allergies  Review of patient's allergies indicates no known allergies.  Home Medications   Prior to Admission medications   Medication Sig Start Date End Date Taking? Authorizing Provider  azithromycin (ZITHROMAX) 1 g powder Take 1 g by mouth once.   Yes Historical Provider, MD  metroNIDAZOLE (FLAGYL) 500 MG tablet Take 1 tablet (500 mg total) by mouth 2 (two) times daily. 02/13/16   Eyvonne MechanicJeffrey Hedges, PA-C   BP 114/85 mmHg  Pulse 64  Temp(Src) 98.6 F (37 C) (Oral)  Resp 18  SpO2 100%  LMP 01/31/2016 Physical Exam  Constitutional: She appears well-developed and well-nourished. No distress.  Nontoxic appearing.  HENT:  Head: Normocephalic and atraumatic.  Mouth/Throat: Oropharynx is clear and moist.  Eyes: Pupils are equal, round, and reactive to light. Right eye exhibits no discharge. Left eye exhibits no discharge.  Neck: Neck supple.  Cardiovascular: Normal rate, regular rhythm, normal heart sounds and intact distal pulses.   Pulmonary/Chest: Effort normal and  breath sounds normal. No respiratory distress.  Abdominal: Soft. She exhibits no distension. There is no tenderness. There is no guarding.  Abdomen is soft and nontender to palpation.  Lymphadenopathy:    She has no cervical adenopathy.  Neurological: She is alert. Coordination normal.  Skin: Skin is warm and dry. No rash noted. She is not diaphoretic. No erythema. No pallor.  Psychiatric:  She has a normal mood and affect. Her behavior is normal.  Nursing note and vitals reviewed.   ED Course  Procedures (including critical care time) Labs Review Labs Reviewed  RPR  HIV ANTIBODY (ROUTINE TESTING)    Imaging Review No results found.   EKG Interpretation None      Filed Vitals:   02/19/16 1721 02/19/16 1909  BP: 126/89 114/85  Pulse: 88 64  Temp: 98.6 F (37 C)   TempSrc: Oral   Resp: 16 18  SpO2: 100% 100%     MDM   Meds given in ED:  Medications - No data to display  New Prescriptions   No medications on file    Final diagnoses:  Screen for STD (sexually transmitted disease)    This is a 30 y.o. female Who presents to the emergency department requesting STD check. The patient was seen 1 week ago for some vaginal discharge and believes she had a yeast infection. Patient was diagnosed with bacterial vaginosis and discharged with Flagyl. Later her gonorrhea and chlamydia testing returned and this was positive for chlamydia. Patient reports yesterday she was called and told that Chlamydia was positive and she was given a prescription for azithromycin. She took the azithromycin yesterday. She reports since taking the Flagyl and azithromycin she is feeling better and her vaginal discharge is resolving. She reports she came to the emergency department to have HIV testing as well. She reports she did not have HIV testing last time and she is not able to follow up with her OB/GYN until next week. She wanted to have STD testing done today.  On exam the patient is afebrile nontoxic appearing. Her abdomen is soft and nontender to palpation.  I discussed with the patient that generally we do not do routine STD testing in the emergency department. This is to be reserved for the health department and/or her OB/GYN. I advised that I would check her HIV and syphilis tests today. I see no need to check her gonorrhea and chlamydia test as the patient tested positive for this  and received treatment for it yesterday. She also reports resolving vaginal discharge and has no current complaints. Patient has follow-up with her OB/GYN next week. I encouraged her to keep this appointment and have routine STD testing done either with her OB/GYN or at the health Department. I advised that he would take 3-4 days to have these test results returned and that she should call and follow up on the test results in about 3 days. I advised the patient to follow-up with their primary care provider this week. I advised the patient to return to the emergency department with new or worsening symptoms or new concerns. The patient verbalized understanding and agreement with plan.     Everlene FarrierWilliam Dayton Kenley, PA-C 02/19/16 2004  Arby BarretteMarcy Pfeiffer, MD 02/22/16 1130

## 2016-02-19 NOTE — Discharge Instructions (Signed)
Sexually Transmitted Disease °A sexually transmitted disease (STD) is a disease or infection that may be passed (transmitted) from person to person, usually during sexual activity. This may happen by way of saliva, semen, blood, vaginal mucus, or urine. Common STDs include: °· Gonorrhea. °· Chlamydia. °· Syphilis. °· HIV and AIDS. °· Genital herpes. °· Hepatitis B and C. °· Trichomonas. °· Human papillomavirus (HPV). °· Pubic lice. °· Scabies. °· Mites. °· Bacterial vaginosis. °WHAT ARE CAUSES OF STDs? °An STD may be caused by bacteria, a virus, or parasites. STDs are often transmitted during sexual activity if one person is infected. However, they may also be transmitted through nonsexual means. STDs may be transmitted after:  °· Sexual intercourse with an infected person. °· Sharing sex toys with an infected person. °· Sharing needles with an infected person or using unclean piercing or tattoo needles. °· Having intimate contact with the genitals, mouth, or rectal areas of an infected person. °· Exposure to infected fluids during birth. °WHAT ARE THE SIGNS AND SYMPTOMS OF STDs? °Different STDs have different symptoms. Some people may not have any symptoms. If symptoms are present, they may include: °· Painful or bloody urination. °· Pain in the pelvis, abdomen, vagina, anus, throat, or eyes. °· A skin rash, itching, or irritation. °· Growths, ulcerations, blisters, or sores in the genital and anal areas. °· Abnormal vaginal discharge with or without bad odor. °· Penile discharge in men. °· Fever. °· Pain or bleeding during sexual intercourse. °· Swollen glands in the groin area. °· Yellow skin and eyes (jaundice). This is seen with hepatitis. °· Swollen testicles. °· Infertility. °· Sores and blisters in the mouth. °HOW ARE STDs DIAGNOSED? °To make a diagnosis, your health care provider may: °· Take a medical history. °· Perform a physical exam. °· Take a sample of any discharge to examine. °· Swab the throat,  cervix, opening to the penis, rectum, or vagina for testing. °· Test a sample of your first morning urine. °· Perform blood tests. °· Perform a Pap test, if this applies. °· Perform a colposcopy. °· Perform a laparoscopy. °HOW ARE STDs TREATED? °Treatment depends on the STD. Some STDs may be treated but not cured. °· Chlamydia, gonorrhea, trichomonas, and syphilis can be cured with antibiotic medicine. °· Genital herpes, hepatitis, and HIV can be treated, but not cured, with prescribed medicines. The medicines lessen symptoms. °· Genital warts from HPV can be treated with medicine or by freezing, burning (electrocautery), or surgery. Warts may come back. °· HPV cannot be cured with medicine or surgery. However, abnormal areas may be removed from the cervix, vagina, or vulva. °· If your diagnosis is confirmed, your recent sexual partners need treatment. This is true even if they are symptom-free or have a negative culture or evaluation. They should not have sex until their health care providers say it is okay. °· Your health care provider may test you for infection again 3 months after treatment. °HOW CAN I REDUCE MY RISK OF GETTING AN STD? °Take these steps to reduce your risk of getting an STD: °· Use latex condoms, dental dams, and water-soluble lubricants during sexual activity. Do not use petroleum jelly or oils. °· Avoid having multiple sex partners. °· Do not have sex with someone who has other sex partners °· Do not have sex with anyone you do not know or who is at high risk for an STD. °· Avoid risky sex practices that can break your skin. °· Do not have sex   if you have open sores on your mouth or skin. °· Avoid drinking too much alcohol or taking illegal drugs. Alcohol and drugs can affect your judgment and put you in a vulnerable position. °· Avoid engaging in oral and anal sex acts. °· Get vaccinated for HPV and hepatitis. If you have not received these vaccines in the past, talk to your health care  provider about whether one or both might be right for you. °· If you are at risk of being infected with HIV, it is recommended that you take a prescription medicine daily to prevent HIV infection. This is called pre-exposure prophylaxis (PrEP). You are considered at risk if: °¨ You are a man who has sex with other men (MSM). °¨ You are a heterosexual man or woman and are sexually active with more than one partner. °¨ You take drugs by injection. °¨ You are sexually active with a partner who has HIV. °· Talk with your health care provider about whether you are at high risk of being infected with HIV. If you choose to begin PrEP, you should first be tested for HIV. You should then be tested every 3 months for as long as you are taking PrEP. °WHAT SHOULD I DO IF I THINK I HAVE AN STD? °· See your health care provider. °· Tell your sexual partner(s). They should be tested and treated for any STDs. °· Do not have sex until your health care provider says it is okay. °WHEN SHOULD I GET IMMEDIATE MEDICAL CARE? °Contact your health care provider right away if:  °· You have severe abdominal pain. °· You are a man and notice swelling or pain in your testicles. °· You are a woman and notice swelling or pain in your vagina. °  °This information is not intended to replace advice given to you by your health care provider. Make sure you discuss any questions you have with your health care provider. °  °Document Released: 10/31/2002 Document Revised: 08/31/2014 Document Reviewed: 02/28/2013 °[employer]vier Interactive Patient Education ©2016 [employer] ° °

## 2016-02-19 NOTE — ED Notes (Signed)
Pt states was here on 6/22 DX chlamydia  , received tx yesterday for same, but did not receive HIV testing when she was here on 6/22

## 2016-02-21 LAB — RPR: RPR Ser Ql: NONREACTIVE

## 2016-02-21 LAB — HIV ANTIBODY (ROUTINE TESTING W REFLEX): HIV Screen 4th Generation wRfx: NONREACTIVE

## 2017-02-27 ENCOUNTER — Emergency Department (HOSPITAL_BASED_OUTPATIENT_CLINIC_OR_DEPARTMENT_OTHER): Payer: 59

## 2017-02-27 ENCOUNTER — Emergency Department (HOSPITAL_BASED_OUTPATIENT_CLINIC_OR_DEPARTMENT_OTHER)
Admission: EM | Admit: 2017-02-27 | Discharge: 2017-02-28 | Disposition: A | Discharge status: Home / Self Care | Payer: 59 | Attending: Emergency Medicine | Admitting: Emergency Medicine

## 2017-02-27 ENCOUNTER — Encounter (HOSPITAL_BASED_OUTPATIENT_CLINIC_OR_DEPARTMENT_OTHER): Payer: Self-pay | Admitting: Emergency Medicine

## 2017-02-27 DIAGNOSIS — R079 Chest pain, unspecified: Secondary | ICD-10-CM | POA: Diagnosis present

## 2017-02-27 DIAGNOSIS — R0789 Other chest pain: Secondary | ICD-10-CM

## 2017-02-27 DIAGNOSIS — I493 Ventricular premature depolarization: Secondary | ICD-10-CM

## 2017-02-27 DIAGNOSIS — Z79899 Other long term (current) drug therapy: Secondary | ICD-10-CM | POA: Diagnosis not present

## 2017-02-27 LAB — CBC WITH DIFFERENTIAL/PLATELET
BASOS ABS: 0 10*3/uL (ref 0.0–0.1)
BASOS PCT: 0 %
EOS ABS: 0.2 10*3/uL (ref 0.0–0.7)
EOS PCT: 2 %
HCT: 40.4 % (ref 36.0–46.0)
HEMOGLOBIN: 14 g/dL (ref 12.0–15.0)
LYMPHS ABS: 2.6 10*3/uL (ref 0.7–4.0)
Lymphocytes Relative: 33 %
MCH: 30 pg (ref 26.0–34.0)
MCHC: 34.7 g/dL (ref 30.0–36.0)
MCV: 86.7 fL (ref 78.0–100.0)
Monocytes Absolute: 0.7 10*3/uL (ref 0.1–1.0)
Monocytes Relative: 9 %
NEUTROS PCT: 56 %
Neutro Abs: 4.3 10*3/uL (ref 1.7–7.7)
PLATELETS: 177 10*3/uL (ref 150–400)
RBC: 4.66 MIL/uL (ref 3.87–5.11)
RDW: 13.2 % (ref 11.5–15.5)
WBC: 7.7 10*3/uL (ref 4.0–10.5)

## 2017-02-27 LAB — BASIC METABOLIC PANEL
ANION GAP: 9 (ref 5–15)
BUN: 19 mg/dL (ref 6–20)
CALCIUM: 9.4 mg/dL (ref 8.9–10.3)
CO2: 25 mmol/L (ref 22–32)
Chloride: 103 mmol/L (ref 101–111)
Creatinine, Ser: 0.94 mg/dL (ref 0.44–1.00)
Glucose, Bld: 93 mg/dL (ref 65–99)
Potassium: 3.2 mmol/L — ABNORMAL LOW (ref 3.5–5.1)
SODIUM: 137 mmol/L (ref 135–145)

## 2017-02-27 LAB — D-DIMER, QUANTITATIVE (NOT AT ARMC)

## 2017-02-27 LAB — PREGNANCY, URINE: PREG TEST UR: NEGATIVE

## 2017-02-27 LAB — TROPONIN I

## 2017-02-27 MED ORDER — POTASSIUM CHLORIDE CRYS ER 20 MEQ PO TBCR
40.0000 meq | EXTENDED_RELEASE_TABLET | Freq: Once | ORAL | Status: AC
  Administered 2017-02-27: 40 meq via ORAL
  Filled 2017-02-27: qty 2

## 2017-02-27 NOTE — ED Provider Notes (Signed)
[facility] Provider Note   CSN: [id number] Arrival date & time: 02/27/17  2200  By signing my name below, I, Thelma Barge, attest that this documentation has been prepared under the direction and in the presence of Pricilla Loveless, MD. Electronically Signed: Thelma Barge, Scribe. 02/27/17. 10:29 PM.  History   Chief Complaint Chief Complaint  Patient presents with  . Chest Pain  The history is provided by the patient. No language interpreter was used.   HPI Comments: Theresa Winters is a 31 y.o. female who presents to the Emergency Department complaining of waxing/waning, gradually worsening 6.5/10 CP for 3 days. She describes her chest gets tight and her heart "flutters". She states each episode lasts about 30 minutes but these have persisted all day for 3 days. She has associated HA and SOB but notes the SOB is chronic for her. She has not tried any medications for her symptoms and nothing makes the pain better or worse. She stopped taking birth control 2 weeks ago due to unrelated adverse symptoms like vaginal bleeding, HA, diaphoresis, and abdominal pain. She notes she has not been eating properly or staying hydrated. She denies cough, leg pain or swelling, and nausea. Pt does not smoke and is an occasional drinker. She has no pertinent medical histories. She denies any recent traveling or surgeries.  Past Medical History:  Diagnosis Date  . Scoliosis   . Scoliosis     There are no active problems to display for this patient.   Past Surgical History:  Procedure Laterality Date  . THERAPEUTIC ABORTION      OB History    Gravida Para Term Preterm AB Living   1             SAB TAB Ectopic Multiple Live Births                   Home Medications    Prior to Admission medications   Medication Sig Start Date End Date Taking? Authorizing Provider  azithromycin (ZITHROMAX) 1 g powder Take 1 g by mouth once.    [provider]  metroNIDAZOLE (FLAGYL) 500  MG tablet Take 1 tablet (500 mg total) by mouth 2 (two) times daily. 02/13/16   Eyvonne Mechanic, PA-C    Family History No family history on file.  Social History Social History  Substance Use Topics  . Smoking status: Never Smoker  . Smokeless tobacco: Never Used  . Alcohol use Yes     Comment: occ     Allergies   Patient has no known allergies.   Review of Systems Review of Systems  Respiratory: Positive for chest tightness and shortness of breath. Negative for cough.   Cardiovascular: Positive for chest pain. Negative for leg swelling.  Gastrointestinal: Negative for abdominal pain and nausea.  Musculoskeletal: Negative for myalgias.  Neurological: Positive for headaches.  All other systems reviewed and are negative.    Physical Exam Updated Vital Signs BP (!) 122/92   Pulse 67   Temp 98.3 F (36.8 C) (Oral)   Resp 12   Ht 5\' 7"  (1.702 m)   Wt 64.9 kg (143 lb)   LMP 02/06/2017   SpO2 100%   BMI 22.40 kg/m   Physical Exam  Constitutional: She is oriented to person, place, and time. She appears well-developed and well-nourished.  HENT:  Head: Normocephalic and atraumatic.  Right Ear: External ear normal.  Left Ear: External ear normal.  Nose: Nose normal.  Eyes: Right eye exhibits  no discharge. Left eye exhibits no discharge.  Cardiovascular: Normal rate, regular rhythm and normal heart sounds.   2+ radial pulses bilaterally Single PVC seen on the monitor which correlated to a brief episode of pain for the patient.  Pulmonary/Chest: Effort normal and breath sounds normal.  Abdominal: Soft. There is no tenderness.  Musculoskeletal: She exhibits no edema.  Neurological: She is alert and oriented to person, place, and time.  Skin: Skin is warm and dry.  Nursing note and vitals reviewed.    ED Treatments / Results  DIAGNOSTIC STUDIES: Oxygen Saturation is 100% on RA, normal by my interpretation.    COORDINATION OF CARE: 10:27 PM Discussed treatment  plan with pt at bedside and pt agreed to plan.  Labs (all labs ordered are listed, but only abnormal results are displayed) Labs Reviewed  BASIC METABOLIC PANEL - Abnormal; Notable for the following:       Result Value   Potassium 3.2 (*)    All other components within normal limits  TROPONIN I  CBC WITH DIFFERENTIAL/PLATELET  D-DIMER, QUANTITATIVE (NOT AT [facility])  PREGNANCY, URINE    EKG  EKG Interpretation  Date/Time:  Saturday February 27 2017 22:11:03 EDT Ventricular Rate:  75 PR Interval:    QRS Duration: 110 QT Interval:  371 QTC Calculation: 415 R Axis:   50 Text Interpretation:  Sinus rhythm Ventricular premature complex RSR' in V1 or V2, probably normal variant no significant change since 2016 Confirmed by Pricilla Loveless [id number]) on 02/27/2017 10:14:46 PM       Radiology Dg Chest 2 View  Result Date: 02/27/2017 CLINICAL DATA:  Acute onset of generalized chest pain and palpitations. Initial encounter. EXAM: CHEST  2 VIEW COMPARISON:  Chest radiograph performed 10/08/2014 FINDINGS: The lungs are well-aerated and clear. There is no evidence of focal opacification, pleural effusion or pneumothorax. The heart is normal in size; the mediastinal contour is within normal limits. No acute osseous abnormalities are seen. Left convex thoracolumbar scoliosis is noted. IMPRESSION: 1. No acute cardiopulmonary process seen. 2. Left convex thoracolumbar scoliosis noted. Electronically Signed   By: Roanna Raider M.D.   On: 02/27/2017 22:46    Procedures Procedures (including critical care time)  Medications Ordered in ED Medications  potassium chloride SA (K-DUR,KLOR-CON) CR tablet 40 mEq (40 mEq Oral Given 02/27/17 2325)     Initial Impression / Assessment and Plan / ED Course  I have reviewed the triage vital signs and the nursing notes.  Pertinent labs & imaging results that were available during my care of the patient were reviewed by me and considered in my medical decision making  (see chart for details).     Patient's pain could be coming from symptomatic PVCs. They're very brief and while is in the room she had an episode of pain the correlated to the time when I heard and saw a PVC on the monitor. However the same time it has not recurred since and so it's unclear if this was once dental or a cause. Her potassium is mildly low and so was repleted in the ED. She will be referred to cardiology for what could be symptomatically PVCs. Otherwise has quite atypical chest pain and I highly doubt ACS. Low risk for PE but she has recently been on a birth control pill and so d-dimer was sent and is negative. I do not think further workup is needed. The symptoms been ongoing for about 72 hours, with no ECG changes and normal troponin I do  not think further testing is needed. Discharge with return precautions.  Final Clinical Impressions(s) / ED Diagnoses   Final diagnoses:  Atypical chest pain  PVCs (premature ventricular contractions)    New Prescriptions New Prescriptions   No medications on file   I personally performed the services described in this documentation, which was scribed in my presence. The recorded information has been reviewed and is accurate.     Pricilla LovelessGoldston, Redith Drach, MD 02/27/17 [phone]

## 2017-02-27 NOTE — ED Triage Notes (Signed)
Pt presents to ED with c/o intermittent chest pain for the past 3-4 days. PT states she feels like her heart is skipping a beat.

## 2017-05-26 ENCOUNTER — Encounter (HOSPITAL_BASED_OUTPATIENT_CLINIC_OR_DEPARTMENT_OTHER): Payer: Self-pay | Admitting: Emergency Medicine

## 2017-05-26 ENCOUNTER — Emergency Department (HOSPITAL_BASED_OUTPATIENT_CLINIC_OR_DEPARTMENT_OTHER)
Admission: EM | Admit: 2017-05-26 | Discharge: 2017-05-26 | Disposition: A | Discharge status: Home / Self Care | Payer: 59 | Attending: Emergency Medicine | Admitting: Emergency Medicine

## 2017-05-26 DIAGNOSIS — B9689 Other specified bacterial agents as the cause of diseases classified elsewhere: Secondary | ICD-10-CM

## 2017-05-26 DIAGNOSIS — M545 Low back pain, unspecified: Secondary | ICD-10-CM

## 2017-05-26 DIAGNOSIS — N760 Acute vaginitis: Secondary | ICD-10-CM | POA: Diagnosis not present

## 2017-05-26 LAB — WET PREP, GENITAL
SPERM: NONE SEEN
Trich, Wet Prep: NONE SEEN
YEAST WET PREP: NONE SEEN

## 2017-05-26 LAB — CBC WITH DIFFERENTIAL/PLATELET
BASOS PCT: 0 %
Basophils Absolute: 0 10*3/uL (ref 0.0–0.1)
Eosinophils Absolute: 0.2 10*3/uL (ref 0.0–0.7)
Eosinophils Relative: 2 %
HCT: 37.7 % (ref 36.0–46.0)
HEMOGLOBIN: 12.9 g/dL (ref 12.0–15.0)
Lymphocytes Relative: 28 %
Lymphs Abs: 2.1 10*3/uL (ref 0.7–4.0)
MCH: 29.7 pg (ref 26.0–34.0)
MCHC: 34.2 g/dL (ref 30.0–36.0)
MCV: 86.9 fL (ref 78.0–100.0)
Monocytes Absolute: 0.8 10*3/uL (ref 0.1–1.0)
Monocytes Relative: 10 %
NEUTROS ABS: 4.5 10*3/uL (ref 1.7–7.7)
NEUTROS PCT: 60 %
Platelets: 189 10*3/uL (ref 150–400)
RBC: 4.34 MIL/uL (ref 3.87–5.11)
RDW: 13 % (ref 11.5–15.5)
WBC: 7.6 10*3/uL (ref 4.0–10.5)

## 2017-05-26 LAB — COMPREHENSIVE METABOLIC PANEL
ALK PHOS: 46 U/L (ref 38–126)
ALT: 12 U/L — ABNORMAL LOW (ref 14–54)
ANION GAP: 6 (ref 5–15)
AST: 19 U/L (ref 15–41)
Albumin: 4.4 g/dL (ref 3.5–5.0)
BILIRUBIN TOTAL: 0.4 mg/dL (ref 0.3–1.2)
BUN: 15 mg/dL (ref 6–20)
CALCIUM: 9.2 mg/dL (ref 8.9–10.3)
CO2: 26 mmol/L (ref 22–32)
Chloride: 104 mmol/L (ref 101–111)
Creatinine, Ser: 0.91 mg/dL (ref 0.44–1.00)
Glucose, Bld: 99 mg/dL (ref 65–99)
Potassium: 4.3 mmol/L (ref 3.5–5.1)
SODIUM: 136 mmol/L (ref 135–145)
TOTAL PROTEIN: 7.4 g/dL (ref 6.5–8.1)

## 2017-05-26 LAB — URINALYSIS, MICROSCOPIC (REFLEX)

## 2017-05-26 LAB — URINALYSIS, ROUTINE W REFLEX MICROSCOPIC
BILIRUBIN URINE: NEGATIVE
Glucose, UA: NEGATIVE mg/dL
Ketones, ur: NEGATIVE mg/dL
Leukocytes, UA: NEGATIVE
Nitrite: NEGATIVE
Protein, ur: NEGATIVE mg/dL
pH: 6 (ref 5.0–8.0)

## 2017-05-26 LAB — PREGNANCY, URINE: Preg Test, Ur: NEGATIVE

## 2017-05-26 MED ORDER — IBUPROFEN 800 MG PO TABS: 800.0000 mg | ORAL_TABLET | Freq: Three times a day (TID) | ORAL | 0 refills | Status: DC | PRN

## 2017-05-26 NOTE — ED Provider Notes (Signed)
Emergency Department Provider Note   I have reviewed the triage vital signs and the nursing notes.   HISTORY  Chief Complaint Back Pain   HPI Theresa Winters is a 31 y.o. female patient presents emergency department for evaluation of left-sided back pain for the past 3 weeks. She says some increased urinary frequency but no dysuria or hesitancy. She said some mild headaches and vaginal discharge. Patient states she has a Tehila Sokolow history of bacterial vaginosis but feels like her vaginal symptoms are mild at this time. No vaginal bleeding. No lower abdominal pain. No concern for std exposure. No blood in the urine. No fever or chills. No radiation of pain. Pain slightly worse with movement/position changes. Patient reports intermittently dark urine worse in the AM or when not drinking lots of water.    Past Medical History:  Diagnosis Date  . Scoliosis   . Scoliosis     There are no active problems to display for this patient.   Past Surgical History:  Procedure Laterality Date  . THERAPEUTIC ABORTION      Current Outpatient Rx  . Order #: [id number] Class: Historical Med  . Order #: [id number] Class: Print  . Order #: [id number] Class: Print    Allergies Patient has no known allergies.  No family history on file.  Social History Social History  Substance Use Topics  . Smoking status: Never Smoker  . Smokeless tobacco: Never Used  . Alcohol use Yes     Comment: occ    Review of Systems  Constitutional: No fever/chills Eyes: No visual changes. ENT: No sore throat. Cardiovascular: Denies chest pain. Respiratory: Denies shortness of breath. Gastrointestinal: No abdominal pain.  No nausea, no vomiting.  No diarrhea.  No constipation. Genitourinary: Negative for dysuria. Positive mild vaginal discharge. Positive urinary frequency and dark urine worse in the AM.  Musculoskeletal: Positive for back pain. Skin: Negative for rash. Neurological: Negative for headaches,  focal weakness or numbness.  10-point ROS otherwise negative.  ____________________________________________   PHYSICAL EXAM:  VITAL SIGNS: ED Triage Vitals  Enc Vitals Group     BP 05/26/17 1714 118/88     Pulse Rate 05/26/17 1714 74     Resp 05/26/17 1714 18     Temp 05/26/17 1714 98.2 F (36.8 C)     Temp Source 05/26/17 1714 Oral     SpO2 05/26/17 1714 100 %     Weight 05/26/17 1715 143 lb (64.9 kg)     Height 05/26/17 1715  (1.702 m)     Pain Score 05/26/17 1714 8   Constitutional: Alert and oriented. Well appearing and in no acute distress. Eyes: Conjunctivae are normal.  Head: Atraumatic. Nose: No congestion/rhinnorhea. Mouth/Throat: Mucous membranes are moist.  Neck: No stridor.   Cardiovascular: Normal rate, regular rhythm. Good peripheral circulation. Grossly normal heart sounds.   Respiratory: Normal respiratory effort.  No retractions. Lungs CTAB. Gastrointestinal: Soft and nontender. No distention.  Genitourinary: Mild vaginal discharge. No vaginal bleeding. Normal external genitalia. No CMT or adnexal tenderness or fullness.  Musculoskeletal: No lower extremity tenderness nor edema. No gross deformities of extremities. Positive left lower back tenderness to percussion.  Neurologic:  Normal speech and language. No gross focal neurologic deficits are appreciated.  Skin:  Skin is warm, dry and intact. No rash noted.  ____________________________________________   LABS (all labs ordered are listed, but only abnormal results are displayed)  Labs Reviewed  WET PREP, GENITAL - Abnormal; Notable for the following:  Result Value   Clue Cells Wet Prep HPF POC PRESENT (*)    WBC, Wet Prep HPF POC MODERATE (*)    All other components within normal limits  URINALYSIS, ROUTINE W REFLEX MICROSCOPIC - Abnormal; Notable for the following:    Specific Gravity, Urine <1.005 (*)    Hgb urine dipstick LARGE (*)    All other components within normal limits    COMPREHENSIVE METABOLIC PANEL - Abnormal; Notable for the following:    ALT 12 (*)    All other components within normal limits  URINALYSIS, MICROSCOPIC (REFLEX) - Abnormal; Notable for the following:    Bacteria, UA FEW (*)    Squamous Epithelial / LPF 0-5 (*)    All other components within normal limits  PREGNANCY, URINE  CBC WITH DIFFERENTIAL/PLATELET  GC/CHLAMYDIA PROBE AMP ([place]) NOT AT [facility]    ____________________________________________   PROCEDURES  Procedure(s) performed:   Procedures  None ____________________________________________   INITIAL IMPRESSION / ASSESSMENT AND PLAN / ED COURSE  Pertinent labs & imaging results that were available during my care of the patient were reviewed by me and considered in my medical decision making (see chart for details).  Patient presents to the ED with back pain for the last 3 weeks and increased urine frequency with dark urine in the AM and when mildly dehydrated. Mild tenderness to percussion over the left kidney. UA with no infection but some blood. Considered kidney stone but symptoms are mild and variable over the last 3 weeks. No evidence to suggest complicated kidney stone on labs, UA, or history. Pelvic exam unremarkable. Soft abdomen. Plan for supportive care and PCP follow up. Patient with suppositories for BV at home which she will take.   At this time, I do not feel there is any life-threatening condition present. I have reviewed and discussed all results (EKG, imaging, lab, urine as appropriate), exam findings with patient. I have reviewed nursing notes and appropriate previous records.  I feel the patient is safe to be discharged home without further emergent workup. Discussed usual and customary return precautions. Patient and family (if present) verbalize understanding and are comfortable with this plan.  Patient will follow-up with their primary care provider. If they do not have a primary care provider,  information for follow-up has been provided to them. All questions have been answered.  ____________________________________________  FINAL CLINICAL IMPRESSION(S) / ED DIAGNOSES  Final diagnoses:  Acute left-sided low back pain without sciatica  Bacterial vaginosis     MEDICATIONS GIVEN DURING THIS VISIT:  Medications - No data to display   NEW OUTPATIENT MEDICATIONS STARTED DURING THIS VISIT:  Discharge Medication List as of 05/26/2017  7:12 PM    START taking these medications   Details  ibuprofen (ADVIL,MOTRIN) 800 MG tablet Take 1 tablet (800 mg total) by mouth every 8 (eight) hours as needed., Starting Wed 05/26/2017, Print        Note:  This document was prepared using Dragon voice recognition software and may include unintentional dictation errors.  Alona Bene, MD Emergency Medicine    Constancia Geeting, Arlyss Repress, MD 05/27/17 [phone]

## 2017-05-26 NOTE — ED Triage Notes (Signed)
Back pain x 3 weeks, also concerned about UTI due to increased urinary frequency. Also reports headaches vaginal discharge.

## 2017-05-26 NOTE — Discharge Instructions (Signed)

## 2017-05-27 LAB — GC/CHLAMYDIA PROBE AMP (~~LOC~~) NOT AT ARMC
Chlamydia: NEGATIVE
Neisseria Gonorrhea: NEGATIVE

## 2017-08-17 ENCOUNTER — Other Ambulatory Visit: Payer: Self-pay

## 2017-08-17 ENCOUNTER — Encounter (HOSPITAL_BASED_OUTPATIENT_CLINIC_OR_DEPARTMENT_OTHER): Payer: Self-pay | Admitting: *Deleted

## 2017-08-17 ENCOUNTER — Emergency Department (HOSPITAL_BASED_OUTPATIENT_CLINIC_OR_DEPARTMENT_OTHER)
Admission: EM | Admit: 2017-08-17 | Discharge: 2017-08-17 | Disposition: A | Discharge status: Home / Self Care | Payer: Medicaid Other | Attending: Emergency Medicine | Admitting: Emergency Medicine

## 2017-08-17 DIAGNOSIS — Z79899 Other long term (current) drug therapy: Secondary | ICD-10-CM | POA: Diagnosis not present

## 2017-08-17 DIAGNOSIS — I493 Ventricular premature depolarization: Secondary | ICD-10-CM

## 2017-08-17 DIAGNOSIS — R002 Palpitations: Secondary | ICD-10-CM | POA: Diagnosis present

## 2017-08-17 DIAGNOSIS — E876 Hypokalemia: Secondary | ICD-10-CM

## 2017-08-17 LAB — BASIC METABOLIC PANEL
Anion gap: 8 (ref 5–15)
BUN: 22 mg/dL — AB (ref 6–20)
CHLORIDE: 106 mmol/L (ref 101–111)
CO2: 26 mmol/L (ref 22–32)
CREATININE: 1.16 mg/dL — AB (ref 0.44–1.00)
Calcium: 9 mg/dL (ref 8.9–10.3)
GFR calc Af Amer: >60 mL/min (ref >60)
GFR calc non Af Amer: >60 mL/min (ref >60)
Glucose, Bld: 80 mg/dL (ref 65–99)
POTASSIUM: 3.3 mmol/L — AB (ref 3.5–5.1)
Sodium: 140 mmol/L (ref 135–145)

## 2017-08-17 LAB — MAGNESIUM: MAGNESIUM: 1.9 mg/dL (ref 1.7–2.4)

## 2017-08-17 LAB — PREGNANCY, URINE: Preg Test, Ur: NEGATIVE

## 2017-08-17 MED ORDER — POTASSIUM CHLORIDE CRYS ER 20 MEQ PO TBCR
40.0000 meq | EXTENDED_RELEASE_TABLET | Freq: Once | ORAL | Status: AC
  Administered 2017-08-17: 40 meq via ORAL
  Filled 2017-08-17: qty 2

## 2017-08-17 MED ORDER — METOPROLOL TARTRATE 50 MG PO TABS
25.0000 mg | ORAL_TABLET | Freq: Once | ORAL | Status: AC
  Administered 2017-08-17: 25 mg via ORAL
  Filled 2017-08-17: qty 1

## 2017-08-17 MED ORDER — METOPROLOL TARTRATE 25 MG PO TABS: 25.0000 mg | ORAL_TABLET | Freq: Every evening | ORAL | 0 refills | Status: DC | PRN

## 2017-08-17 MED ORDER — SODIUM CHLORIDE 0.9 % IV BOLUS (SEPSIS)
1000.0000 mL | Freq: Once | INTRAVENOUS | Status: AC
  Administered 2017-08-17: 1000 mL via INTRAVENOUS

## 2017-08-17 MED ORDER — POTASSIUM CHLORIDE ER 10 MEQ PO TBCR: 10.0000 meq | EXTENDED_RELEASE_TABLET | Freq: Two times a day (BID) | ORAL | 0 refills | Status: DC

## 2017-08-17 NOTE — ED Triage Notes (Signed)
Palpitations x 3 days. States she has been vomiting since this am and feels dehydrated. She feels faint. Ambulatory to triage with no difficulty.

## 2017-08-17 NOTE — ED Provider Notes (Addendum)
[facility] EMERGENCY DEPARTMENT Provider Note   CSN: [id number] Arrival date & time: 08/17/17  1932     History   Chief Complaint Chief Complaint  Patient presents with  . Palpitations    HPI Theresa Winters is a 31 y.o. female. Chief complaint is irregular heartbeat.  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 History of PVCs. Had a previous ER visit describing palpitations and was documented with PVCs on EKG that reproduced her symptoms. Has not had them for several months. Sees been sleeping poorly for the last 2-3 days having irregular heartbeats keep her up at night. Episode of vomiting this morning. Had a few mixed drinks yesterday and one today as well. Having the sensation of irregular heartbeats now several times per minute.  No diarrhea. Denies pregnancy. No vomiting since this morning. Ate a regular meal today.  No over-the-counter cough or cold medicines. No excess caffeine. More alcohol than usual. Poor sleep.  Past Medical History:  Diagnosis Date  . Scoliosis   . Scoliosis     There are no active problems to display for this patient.   Past Surgical History:  Procedure Laterality Date  . THERAPEUTIC ABORTION      OB History    Gravida Para Term Preterm AB Living   1             SAB TAB Ectopic Multiple Live Births                   Home Medications    Prior to Admission medications   Medication Sig Start Date End Date Taking? Authorizing Provider  azithromycin (ZITHROMAX) 1 g powder Take 1 g by mouth once.    [provider]  ibuprofen (ADVIL,MOTRIN) 800 MG tablet Take 1 tablet (800 mg total) by mouth every 8 (eight) hours as needed. 05/26/17   Long, Arlyss RepressJoshua G, MD  metoprolol tartrate (LOPRESSOR) 25 MG tablet Take 1 tablet (25 mg total) by mouth at bedtime as needed (Palpitations). 08/17/17   Rolland PorterJames, Geovonni Meyerhoff, MD  metroNIDAZOLE (FLAGYL) 500 MG tablet Take 1 tablet (500 mg total) by mouth 2 (two) times daily. 02/13/16   Hedges, Tinnie GensJeffrey, PA-C  potassium  chloride (K-DUR) 10 MEQ tablet Take 1 tablet (10 mEq total) by mouth 2 (two) times daily. 08/17/17   Rolland PorterJames, Teofila Bowery, MD    Family History No family history on file.  Social History Social History   Tobacco Use  . Smoking status: Never Smoker  . Smokeless tobacco: Never Used  Substance Use Topics  . Alcohol use: Yes    Comment: occ  . Drug use: No     Allergies   Patient has no known allergies.   Review of Systems Review of Systems  Constitutional: Negative for appetite change, chills, diaphoresis, fatigue and fever.  HENT: Negative for mouth sores, sore throat and trouble swallowing.   Eyes: Negative for visual disturbance.  Respiratory: Negative for cough, chest tightness, shortness of breath and wheezing.   Cardiovascular: Positive for palpitations. Negative for chest pain.  Gastrointestinal: Negative for abdominal distention, abdominal pain, diarrhea, nausea and vomiting.  Endocrine: Negative for polydipsia, polyphagia and polyuria.  Genitourinary: Negative for dysuria, frequency and hematuria.  Musculoskeletal: Negative for gait problem.  Skin: Negative for color change, pallor and rash.  Neurological: Negative for dizziness, syncope, light-headedness and headaches.  Hematological: Does not bruise/bleed easily.  Psychiatric/Behavioral: Negative for behavioral problems and confusion.     Physical Exam Updated Vital Signs BP 133/88 (BP Location: Left Arm)  Pulse 70   Temp 98.5 F (36.9 C) (Oral)   Resp 18   Ht 5\' 7"  (1.702 m)   Wt 64.9 kg (143 lb)   LMP 07/31/2017   SpO2 100%   BMI 22.40 kg/m   Physical Exam  Constitutional: She is oriented to person, place, and time. She appears well-developed and well-nourished. No distress.  HENT:  Head: Normocephalic.  Eyes: Conjunctivae are normal. Pupils are equal, round, and reactive to light. No scleral icterus.  Neck: Normal range of motion. Neck supple. No thyromegaly present.  Cardiovascular: Normal rate and  regular rhythm. Exam reveals no gallop and no friction rub.  No murmur heard. Sinus. Occasional PVCs on the monitor which exactly reproduce her symptoms. Because membranes are moist. She does not appear anemic or pale conjunctiva. No murmurs. Clear lungs.  Pulmonary/Chest: Effort normal and breath sounds normal. No respiratory distress. She has no wheezes. She has no rales.  Abdominal: Soft. Bowel sounds are normal. She exhibits no distension. There is no tenderness. There is no rebound.  Musculoskeletal: Normal range of motion.  Neurological: She is alert and oriented to person, place, and time.  Skin: Skin is warm and dry. No rash noted.  Psychiatric: She has a normal mood and affect. Her behavior is normal.     ED Treatments / Results  Labs (all labs ordered are listed, but only abnormal results are displayed) Labs Reviewed  BASIC METABOLIC PANEL - Abnormal; Notable for the following components:      Result Value   Potassium 3.3 (*)    BUN 22 (*)    Creatinine, Ser 1.16 (*)    All other components within normal limits  MAGNESIUM  PREGNANCY, URINE    EKG  EKG Interpretation None       Radiology No results found.  Procedures Procedures (including critical care time)  Medications Ordered in ED Medications  metoprolol tartrate (LOPRESSOR) tablet 25 mg (25 mg Oral Given 08/17/17 2009)  sodium chloride 0.9 % bolus 1,000 mL (1,000 mLs Intravenous New Bag/Given 08/17/17 2015)  potassium chloride SA (K-DUR,KLOR-CON) CR tablet 40 mEq (40 mEq Oral Given 08/17/17 2038)     Initial Impression / Assessment and Plan / ED Course  I have reviewed the triage vital signs and the nursing notes.  Pertinent labs & imaging results that were available during my care of the patient were reviewed by me and considered in my medical decision making (see chart for details).    IV placed by nursing prior to my evaluation. We'll check a sick metabolic, magnesium. Plan IV fluids. By mouth  metoprolol for suppression of her PVCs. We'll reevaluate. Will likely simply needs short course of daily at bedtime metoprolol. Avoid caffeine, alcohol, sleep deprivation. Stay hydrated. Cardiology follow-up for Holter monitoring to determine need for medications or interventions. Likely simple benign PVCs exacerbated by diet and social changes.  Final Clinical Impressions(s) / ED Diagnoses   Final diagnoses:  PVC (premature ventricular contraction)  Hypokalemia   PVCs resolved after the treatment. Plan when necessary daily at bedtime Lopressor. Avoid alcohol and caffeine. Stay hydrated. Potassium replacement for next 3 days.   ED Discharge Orders        Ordered    potassium chloride (K-DUR) 10 MEQ tablet  2 times daily     08/17/17 2039    metoprolol tartrate (LOPRESSOR) 25 MG tablet  At bedtime PRN     08/17/17 2039       Rolland PorterJames, Kendrix Orman, MD 08/17/17 2041  Rolland Porter, MD 08/17/17 2049

## 2017-08-17 NOTE — ED Notes (Signed)
Pt states she no longer feels palpitations.

## 2017-08-17 NOTE — Discharge Instructions (Signed)
°  Called cardiologist for follow-up appointment Prescriptions as written. Avoid caffeine and alcohol.

## 2017-08-27 ENCOUNTER — Emergency Department (HOSPITAL_BASED_OUTPATIENT_CLINIC_OR_DEPARTMENT_OTHER)
Admission: EM | Admit: 2017-08-27 | Discharge: 2017-08-27 | Disposition: A | Discharge status: Home / Self Care | Payer: Medicaid Other | Attending: Emergency Medicine | Admitting: Emergency Medicine

## 2017-08-27 ENCOUNTER — Encounter (HOSPITAL_BASED_OUTPATIENT_CLINIC_OR_DEPARTMENT_OTHER): Payer: Self-pay | Admitting: Emergency Medicine

## 2017-08-27 ENCOUNTER — Other Ambulatory Visit: Payer: Self-pay

## 2017-08-27 DIAGNOSIS — Z5321 Procedure and treatment not carried out due to patient leaving prior to being seen by health care provider: Secondary | ICD-10-CM | POA: Insufficient documentation

## 2017-08-27 DIAGNOSIS — R51 Headache: Secondary | ICD-10-CM | POA: Diagnosis not present

## 2017-08-27 NOTE — ED Notes (Signed)
Pt called X1 to a room. No answer from the waiting room.

## 2017-08-27 NOTE — ED Notes (Signed)
No answer x3

## 2017-08-27 NOTE — ED Notes (Signed)
No answer x2 

## 2017-08-27 NOTE — ED Triage Notes (Signed)
Patient reports she was hear on christmas due to heart palpitations.  Reports diagnosed with hypokalemia.  Reports headache and states she hasn't slept since she began taking potassium supplements and doesn't eat due to not having appetite.  Reports she feels like she's going to pass out.

## 2017-09-17 ENCOUNTER — Encounter (HOSPITAL_BASED_OUTPATIENT_CLINIC_OR_DEPARTMENT_OTHER): Payer: Self-pay | Admitting: *Deleted

## 2017-09-17 DIAGNOSIS — R42 Dizziness and giddiness: Secondary | ICD-10-CM | POA: Insufficient documentation

## 2017-09-17 DIAGNOSIS — Z5321 Procedure and treatment not carried out due to patient leaving prior to being seen by health care provider: Secondary | ICD-10-CM | POA: Insufficient documentation

## 2017-09-17 LAB — URINALYSIS, ROUTINE W REFLEX MICROSCOPIC
Bilirubin Urine: NEGATIVE
GLUCOSE, UA: NEGATIVE mg/dL
KETONES UR: NEGATIVE mg/dL
Leukocytes, UA: NEGATIVE
Nitrite: NEGATIVE
PH: 6 (ref 5.0–8.0)
PROTEIN: NEGATIVE mg/dL
Specific Gravity, Urine: 1.02 (ref 1.005–1.030)

## 2017-09-17 LAB — CBC
HCT: 39.4 % (ref 36.0–46.0)
Hemoglobin: 13.6 g/dL (ref 12.0–15.0)
MCH: 30 pg (ref 26.0–34.0)
MCHC: 34.5 g/dL (ref 30.0–36.0)
MCV: 86.8 fL (ref 78.0–100.0)
PLATELETS: 201 10*3/uL (ref 150–400)
RBC: 4.54 MIL/uL (ref 3.87–5.11)
RDW: 14.3 % (ref 11.5–15.5)
WBC: 8.9 10*3/uL (ref 4.0–10.5)

## 2017-09-17 LAB — BASIC METABOLIC PANEL
Anion gap: 8 (ref 5–15)
BUN: 19 mg/dL (ref 6–20)
CHLORIDE: 105 mmol/L (ref 101–111)
CO2: 24 mmol/L (ref 22–32)
CREATININE: 1.13 mg/dL — AB (ref 0.44–1.00)
Calcium: 9.1 mg/dL (ref 8.9–10.3)
GFR calc Af Amer: >60 mL/min (ref >60)
GFR calc non Af Amer: >60 mL/min (ref >60)
GLUCOSE: 81 mg/dL (ref 65–99)
Potassium: 3.5 mmol/L (ref 3.5–5.1)
Sodium: 137 mmol/L (ref 135–145)

## 2017-09-17 LAB — URINALYSIS, MICROSCOPIC (REFLEX)

## 2017-09-17 LAB — PREGNANCY, URINE: Preg Test, Ur: NEGATIVE

## 2017-09-17 NOTE — ED Triage Notes (Signed)
Pt reports for two weeks she has felt dizzy, headaches and shortness of breath. No acute distress, steady gait to triage.

## 2017-09-18 ENCOUNTER — Emergency Department (HOSPITAL_BASED_OUTPATIENT_CLINIC_OR_DEPARTMENT_OTHER)
Admission: EM | Admit: 2017-09-18 | Discharge: 2017-09-18 | Disposition: A | Discharge status: Home / Self Care | Payer: Medicaid Other | Attending: Emergency Medicine | Admitting: Emergency Medicine

## 2017-09-18 NOTE — ED Notes (Signed)
Pt called X2. No answer from lobby.

## 2017-09-18 NOTE — ED Notes (Signed)
PAGED IN LOBBY. NO ANSWER. 

## 2017-09-19 ENCOUNTER — Other Ambulatory Visit: Payer: Self-pay

## 2017-09-19 ENCOUNTER — Encounter (HOSPITAL_BASED_OUTPATIENT_CLINIC_OR_DEPARTMENT_OTHER): Payer: Self-pay | Admitting: Emergency Medicine

## 2017-09-19 ENCOUNTER — Emergency Department (HOSPITAL_BASED_OUTPATIENT_CLINIC_OR_DEPARTMENT_OTHER)
Admission: EM | Admit: 2017-09-19 | Discharge: 2017-09-19 | Discharge status: Against Medical Advice | Payer: Medicaid Other | Attending: Emergency Medicine | Admitting: Emergency Medicine

## 2017-09-19 DIAGNOSIS — R42 Dizziness and giddiness: Secondary | ICD-10-CM | POA: Insufficient documentation

## 2017-09-19 DIAGNOSIS — Z5321 Procedure and treatment not carried out due to patient leaving prior to being seen by health care provider: Secondary | ICD-10-CM | POA: Diagnosis not present

## 2017-09-19 NOTE — ED Triage Notes (Signed)
Pt reports dizziness x 1 wk and a sharp intermittent pain to top of head

## 2017-09-20 ENCOUNTER — Emergency Department (HOSPITAL_BASED_OUTPATIENT_CLINIC_OR_DEPARTMENT_OTHER)
Admission: EM | Admit: 2017-09-20 | Discharge: 2017-09-20 | Disposition: A | Discharge status: Home / Self Care | Payer: Medicaid Other | Attending: Emergency Medicine | Admitting: Emergency Medicine

## 2017-09-20 ENCOUNTER — Encounter (HOSPITAL_BASED_OUTPATIENT_CLINIC_OR_DEPARTMENT_OTHER): Payer: Self-pay | Admitting: *Deleted

## 2017-09-20 ENCOUNTER — Other Ambulatory Visit: Payer: Self-pay

## 2017-09-20 DIAGNOSIS — Z79899 Other long term (current) drug therapy: Secondary | ICD-10-CM | POA: Insufficient documentation

## 2017-09-20 DIAGNOSIS — R519 Headache, unspecified: Secondary | ICD-10-CM

## 2017-09-20 DIAGNOSIS — R51 Headache: Secondary | ICD-10-CM | POA: Diagnosis not present

## 2017-09-20 DIAGNOSIS — R42 Dizziness and giddiness: Secondary | ICD-10-CM | POA: Diagnosis not present

## 2017-09-20 MED ORDER — SODIUM CHLORIDE 0.9 % IV BOLUS (SEPSIS)
1000.0000 mL | Freq: Once | INTRAVENOUS | Status: AC
  Administered 2017-09-20: 1000 mL via INTRAVENOUS

## 2017-09-20 MED ORDER — DIPHENHYDRAMINE HCL 50 MG/ML IJ SOLN: 12.5000 mg | Freq: Once | INTRAMUSCULAR | Status: DC

## 2017-09-20 MED ORDER — KETOROLAC TROMETHAMINE 30 MG/ML IJ SOLN
30.0000 mg | Freq: Once | INTRAMUSCULAR | Status: AC
  Administered 2017-09-20: 30 mg via INTRAVENOUS
  Filled 2017-09-20: qty 1

## 2017-09-20 MED ORDER — PROCHLORPERAZINE EDISYLATE 5 MG/ML IJ SOLN
10.0000 mg | Freq: Once | INTRAMUSCULAR | Status: AC
  Administered 2017-09-20: 10 mg via INTRAVENOUS
  Filled 2017-09-20: qty 2

## 2017-09-20 NOTE — Discharge Instructions (Signed)
It was my pleasure taking care of you today!  Drink plenty of fluids at home. This will help with your headache.  Please follow up with your primary care provider.    If you develop worsening headache, new fever, new neck stiffness, rash, weakness, numbness, trouble with your speech, trouble walking, new or worsening symptoms or any concerning symptoms, please return to the ED immediately.

## 2017-09-20 NOTE — ED Provider Notes (Signed)
[facility] EMERGENCY DEPARTMENT Provider Note   CSN: [id number] Arrival date & time: 09/20/17  1119     History   Chief Complaint Chief Complaint  Patient presents with  . Dizziness  . Headache    HPI Theresa Winters is a 32 y.o. female.  The history is provided by the patient and medical records. No language interpreter was used.  Dizziness  Associated symptoms: headaches   Associated symptoms: no weakness   Headache     Theresa Winters is a 32 y.o. female who presents to the Emergency Department complaining of headache x1 week.  Pain is left-sided and throbbing.  She reports it has been constant, worse today than it has been.  She does endorse intermittent dizziness described as room spinning over the last 2 weeks.  She is currently not dizzy and has not been dizzy today.  She has no associated vision changes, slurred speech, neck pain, fever, chills, nausea, vomiting, abdominal pain, back pain or urinary symptoms.  LMP 1/4.   Past Medical History:  Diagnosis Date  . Scoliosis   . Scoliosis     There are no active problems to display for this patient.   Past Surgical History:  Procedure Laterality Date  . THERAPEUTIC ABORTION      OB History    Gravida Para Term Preterm AB Living   1             SAB TAB Ectopic Multiple Live Births                   Home Medications    Prior to Admission medications   Medication Sig Start Date End Date Taking? Authorizing Provider  azithromycin (ZITHROMAX) 1 g powder Take 1 g by mouth once.    [provider]  ibuprofen (ADVIL,MOTRIN) 800 MG tablet Take 1 tablet (800 mg total) by mouth every 8 (eight) hours as needed. 05/26/17   Long, Arlyss Repress, MD  metoprolol tartrate (LOPRESSOR) 25 MG tablet Take 1 tablet (25 mg total) by mouth at bedtime as needed (Palpitations). 08/17/17   Rolland Porter, MD  metroNIDAZOLE (FLAGYL) 500 MG tablet Take 1 tablet (500 mg total) by mouth 2 (two) times daily. 02/13/16    Hedges, Tinnie Gens, PA-C  potassium chloride (K-DUR) 10 MEQ tablet Take 1 tablet (10 mEq total) by mouth 2 (two) times daily. 08/17/17   Rolland Porter, MD    Family History No family history on file.  Social History Social History   Tobacco Use  . Smoking status: Never Smoker  . Smokeless tobacco: Never Used  Substance Use Topics  . Alcohol use: Yes    Comment: occ  . Drug use: No     Allergies   Patient has no known allergies.   Review of Systems Review of Systems  Neurological: Positive for dizziness and headaches. Negative for speech difficulty, weakness, light-headedness and numbness.  All other systems reviewed and are negative.    Physical Exam Updated Vital Signs BP 132/74 (BP Location: Right Arm)   Pulse 84   Temp 98.5 F (36.9 C) (Oral)   Resp 16   Ht 5\' 7"  (1.702 m)   Wt 63.5 kg (140 lb)   LMP 08/27/2017 (Exact Date)   SpO2 99%   BMI 21.93 kg/m   Physical Exam  Constitutional: She is oriented to person, place, and time. She appears well-developed and well-nourished. No distress.  HENT:  Head: Normocephalic and atraumatic.  Mouth/Throat: Oropharynx is clear and moist.  No tenderness of the temporal artery   Eyes: Conjunctivae and EOM are normal. Pupils are equal, round, and reactive to light. No scleral icterus.  No nystagmus   Neck: Normal range of motion. Neck supple.  Full active and passive ROM without pain.  No midline or paraspinal tenderness. No nuchal rigidity or meningeal signs.  Cardiovascular: Normal rate, regular rhythm, normal heart sounds and intact distal pulses.  Pulmonary/Chest: Effort normal and breath sounds normal. No respiratory distress. She has no wheezes. She has no rales.  Abdominal: Soft. Bowel sounds are normal. She exhibits no distension. There is no tenderness. There is no rebound and no guarding.  Musculoskeletal: Normal range of motion.  Lymphadenopathy:    She has no cervical adenopathy.  Neurological: She is alert and  oriented to person, place, and time. She has normal reflexes. No cranial nerve deficit. Coordination normal.  Alert, oriented, thought content appropriate, able to give a coherent history. Speech is clear and goal oriented, able to follow commands.  Cranial Nerves:  II:  Peripheral visual fields grossly normal, pupils equal, round, reactive to light III, IV, VI: EOM intact bilaterally, ptosis not present V,VII: smile symmetric, eyes kept closed tightly against resistance, facial light touch sensation equal VIII: hearing grossly normal IX, X: symmetric soft palate movement, uvula elevates symmetrically  XI: bilateral shoulder shrug symmetric and strong XII: midline tongue extension 5/5 muscle strength in upper and lower extremities bilaterally including strong and equal grip strength and dorsiflexion/plantar flexion Sensory to light touch normal in all four extremities.  Normal finger-to-nose and rapid alternating movements. No drift. Steady gait.  Skin: Skin is warm and dry. No rash noted. She is not diaphoretic.  Nursing note and vitals reviewed.    ED Treatments / Results  Labs (all labs ordered are listed, but only abnormal results are displayed) Labs Reviewed - No data to display  EKG  EKG Interpretation None       Radiology No results found.  Procedures Procedures (including critical care time)  Medications Ordered in ED Medications  ketorolac (TORADOL) 30 MG/ML injection 30 mg (30 mg Intravenous Given 09/20/17 1324)    And  prochlorperazine (COMPAZINE) injection 10 mg (10 mg Intravenous Given 09/20/17 1324)    And  diphenhydrAMINE (BENADRYL) injection 12.5 mg (12.5 mg Intravenous Not Given 09/20/17 1324)    And  sodium chloride 0.9 % bolus 1,000 mL (0 mLs Intravenous Stopped 09/20/17 1435)     Initial Impression / Assessment and Plan / ED Course  I have reviewed the triage vital signs and the nursing notes.  Pertinent labs & imaging results that were available  during my care of the patient were reviewed by me and considered in my medical decision making (see chart for details).     Theresa Winters is a 32 y.o. female who presents to ED for headache x 1 week.  He does endorse intermittent associated dizziness, but has not been dizzy in the last 2 days. No focal neuro deficits on exam.  Migraine cocktail and fluids given.   On re-evaluation, patient feels improved. The patient denies any neurologic symptoms such as visual changes, focal numbness/weakness, balance problems, confusion, or speech difficulty to suggest a life-threatening intracranial process such as intracranial hemorrhage or mass. The patient has no clotting risk factors thus venous sinus thrombosis is unlikely. No fevers, neck pain or nuchal rigidity to suggest meningitis. Upreg pending and patient would like to leave. She understands this is pending. Did have period this month. I  feel that the patient is safe for discharge home at this time. PCP follow up strongly encouraged. I have reviewed return precautions including development of neurologic symptoms, confusion, lethargy, difficulty speaking, or new/worsening/concerning symptoms. All questions answered.   Final Clinical Impressions(s) / ED Diagnoses   Final diagnoses:  Bad headache    ED Discharge Orders    None       Ward, Chase Picket, PA-C 09/20/17 1531    Little, Ambrose Finland, MD 09/20/17 [phone]

## 2017-09-20 NOTE — ED Notes (Signed)
ED Provider at bedside. 

## 2017-09-20 NOTE — ED Triage Notes (Signed)
She left before being seen yesterday and the day the day before after checking in for dizziness for a week and sharp pain in the top of her head.

## 2017-09-25 ENCOUNTER — Encounter (HOSPITAL_BASED_OUTPATIENT_CLINIC_OR_DEPARTMENT_OTHER): Payer: Self-pay | Admitting: *Deleted

## 2017-09-25 ENCOUNTER — Other Ambulatory Visit: Payer: Self-pay

## 2017-09-25 ENCOUNTER — Emergency Department (HOSPITAL_BASED_OUTPATIENT_CLINIC_OR_DEPARTMENT_OTHER): Payer: Medicaid Other

## 2017-09-25 ENCOUNTER — Emergency Department (HOSPITAL_BASED_OUTPATIENT_CLINIC_OR_DEPARTMENT_OTHER)
Admission: EM | Admit: 2017-09-25 | Discharge: 2017-09-25 | Disposition: A | Discharge status: Home / Self Care | Payer: Medicaid Other | Attending: Emergency Medicine | Admitting: Emergency Medicine

## 2017-09-25 DIAGNOSIS — R002 Palpitations: Secondary | ICD-10-CM | POA: Insufficient documentation

## 2017-09-25 DIAGNOSIS — Z79899 Other long term (current) drug therapy: Secondary | ICD-10-CM | POA: Insufficient documentation

## 2017-09-25 DIAGNOSIS — R0602 Shortness of breath: Secondary | ICD-10-CM | POA: Insufficient documentation

## 2017-09-25 LAB — CBC WITH DIFFERENTIAL/PLATELET
Basophils Absolute: 0 10*3/uL (ref 0.0–0.1)
Basophils Relative: 0 %
EOS PCT: 0 %
Eosinophils Absolute: 0 10*3/uL (ref 0.0–0.7)
HCT: 39.1 % (ref 36.0–46.0)
HEMOGLOBIN: 13.3 g/dL (ref 12.0–15.0)
LYMPHS ABS: 2.8 10*3/uL (ref 0.7–4.0)
LYMPHS PCT: 22 %
MCH: 29.5 pg (ref 26.0–34.0)
MCHC: 34 g/dL (ref 30.0–36.0)
MCV: 86.7 fL (ref 78.0–100.0)
Monocytes Absolute: 0.9 10*3/uL (ref 0.1–1.0)
Monocytes Relative: 7 %
NEUTROS PCT: 71 %
Neutro Abs: 8.7 10*3/uL — ABNORMAL HIGH (ref 1.7–7.7)
PLATELETS: 267 10*3/uL (ref 150–400)
RBC: 4.51 MIL/uL (ref 3.87–5.11)
RDW: 14.5 % (ref 11.5–15.5)
WBC: 12.4 10*3/uL — AB (ref 4.0–10.5)

## 2017-09-25 LAB — BASIC METABOLIC PANEL
ANION GAP: 13 (ref 5–15)
BUN: 15 mg/dL (ref 6–20)
CHLORIDE: 107 mmol/L (ref 101–111)
CO2: 19 mmol/L — ABNORMAL LOW (ref 22–32)
Calcium: 9.4 mg/dL (ref 8.9–10.3)
Creatinine, Ser: 1.06 mg/dL — ABNORMAL HIGH (ref 0.44–1.00)
Glucose, Bld: 115 mg/dL — ABNORMAL HIGH (ref 65–99)
POTASSIUM: 3.1 mmol/L — AB (ref 3.5–5.1)
SODIUM: 139 mmol/L (ref 135–145)

## 2017-09-25 LAB — TROPONIN I: Troponin I: <0.03 ng/mL (ref <0.03)

## 2017-09-25 LAB — BRAIN NATRIURETIC PEPTIDE: B Natriuretic Peptide: 30.7 pg/mL (ref 0.0–100.0)

## 2017-09-25 MED ORDER — POTASSIUM CHLORIDE CRYS ER 20 MEQ PO TBCR
40.0000 meq | EXTENDED_RELEASE_TABLET | Freq: Once | ORAL | Status: AC
  Administered 2017-09-25: 40 meq via ORAL
  Filled 2017-09-25: qty 2

## 2017-09-25 MED ORDER — DIPHENHYDRAMINE HCL 25 MG PO CAPS
25.0000 mg | ORAL_CAPSULE | Freq: Once | ORAL | Status: AC
  Administered 2017-09-25: 25 mg via ORAL
  Filled 2017-09-25: qty 1

## 2017-09-25 MED ORDER — PREDNISONE 50 MG PO TABS
60.0000 mg | ORAL_TABLET | Freq: Once | ORAL | Status: AC
  Administered 2017-09-25: 60 mg via ORAL
  Filled 2017-09-25: qty 1

## 2017-09-25 NOTE — Discharge Instructions (Signed)
You have been evaluated for your heart palpitation.  It is important to follow up with your primary care doctor next week for further management.  Your thyroid test have not resulted yet, your doctor will need to follow up on that.  Return if you have any concerns.  Your potassium level is low today, please eat a banana a day for 1 week.

## 2017-09-25 NOTE — ED Provider Notes (Signed)
[facility] EMERGENCY DEPARTMENT Provider Note   CSN: [id number] Arrival date & time: 09/25/17  2014     History   Chief Complaint Chief Complaint  Patient presents with  . Palpitations    HPI Theresa Winters is a 32 y.o. female.  HPI  32 year old female presenting for evaluation of heart palpitation.  Patient report for the past 2-3 weeks she has been experiencing intermittent bouts of lightheadedness and dizziness.  Symptoms usually worsen with positional change and improves when she lays down.  She endorsed generalized weakness and fatigue.  She also complaining of having increased shortness of breath.  Having headache, and heart palpitation.  She has been noticing that her heart rate has been very fast when she looks at her smart watch.  She was seen at an outside hospital at [facility] yesterday for her complaint.  States that she had a workup including blood work, EKG, and a chest CT angiogram with was told that nothing is abnormal.  He was discharged home with Benadryl.  Today she noticed swelling to her hands and feet and still noticed fast heart rate and heart palpitation for which concerns her.  She denies any history of thyroid disease, no prior history of PE or DVT, no recent surgery, prolonged bed rest.  Denies any significant family history of cardiac disease.  Denies increasing stress.  No prior history of exertional syncope.  Denies drinking caffeine, or taking any kind of stimulants over-the-counter medication.   Past Medical History:  Diagnosis Date  . Scoliosis   . Scoliosis     There are no active problems to display for this patient.   Past Surgical History:  Procedure Laterality Date  . THERAPEUTIC ABORTION      OB History    Gravida Para Term Preterm AB Living   1             SAB TAB Ectopic Multiple Live Births                   Home Medications    Prior to Admission medications   Medication Sig Start Date End Date Taking?  Authorizing Provider  azithromycin (ZITHROMAX) 1 g powder Take 1 g by mouth once.    [provider]  ibuprofen (ADVIL,MOTRIN) 800 MG tablet Take 1 tablet (800 mg total) by mouth every 8 (eight) hours as needed. 05/26/17   Long, Arlyss Repress, MD  metoprolol tartrate (LOPRESSOR) 25 MG tablet Take 1 tablet (25 mg total) by mouth at bedtime as needed (Palpitations). 08/17/17   Rolland Porter, MD  metroNIDAZOLE (FLAGYL) 500 MG tablet Take 1 tablet (500 mg total) by mouth 2 (two) times daily. 02/13/16   Hedges, Tinnie Gens, PA-C  potassium chloride (K-DUR) 10 MEQ tablet Take 1 tablet (10 mEq total) by mouth 2 (two) times daily. 08/17/17   Rolland Porter, MD    Family History No family history on file.  Social History Social History   Tobacco Use  . Smoking status: Never Smoker  . Smokeless tobacco: Never Used  Substance Use Topics  . Alcohol use: Yes    Comment: occ  . Drug use: No     Allergies   Patient has no known allergies.   Review of Systems Review of Systems  All other systems reviewed and are negative.    Physical Exam Updated Vital Signs BP 133/73   Pulse (!) 117   Temp 98.6 F (37 C) (Oral)   Resp 19   Ht  5\' 7"  (1.702 m)   Wt 63.5 kg (140 lb)   LMP 09/23/2017   SpO2 100%   BMI 21.93 kg/m   Physical Exam  Constitutional: She is oriented to person, place, and time. She appears well-developed and well-nourished. No distress.  HENT:  Head: Atraumatic.  Mouth/Throat: Oropharynx is clear and moist.  Eyes: Conjunctivae are normal.  Neck: Neck supple. No thyromegaly present.  Cardiovascular: Intact distal pulses.  Tachycardia without murmur rubs or gallops  Pulmonary/Chest: Effort normal and breath sounds normal. No respiratory distress. She has no wheezes. She has no rales.  Abdominal: Soft. She exhibits no distension. There is no tenderness.  Musculoskeletal: She exhibits no edema.  Neurological: She is alert and oriented to person, place, and time.  Skin: Skin  is warm. No rash noted.  Psychiatric: She has a normal mood and affect.  Nursing note and vitals reviewed.    ED Treatments / Results  Labs (all labs ordered are listed, but only abnormal results are displayed) Labs Reviewed  BASIC METABOLIC PANEL - Abnormal; Notable for the following components:      Result Value   Potassium 3.1 (*)    CO2 19 (*)    Glucose, Bld 115 (*)    Creatinine, Ser 1.06 (*)    All other components within normal limits  CBC WITH DIFFERENTIAL/PLATELET - Abnormal; Notable for the following components:   WBC 12.4 (*)    Neutro Abs 8.7 (*)    All other components within normal limits  BRAIN NATRIURETIC PEPTIDE  TROPONIN I  TSH  T4, FREE    EKG  EKG Interpretation  Date/Time:  Saturday September 25 2017 20:22:18 EST Ventricular Rate:  127 PR Interval:  144 QRS Duration: 94 QT Interval:  300 QTC Calculation: 436 R Axis:   76 Text Interpretation:  Sinus tachycardia Possible Left atrial enlargement RSR' or QR pattern in V1 suggests right ventricular conduction delay Cannot rule out Anterior infarct , age undetermined Abnormal ECG SINCE LAST TRACING HEART RATE HAS INCREASED Confirmed by Rolan BuccoBelfi, Melanie [phone]([id number]) on 09/25/2017 10:19:48 PM       Radiology Dg Chest 2 View  Result Date: 09/25/2017 CLINICAL DATA:  Allergic reaction yesterday, seen at [facility]. Bilateral upper and lower extremity swelling. Increased heart rate today. Shortness of breath. Previous smoker. EXAM: CHEST  2 VIEW COMPARISON:  CT chest and chest radiographs 09/24/2017 FINDINGS: Normal heart size and pulmonary vascularity. No focal airspace disease or consolidation in the lungs. No blunting of costophrenic angles. No pneumothorax. Mediastinal contours appear intact. Thoracic scoliosis convex towards the right with lumbar scoliosis convex towards the left. IMPRESSION: No active cardiopulmonary disease. Electronically Signed   By: Burman NievesWilliam  Stevens M.D.   On: 09/25/2017 22:52      Procedures Procedures (including critical care time)  Medications Ordered in ED Medications  potassium chloride SA (K-DUR,KLOR-CON) CR tablet 40 mEq (not administered)     Initial Impression / Assessment and Plan / ED Course  I have reviewed the triage vital signs and the nursing notes.  Pertinent labs & imaging results that were available during my care of the patient were reviewed by me and considered in my medical decision making (see chart for details).     BP 104/66   Pulse 71   Temp 98.6 F (37 C) (Oral)   Resp 14   Ht 5\' 7"  (1.702 m)   Wt 63.5 kg (140 lb)   LMP 09/23/2017   SpO2 100%   BMI  21.93 kg/m    Final Clinical Impressions(s) / ED Diagnoses   Final diagnoses:  Heart palpitations    ED Discharge Orders    None     10:18 PM Patient here with complaints of heart palpitation, lightheadedness, and having chest pain, swelling to hands and feet.  She was seen at an outside hospital for her complaint yesterday and had a negative workup including negative chest CT angiogram.  No evidence of PE.  11:20 PM Heart rate normalized with IV fluid.  Mild hypokalemia with a potassium of 3.1, supplementation given.  Mild elevated white blood cells of 12.4, likely stress demargination.  BNP is normal, chest x-ray unremarkable, EKG without concerning ischemic changes.  No evidence of atrial fibrillation.  11:26 PM Encourage patient to follow-up with her primary care doctor on Tuesday as previously scheduled.  It is 3 days from now.  She can follow-up with her thyroid test as it has not resulted yet.  While in the ER, patient developed an mild erythematous rash to her face and complaining of swelling to her hands again.  It does not appear the patient is having an anaphylactic reaction however, a course of steroid and Benadryl was given.  She mentioned she had the same rash several days prior.  Otherwise patient is stable for discharge.  Return precautions discussed.    Fayrene Helper, PA-C 09/25/17 1610    Rolan Bucco, MD 09/26/17 Marlyne Beards

## 2017-09-25 NOTE — ED Triage Notes (Signed)
Pt reports "I can't berathe". Pulse Ox rechecked. HR 160. Pt c/o chest pressure at his time. Taken to room 14 and charge nurse notified

## 2017-09-25 NOTE — ED Triage Notes (Signed)
Pt reports 2-3 weeks of dizziness and headaches. [facility]. HR 140 in registration. Pt reports feeling SOB and having palpitations. [facility]

## 2017-09-25 NOTE — ED Notes (Signed)
Pt now stating the [facility] of [facility] reaction. Pt has been having a red macular rash all over body and hand swelling. [facility] to [facility] benad[facility] and steroids. Pt states she has been taking the benadryl with some relief, but states she did not want to take the steroids because she "doesn't like medicine."

## 2017-09-27 LAB — TSH: TSH: 3.957 u[IU]/mL (ref 0.350–4.500)

## 2017-09-27 LAB — T4, FREE: FREE T4: 1.22 ng/dL — AB (ref 0.61–1.12)

## 2017-12-28 ENCOUNTER — Other Ambulatory Visit: Payer: Self-pay

## 2017-12-28 ENCOUNTER — Emergency Department (HOSPITAL_BASED_OUTPATIENT_CLINIC_OR_DEPARTMENT_OTHER)
Admission: EM | Admit: 2017-12-28 | Discharge: 2017-12-28 | Disposition: A | Discharge status: Home / Self Care | Payer: Medicaid Other | Attending: Emergency Medicine | Admitting: Emergency Medicine

## 2017-12-28 ENCOUNTER — Emergency Department (HOSPITAL_BASED_OUTPATIENT_CLINIC_OR_DEPARTMENT_OTHER): Payer: Medicaid Other

## 2017-12-28 ENCOUNTER — Encounter (HOSPITAL_BASED_OUTPATIENT_CLINIC_OR_DEPARTMENT_OTHER): Payer: Self-pay | Admitting: *Deleted

## 2017-12-28 DIAGNOSIS — Z5321 Procedure and treatment not carried out due to patient leaving prior to being seen by health care provider: Secondary | ICD-10-CM | POA: Diagnosis not present

## 2017-12-28 DIAGNOSIS — R079 Chest pain, unspecified: Secondary | ICD-10-CM | POA: Diagnosis not present

## 2017-12-28 NOTE — ED Triage Notes (Signed)
Sob and pain in her chest. States she had a normal check up here a few months ago when she had the same symptoms. No distress.

## 2017-12-28 NOTE — ED Notes (Signed)
Pt reports ongoing CP, SOB x several weeks. States that it is worse with walking. Reports that she has been seen and treated for this without dx. States that she has an appointment in 3 days with pulmonology and next week with cardiology. No acute distress noted.

## 2017-12-28 NOTE — ED Notes (Signed)
Pt not found in room.

## 2018-01-06 DIAGNOSIS — F410 Panic disorder [episodic paroxysmal anxiety] without agoraphobia: Secondary | ICD-10-CM | POA: Insufficient documentation

## 2018-01-06 DIAGNOSIS — R002 Palpitations: Secondary | ICD-10-CM | POA: Insufficient documentation

## 2018-02-28 DIAGNOSIS — Z3490 Encounter for supervision of normal pregnancy, unspecified, unspecified trimester: Secondary | ICD-10-CM | POA: Insufficient documentation

## 2018-03-22 ENCOUNTER — Encounter (HOSPITAL_BASED_OUTPATIENT_CLINIC_OR_DEPARTMENT_OTHER): Payer: Self-pay | Admitting: Emergency Medicine

## 2018-03-22 ENCOUNTER — Encounter (HOSPITAL_BASED_OUTPATIENT_CLINIC_OR_DEPARTMENT_OTHER): Payer: Self-pay

## 2018-03-22 ENCOUNTER — Other Ambulatory Visit: Payer: Self-pay

## 2018-03-22 ENCOUNTER — Emergency Department (HOSPITAL_BASED_OUTPATIENT_CLINIC_OR_DEPARTMENT_OTHER)
Admission: EM | Admit: 2018-03-22 | Discharge: 2018-03-22 | Disposition: A | Discharge status: Home / Self Care | Payer: Medicaid Other | Attending: Emergency Medicine | Admitting: Emergency Medicine

## 2018-03-22 ENCOUNTER — Ambulatory Visit (HOSPITAL_BASED_OUTPATIENT_CLINIC_OR_DEPARTMENT_OTHER)
Admission: RE | Admit: 2018-03-22 | Discharge: 2018-03-22 | Disposition: A | Discharge status: Home / Self Care | Payer: Medicaid Other | Source: Ambulatory Visit | Attending: Emergency Medicine | Admitting: Emergency Medicine

## 2018-03-22 DIAGNOSIS — O9989 Other specified diseases and conditions complicating pregnancy, childbirth and the puerperium: Secondary | ICD-10-CM | POA: Diagnosis not present

## 2018-03-22 DIAGNOSIS — Z3A11 11 weeks gestation of pregnancy: Secondary | ICD-10-CM | POA: Diagnosis not present

## 2018-03-22 DIAGNOSIS — O26891 Other specified pregnancy related conditions, first trimester: Secondary | ICD-10-CM | POA: Diagnosis present

## 2018-03-22 DIAGNOSIS — R109 Unspecified abdominal pain: Secondary | ICD-10-CM | POA: Insufficient documentation

## 2018-03-22 DIAGNOSIS — Z3A10 10 weeks gestation of pregnancy: Secondary | ICD-10-CM | POA: Insufficient documentation

## 2018-03-22 LAB — HCG, QUANTITATIVE, PREGNANCY: hCG, Beta Chain, Quant, S: 130238 m[IU]/mL — ABNORMAL HIGH (ref <5)

## 2018-03-22 MED ORDER — SODIUM CHLORIDE 0.9 % IV BOLUS
1000.0000 mL | Freq: Once | INTRAVENOUS | Status: AC
  Administered 2018-03-22: 1000 mL via INTRAVENOUS

## 2018-03-22 NOTE — ED Triage Notes (Signed)
Patient presents with co lower abd cramping onset 1900; states went to sleep but pain woke her back up; also having hyperemesis states last emesis was just pta. Denies any vag bleeding; states 10 weeks preg; states started macrobid today for UTI.

## 2018-03-22 NOTE — ED Provider Notes (Signed)
[facility] Provider Note: Theresa Dell, MD, FACEP  CSN: [id number] MRN: [id number] ARRIVAL: 03/22/18 at 0045 ROOM: [facility]04/[facility]04   CHIEF COMPLAINT  Abdominal Pain   HISTORY OF PRESENT ILLNESS  03/22/18 2:09 AM Theresa Winters is a 32 y.o. female who is about [redacted] weeks pregnant.  She started Macrobid yesterday for urinary tract infection.  She is here with lower and left lower abdominal cramping which began about 7 PM yesterday evening.  She is also been having hyperemesis and states her last episode of vomiting was just prior to arrival.  She denies vaginal bleeding or discharge.  She feels like she is dehydrated as her urinary output is decreased in her mouth is dry.  She is documented blood type B+.   Past Medical History:  Diagnosis Date  . Scoliosis   . Scoliosis     Past Surgical History:  Procedure Laterality Date  . THERAPEUTIC ABORTION      No family history on file.  Social History   Tobacco Use  . Smoking status: Never Smoker  . Smokeless tobacco: Never Used  Substance Use Topics  . Alcohol use: Yes    Comment: occ  . Drug use: No    Prior to Admission medications   Medication Sig Start Date End Date Taking? Authorizing Provider  DOXYLAMINE-PYRIDOXINE PO Take by mouth.   Yes [provider]  nitrofurantoin (MACRODANTIN) 100 MG capsule Take 100 mg by mouth 2 (two) times daily.   Yes [provider]  metoprolol tartrate (LOPRESSOR) 25 MG tablet Take 1 tablet (25 mg total) by mouth at bedtime as needed (Palpitations). 08/17/17   Rolland Porter, MD    Allergies Patient has no known allergies.   REVIEW OF SYSTEMS  Negative except as noted here or in the History of Present Illness.   PHYSICAL EXAMINATION  Initial Vital Signs Blood pressure 119/89, pulse 84, temperature 97.7 F (36.5 C), temperature source Oral, resp. rate 16, height 5\' 7"  (1.702 m), weight 59.9 kg (132 lb), last menstrual period 01/07/2018, SpO2 100  %.  Examination General: Well-developed, well-nourished female in no acute distress; appearance consistent with age of record HENT: normocephalic; atraumatic; dry mucous membranes Eyes: pupils equal, round and reactive to light; extraocular muscles intact Neck: supple Heart: regular rate and rhythm Lungs: clear to auscultation bilaterally Abdomen: soft; nondistended; nontender; gravid, consistent with dates; bowel sounds present Extremities: No deformity; full range of motion; pulses normal Neurologic: Awake, alert and oriented; motor function intact in all extremities and symmetric; no facial droop Skin: Warm and dry Psychiatric: Normal mood and affect   RESULTS  Summary of this visit's results, reviewed by myself:   EKG Interpretation  Date/Time:    Ventricular Rate:    PR Interval:    QRS Duration:   QT Interval:    QTC Calculation:   R Axis:     Text Interpretation:        Laboratory Studies: Results for orders placed or performed during the hospital encounter of 03/22/18 (from the past 24 hour(s))  hCG, quantitative, pregnancy     Status: Abnormal   Collection Time: 03/22/18  2:25 AM  Result Value Ref Range   hCG, Beta Chain, Quant, S 130,238 (H) <5 mIU/mL   Imaging Studies: No results found.  ED COURSE and MDM  Nursing notes and initial vitals signs, including pulse oximetry, reviewed.  Vitals:   03/22/18 0058 03/22/18 0100  BP: 119/89   Pulse: 84   Resp: 16  Temp: 97.7 F (36.5 C)   TempSrc: Oral   SpO2: 100%   Weight:  59.9 kg (132 lb)  Height:  5\' 7"  (1.702 m)   3:24 AM Feels better after IV fluid bolus.  Nausea and cramping have resolved.  We will have her return later for transvaginal ultrasound.  PROCEDURES    ED DIAGNOSES     ICD-10-CM   1. Abdominal pain in pregnancy, first trimester O26.891    R10.9        Elif Yonts, Jonny RuizJohn, MD 03/22/18 [id number]

## 2018-06-28 DIAGNOSIS — O99019 Anemia complicating pregnancy, unspecified trimester: Secondary | ICD-10-CM | POA: Insufficient documentation

## 2018-08-22 DIAGNOSIS — M419 Scoliosis, unspecified: Secondary | ICD-10-CM | POA: Insufficient documentation

## 2018-08-24 NOTE — L&D Delivery Note (Addendum)
Delivery Note At 7:20 PM a viable female (named Theresa Winters) was delivered via Vaginal, Spontaneous (Presentation:OA to ROA).  APGAR: 7, 9; weight 7 lb 11.3 oz (3496 g).   Placenta status:complete.  Cord: 3V with the following complications: tight nuchal, somersaulted past.  Cord pH: n/a  Anesthesia:  epidural Episiotomy: None Lacerations: None Suture Repair: n/a Est. Blood Loss (mL): 50  Mom to postpartum.  Baby to Couplet care / Skin to Skin.  Jonetta Speak 10/08/2018, 7:42 AM

## 2018-09-08 LAB — OB RESULTS CONSOLE RUBELLA ANTIBODY, IGM: Rubella: IMMUNE

## 2018-09-08 LAB — OB RESULTS CONSOLE HIV ANTIBODY (ROUTINE TESTING): HIV: NONREACTIVE

## 2018-09-08 LAB — OB RESULTS CONSOLE GC/CHLAMYDIA
Chlamydia: NEGATIVE
Gonorrhea: NEGATIVE

## 2018-09-08 LAB — OB RESULTS CONSOLE HEPATITIS B SURFACE ANTIGEN: HEP B S AG: NEGATIVE

## 2018-09-08 LAB — OB RESULTS CONSOLE RPR: RPR: NONREACTIVE

## 2018-09-15 LAB — OB RESULTS CONSOLE GBS: GBS: NEGATIVE

## 2018-09-30 ENCOUNTER — Encounter (HOSPITAL_COMMUNITY): Payer: Self-pay

## 2018-09-30 ENCOUNTER — Inpatient Hospital Stay (HOSPITAL_COMMUNITY)
Admission: AD | Admit: 2018-09-30 | Discharge: 2018-09-30 | Disposition: A | Discharge status: Home / Self Care | Payer: Medicaid Other | Attending: Obstetrics & Gynecology | Admitting: Obstetrics & Gynecology

## 2018-09-30 DIAGNOSIS — Z3A38 38 weeks gestation of pregnancy: Secondary | ICD-10-CM | POA: Diagnosis not present

## 2018-09-30 DIAGNOSIS — O471 False labor at or after 37 completed weeks of gestation: Secondary | ICD-10-CM | POA: Insufficient documentation

## 2018-09-30 DIAGNOSIS — O479 False labor, unspecified: Secondary | ICD-10-CM

## 2018-09-30 NOTE — MAU Provider Note (Signed)
  S: [name] is a 33 y.o. G3P1011 at [redacted]w[redacted]d  who presents to MAU today complaining contractions q 15 minutes. She denies vaginal bleeding. She denies LOF. She reports normal fetal movement.    O: BP 121/80   Pulse 86   Temp 98.2 F (36.8 C)   Resp 19   Ht 5\' 7"  (1.702 m)   Wt 80.7 kg   LMP 01/07/2018 (Exact Date)   SpO2 99%   BMI 27.88 kg/m   Cervical exam:  Dilation: 3 Effacement (%): 50 Cervical Position: Posterior Station: -2 Presentation: Vertex Exam by:: Roxan Hockey RN   Fetal Monitoring: Baseline: 140 Variability: average Accelerations: present Decelerations: absent Contractions: occasional   A: SIUP at [redacted]w[redacted]d  False labor  Nonstress test is reactive, category I  P: Discharge home per RN labor check Followup in office as scheduled Labor precautions  Aviva Signs, CNM 09/30/2018 6:07 AM

## 2018-09-30 NOTE — MAU Note (Signed)
CTX every 10 mins for the past hour and a half.  No LOF/VB.  + FM.  No complications w/ the pregnancy.

## 2018-09-30 NOTE — MAU Note (Signed)
I have communicated with  Wynelle Bourgeois and reviewed vital signs:  Vitals:   09/30/18 0438 09/30/18 0608  BP: 121/80 101/72  Pulse: 86   Resp: 19   Temp: 98.2 F (36.8 C)   SpO2: 99%     Vaginal exam:  Dilation: 3 Effacement (%): 50 Cervical Position: Posterior Station: -2 Presentation: Vertex Exam by:: Roxan Hockey RN,   Also reviewed contraction pattern and that non-stress test is reactive.  It has been documented that patient is contracting every irregularly minutes with no cervical change over one hour not indicating active labor.  Patient denies any other complaints.  Based on this report provider has given order for discharge.  A discharge order and diagnosis entered by a provider.   Labor discharge instructions reviewed with patient.

## 2018-09-30 NOTE — Discharge Instructions (Signed)

## 2018-10-03 ENCOUNTER — Inpatient Hospital Stay (HOSPITAL_COMMUNITY)
Admission: AD | Admit: 2018-10-03 | Discharge: 2018-10-03 | Disposition: A | Discharge status: Home / Self Care | Payer: Medicaid Other | Attending: Obstetrics and Gynecology | Admitting: Obstetrics and Gynecology

## 2018-10-03 ENCOUNTER — Encounter (HOSPITAL_COMMUNITY): Payer: Self-pay | Admitting: *Deleted

## 2018-10-03 DIAGNOSIS — O479 False labor, unspecified: Secondary | ICD-10-CM

## 2018-10-03 DIAGNOSIS — Z3689 Encounter for other specified antenatal screening: Secondary | ICD-10-CM

## 2018-10-03 DIAGNOSIS — Z3A38 38 weeks gestation of pregnancy: Secondary | ICD-10-CM | POA: Diagnosis not present

## 2018-10-03 DIAGNOSIS — O471 False labor at or after 37 completed weeks of gestation: Secondary | ICD-10-CM | POA: Diagnosis not present

## 2018-10-03 LAB — POCT FERN TEST: POCT Fern Test: NEGATIVE

## 2018-10-03 NOTE — MAU Note (Signed)
PT SAYS UC STRONG.  SAYS HER PERINEUM FEELS NUMB.  PNC WITH  [facility] -  IN OFFICE -  TODAY- 4 CM .   DENIES HSV AND MRSA. GBS- NEG

## 2018-10-03 NOTE — Progress Notes (Signed)
I have communicated with Clayton Bibles, cnm and reviewed vital signs:  Vitals:   10/03/18 2133 10/03/18 2318  BP: 121/74 121/72  Pulse: 91 99  Resp: 18 17  Temp: 97.7 F (36.5 C) 98.3 F (36.8 C)    Vaginal exam:  Dilation: 3 Effacement (%): 50 Station: -3 Presentation: Vertex Exam by:: Karl Ito, rnc ,   Also reviewed contraction pattern and that non-stress test is reactive.  It has been documented that patient is contracting every  3-52minutes with no cervical change over an hour not indicating active labor.  Patient complain of feeling wet after cervical exam. Crist Fat negative, Sam W., notified. Provider also reviewed strip.  Based on this report provider has given order for discharge.  A discharge order and diagnosis entered by a provider.   Labor discharge instructions reviewed with patient.

## 2018-10-03 NOTE — MAU Provider Note (Signed)
None      S: [name] is a 33 y.o. G3P1011 at [redacted]w[redacted]d  who presents to MAU today complaining contractions q five minutes throughout the day today. She denies vaginal bleeding. She denies LOF. She reports normal fetal movement.    O: BP 121/72   Pulse 99   Temp 98.3 F (36.8 C) (Oral)   Resp 17   Ht 5\' 7"  (1.702 m)   Wt 81.3 kg   LMP 01/07/2018 (Exact Date)   BMI 28.07 kg/m  GENERAL: Well-developed, well-nourished female in no acute distress.  HEAD: Normocephalic, atraumatic.  CHEST: Normal effort of breathing, regular heart rate ABDOMEN: Soft, nontender, gravid  Cervical exam:  Dilation: 3 Effacement (%): 50 Station: -3 Presentation: Vertex Exam by:: Karl Ito, rnc    Fetal Monitoring: Baseline: 140 Variability: moderate Accelerations: positive 15 x 15 Decelerations: none Contractions: irregular q 2-5 min   A: SIUP at [redacted]w[redacted]d  False labor  Normotensive Cervix unchanged from clinic  P: Discharge home in stable condition with labor precautions  Calvert Cantor, CNM 10/03/2018 11:34 PM

## 2018-10-03 NOTE — Discharge Instructions (Signed)

## 2018-10-04 DIAGNOSIS — O4693 Antepartum hemorrhage, unspecified, third trimester: Secondary | ICD-10-CM | POA: Insufficient documentation

## 2018-10-07 ENCOUNTER — Encounter (HOSPITAL_COMMUNITY): Payer: Self-pay | Admitting: *Deleted

## 2018-10-07 ENCOUNTER — Other Ambulatory Visit: Payer: Self-pay

## 2018-10-07 ENCOUNTER — Inpatient Hospital Stay (HOSPITAL_COMMUNITY): Payer: Medicaid Other | Admitting: Anesthesiology

## 2018-10-07 ENCOUNTER — Inpatient Hospital Stay (HOSPITAL_COMMUNITY)
Admission: AD | Admit: 2018-10-07 | Discharge: 2018-10-09 | DRG: 807 | Disposition: A | Discharge status: Home / Self Care | Payer: Medicaid Other | Attending: Obstetrics & Gynecology | Admitting: Obstetrics & Gynecology

## 2018-10-07 DIAGNOSIS — Z3A39 39 weeks gestation of pregnancy: Secondary | ICD-10-CM

## 2018-10-07 DIAGNOSIS — M419 Scoliosis, unspecified: Secondary | ICD-10-CM | POA: Diagnosis present

## 2018-10-07 DIAGNOSIS — O4290 Premature rupture of membranes, unspecified as to length of time between rupture and onset of labor, unspecified weeks of gestation: Secondary | ICD-10-CM | POA: Diagnosis present

## 2018-10-07 DIAGNOSIS — O4292 Full-term premature rupture of membranes, unspecified as to length of time between rupture and onset of labor: Principal | ICD-10-CM | POA: Diagnosis present

## 2018-10-07 HISTORY — DX: Other specified health status: Z78.9

## 2018-10-07 LAB — CBC
HCT: 35.2 % — ABNORMAL LOW (ref 36.0–46.0)
Hemoglobin: 11.4 g/dL — ABNORMAL LOW (ref 12.0–15.0)
MCH: 28.2 pg (ref 26.0–34.0)
MCHC: 32.4 g/dL (ref 30.0–36.0)
MCV: 87.1 fL (ref 80.0–100.0)
Platelets: 163 10*3/uL (ref 150–400)
RBC: 4.04 MIL/uL (ref 3.87–5.11)
RDW: 15 % (ref 11.5–15.5)
WBC: 12 10*3/uL — ABNORMAL HIGH (ref 4.0–10.5)
nRBC: 0 % (ref 0.0–0.2)

## 2018-10-07 LAB — TYPE AND SCREEN
ABO/RH(D): B POS
Antibody Screen: NEGATIVE

## 2018-10-07 LAB — POCT FERN TEST: POCT FERN TEST: NEGATIVE

## 2018-10-07 LAB — AMNISURE RUPTURE OF MEMBRANE (ROM) NOT AT ARMC: Amnisure ROM: POSITIVE

## 2018-10-07 LAB — ABO/RH: ABO/RH(D): B POS

## 2018-10-07 MED ORDER — WITCH HAZEL-GLYCERIN EX PADS: 1.0000 "application " | MEDICATED_PAD | CUTANEOUS | Status: DC | PRN

## 2018-10-07 MED ORDER — DIBUCAINE 1 % RE OINT: 1.0000 "application " | TOPICAL_OINTMENT | RECTAL | Status: DC | PRN

## 2018-10-07 MED ORDER — LIDOCAINE HCL (PF) 1 % IJ SOLN
INTRAMUSCULAR | Status: DC | PRN
  Administered 2018-10-07: 13 mL via EPIDURAL

## 2018-10-07 MED ORDER — IBUPROFEN 600 MG PO TABS
600.0000 mg | ORAL_TABLET | Freq: Four times a day (QID) | ORAL | Status: DC
  Administered 2018-10-07 – 2018-10-09 (×6): 600 mg via ORAL
  Filled 2018-10-07 (×7): qty 1

## 2018-10-07 MED ORDER — ONDANSETRON HCL 4 MG/2ML IJ SOLN: 4.0000 mg | Freq: Four times a day (QID) | INTRAMUSCULAR | Status: DC | PRN

## 2018-10-07 MED ORDER — SOD CITRATE-CITRIC ACID 500-334 MG/5ML PO SOLN: 30.0000 mL | ORAL | Status: DC | PRN

## 2018-10-07 MED ORDER — COCONUT OIL OIL: 1.0000 "application " | TOPICAL_OIL | Status: DC | PRN

## 2018-10-07 MED ORDER — OXYTOCIN 40 UNITS IN NORMAL SALINE INFUSION - SIMPLE MED
1.0000 m[IU]/min | INTRAVENOUS | Status: DC
  Administered 2018-10-07: 2 m[IU]/min via INTRAVENOUS
  Filled 2018-10-07: qty 1000

## 2018-10-07 MED ORDER — PHENYLEPHRINE 40 MCG/ML (10ML) SYRINGE FOR IV PUSH (FOR BLOOD PRESSURE SUPPORT)
80.0000 ug | PREFILLED_SYRINGE | INTRAVENOUS | Status: DC | PRN
  Filled 2018-10-07: qty 10

## 2018-10-07 MED ORDER — ACETAMINOPHEN 325 MG PO TABS: 650.0000 mg | ORAL_TABLET | ORAL | Status: DC | PRN

## 2018-10-07 MED ORDER — EPHEDRINE 5 MG/ML INJ
10.0000 mg | INTRAVENOUS | Status: DC | PRN
  Filled 2018-10-07: qty 2

## 2018-10-07 MED ORDER — ZOLPIDEM TARTRATE 5 MG PO TABS: 5.0000 mg | ORAL_TABLET | Freq: Every evening | ORAL | Status: DC | PRN

## 2018-10-07 MED ORDER — TERBUTALINE SULFATE 1 MG/ML IJ SOLN
0.2500 mg | Freq: Once | INTRAMUSCULAR | Status: DC | PRN
  Filled 2018-10-07: qty 1

## 2018-10-07 MED ORDER — ACETAMINOPHEN 325 MG PO TABS
650.0000 mg | ORAL_TABLET | ORAL | Status: DC | PRN
  Administered 2018-10-08: 650 mg via ORAL
  Filled 2018-10-07: qty 2

## 2018-10-07 MED ORDER — DIPHENHYDRAMINE HCL 25 MG PO CAPS: 25.0000 mg | ORAL_CAPSULE | Freq: Four times a day (QID) | ORAL | Status: DC | PRN

## 2018-10-07 MED ORDER — FENTANYL 2.5 MCG/ML BUPIVACAINE 1/10 % EPIDURAL INFUSION (WH - ANES)
14.0000 mL/h | INTRAMUSCULAR | Status: DC | PRN
  Administered 2018-10-07 (×2): 14 mL/h via EPIDURAL
  Filled 2018-10-07 (×2): qty 100

## 2018-10-07 MED ORDER — OXYTOCIN BOLUS FROM INFUSION
500.0000 mL | Freq: Once | INTRAVENOUS | Status: AC
  Administered 2018-10-07: 500 mL via INTRAVENOUS

## 2018-10-07 MED ORDER — LACTATED RINGERS IV SOLN: 500.0000 mL | INTRAVENOUS | Status: DC | PRN

## 2018-10-07 MED ORDER — LACTATED RINGERS IV SOLN
INTRAVENOUS | Status: DC
  Administered 2018-10-07 (×3): via INTRAVENOUS

## 2018-10-07 MED ORDER — LIDOCAINE HCL (PF) 1 % IJ SOLN
30.0000 mL | INTRAMUSCULAR | Status: DC | PRN
  Filled 2018-10-07: qty 30

## 2018-10-07 MED ORDER — OXYCODONE-ACETAMINOPHEN 5-325 MG PO TABS: 2.0000 | ORAL_TABLET | ORAL | Status: DC | PRN

## 2018-10-07 MED ORDER — ONDANSETRON HCL 4 MG PO TABS: 4.0000 mg | ORAL_TABLET | ORAL | Status: DC | PRN

## 2018-10-07 MED ORDER — TETANUS-DIPHTH-ACELL PERTUSSIS 5-2.5-18.5 LF-MCG/0.5 IM SUSP: 0.5000 mL | Freq: Once | INTRAMUSCULAR | Status: DC

## 2018-10-07 MED ORDER — BENZOCAINE-MENTHOL 20-0.5 % EX AERO: 1.0000 "application " | INHALATION_SPRAY | CUTANEOUS | Status: DC | PRN

## 2018-10-07 MED ORDER — PHENYLEPHRINE 40 MCG/ML (10ML) SYRINGE FOR IV PUSH (FOR BLOOD PRESSURE SUPPORT)
80.0000 ug | PREFILLED_SYRINGE | INTRAVENOUS | Status: DC | PRN
  Filled 2018-10-07 (×2): qty 10

## 2018-10-07 MED ORDER — DIPHENHYDRAMINE HCL 50 MG/ML IJ SOLN: 12.5000 mg | INTRAMUSCULAR | Status: DC | PRN

## 2018-10-07 MED ORDER — SENNOSIDES-DOCUSATE SODIUM 8.6-50 MG PO TABS
2.0000 | ORAL_TABLET | ORAL | Status: DC
  Administered 2018-10-07 – 2018-10-08 (×2): 2 via ORAL
  Filled 2018-10-07 (×2): qty 2

## 2018-10-07 MED ORDER — LACTATED RINGERS IV SOLN: 500.0000 mL | Freq: Once | INTRAVENOUS | Status: DC

## 2018-10-07 MED ORDER — OXYTOCIN 40 UNITS IN NORMAL SALINE INFUSION - SIMPLE MED: 2.5000 [IU]/h | INTRAVENOUS | Status: DC

## 2018-10-07 MED ORDER — OXYCODONE-ACETAMINOPHEN 5-325 MG PO TABS: 1.0000 | ORAL_TABLET | ORAL | Status: DC | PRN

## 2018-10-07 MED ORDER — ONDANSETRON HCL 4 MG/2ML IJ SOLN: 4.0000 mg | INTRAMUSCULAR | Status: DC | PRN

## 2018-10-07 MED ORDER — SIMETHICONE 80 MG PO CHEW: 80.0000 mg | CHEWABLE_TABLET | ORAL | Status: DC | PRN

## 2018-10-07 MED ORDER — PRENATAL MULTIVITAMIN CH
1.0000 | ORAL_TABLET | Freq: Every day | ORAL | Status: DC
  Administered 2018-10-08 – 2018-10-09 (×2): 1 via ORAL
  Filled 2018-10-07 (×2): qty 1

## 2018-10-07 NOTE — Anesthesia Preprocedure Evaluation (Signed)

## 2018-10-07 NOTE — Anesthesia Pain Management Evaluation Note (Signed)
  CRNA Pain Management Visit Note  Patient: Theresa Winters, 33 y.o., female  "Hello I am a member of the anesthesia team at [facility]. We have an anesthesia team available at all times to provide care throughout the hospital, including epidural management and anesthesia for C-section. I don't know your plan for the delivery whether it a natural birth, water birth, IV sedation, nitrous supplementation, doula or epidural, but we want to meet your pain goals."   1.Was your pain managed to your expectations on prior hospitalizations?   Yes   2.What is your expectation for pain management during this hospitalization?     Epidural  3.How can we help you reach that goal? Support PRN  Record the patient's initial score and the patient's pain goal.   Pain: 8 - had just called for epidural during consult   Pain Goal: 5 [facility] wants you to be able to say your pain was always managed very well.  Jennelle Human 10/07/2018

## 2018-10-07 NOTE — H&P (Signed)
Theresa Winters is a 33 y.o. female, G3P1011 at 39+0 weeks, presenting for SROM at home at 0700 with some mild contractions.  Patient Active Problem List   Diagnosis Date Noted  . Amniotic fluid leaking 10/07/2018    History of present pregnancy: Patient entered care at [facility] at 32 weeks, transferring from Dr. Arther Abbott in [facility]. EDC of 10/14/18 was established by LMP.   Anatomy scan:  posterior placenta.   Additional [place] evaluations:  09/20/18 (36+4) growth scan,  EFW 3297g (74th%ile),  AFI 16.87, vertex Significant prenatal events:  HG early pregnancy   Last evaluation:  ROB 10/06/18  OB History    Gravida  3   Para  1   Term  1   Preterm  0   AB  1   Living  1     SAB  0   TAB  1   Ectopic  0   Multiple  0   Live Births  1          Past Medical History:  Diagnosis Date  . Medical history non-contributory   . Scoliosis   . Scoliosis    Past Surgical History:  Procedure Laterality Date  . THERAPEUTIC ABORTION     Family History: family history includes Depression in her father. Social History:  reports that she has never smoked. She has never used smokeless tobacco. She reports previous alcohol use. She reports that she does not use drugs.   Prenatal Transfer Tool  Maternal Diabetes: No Genetic Screening: Normal Maternal Ultrasounds/Referrals: Normal Fetal Ultrasounds or other Referrals:  None Maternal Substance Abuse:  No Significant Maternal Medications:  None Significant Maternal Lab Results: Lab values include: Group B Strep negative  TDAP no Flu no  ROS:  All ten systems reviewed and negative, except as noted. Denies VB.  Denies headache, epigastric pain, and visual sx. Reports good FM.   No Known Allergies   Dilation: 5 Effacement (%): 50 Station: -3 Exam by:: C.Volanda Mangine, CNM Blood pressure 123/72, pulse 90, temperature 98.1 F (36.7 C), temperature source Oral, resp. rate 18, weight 80.9 kg, last menstrual period  01/07/2018.  Chest clear Heart RRR without murmur Abd gravid, NT Pelvic: adequate Ext: No signs or symptoms of DVT  FHR: Category 1 presently, baseline 150, accels present, previously category 2 due to mild variable decelerations  UCs:  toco adjusted, irregular contractions  Prenatal labs: ABO, Rh: --/--/B POS (02/14 0865) Antibody: NEG (02/14 0934) Rubella:   Imm RPR:   NR HBsAg:   NR HIV:   NR GBS: Negative (01/23 0000) Sickle cell/Hgb electrophoresis:  GC:  Neg  Chlamydia:  neg Genetic screenings:  Quad screen negative Glucola:  135 Hgb 12.5 at NOB, 10.9 at 28 weeks  Special patient of Dr. Mora Appl  Assessment: 33 y.o., G3P1011 at 38+3 weeks PROM x 5h-Not feeling many painful contractions 5/50/-3  Plan: Admit to Birthing Suite per consult with [facility] Routine [facility] orders Augment with pitocin Pt desires epidural Dr. Mora Appl to deliver   Jonetta Speak, CNM 10/07/2018, 11:43 AM

## 2018-10-07 NOTE — Anesthesia Procedure Notes (Signed)
Epidural Patient location during procedure: OB Start time: 10/07/2018 12:32 PM End time: 10/07/2018 12:56 PM  Staffing Anesthesiologist: Lowella Curb, MD Performed: anesthesiologist   Preanesthetic Checklist Completed: patient identified, site marked, surgical consent, pre-op evaluation, timeout performed, IV checked, risks and benefits discussed and monitors and equipment checked  Epidural Patient position: sitting Prep: ChloraPrep Patient monitoring: heart rate, cardiac monitor, continuous pulse ox and blood pressure Approach: midline Location: L2-L3 Injection technique: LOR saline  Needle:  Needle type: Tuohy  Needle gauge: 17 G Needle length: 9 cm Needle insertion depth: 6 cm Catheter type: closed end flexible Catheter size: 20 Guage Catheter at skin depth: 10 cm Test dose: negative  Assessment Events: blood not aspirated, injection not painful, no injection resistance, negative IV test and no paresthesia  Additional Notes Reason for block:procedure for pain

## 2018-10-07 NOTE — Progress Notes (Signed)
Theresa Winters is a 33 y.o. G3P1011 at [redacted]w[redacted]d by LMP admitted for rupture of membranes early active labor  Subjective: Patient starting to feel more uncomfortable  Objective: BP 123/72   Pulse 90   Temp 98.1 F (36.7 C) (Oral)   Resp 18   Wt 80.9 kg   LMP 01/07/2018 (Exact Date)   BMI 27.92 kg/m   FHT:  FHR: 145 bpm, variability: moderate,  accelerations:  Abscent,  decelerations:  Present  variable decels with contractions  UC:   irregular, every 3-6 minutes SVE:   Dilation: 5 Effacement (%): 50 Station: -3 Exam by: W. Nicolle Heward  AROM of remaining forebag  Labs: Lab Results  Component Value Date   WBC 12.0 (H) 10/07/2018   HGB 11.4 (L) 10/07/2018   HCT 35.2 (L) 10/07/2018   MCV 87.1 10/07/2018   PLT 163 10/07/2018    Assessment / Plan: Augmentation of labor with pitocin s/p AROM of forebag Patient requesting epidural now  Labor: Progressing normally Preeclampsia:  no signs or symptoms of toxicity Fetal Wellbeing:  Category II  I/D:  n/a Anticipated MOD:  NSVD  Lorijean Husser STACIA 10/07/2018, 12:19 PM

## 2018-10-07 NOTE — MAU Note (Signed)
Patient states water broke at 0700, clear fluid.  Starting to have some CTX now.  Rates pain 3/10.  Previous vaginal delivery.

## 2018-10-07 NOTE — Progress Notes (Signed)
Labor Progress Note Theresa Winters is a 33 y.o. female, G3P1011 at 39+0 weeks.   Subjective:  Pt is a 0/10 on the pain scale, s/p epidural. Sitting up in throne position.   Objective:  BP 132/78   Pulse 87   Temp 97.9 F (36.6 C) (Oral)   Resp 18   Wt 80.9 kg   LMP 01/07/2018 (Exact Date)   SpO2 98%   BMI 27.92 kg/m     FHT: Baseline 130, moderate variability. No accels or decels past 30 minutes. Category 1. UC:   3-5 contractions in 10 minutes SVE:   Dilation: 5 Effacement (%): 50 Station: -3 Exam by:: Henderson Newcomer, RN Pitocin at 4 mu/min MVUs n/a  Assessment/Plan:  Fetal Wellbeing: Cat 1 Labor: No progress since this a.m.. IUPC placed without difficulty. Preeclampsia:  Normotensive GBS:   Neg Pain Control:  epidural Anticipated MOD:  SVD  Dr. Mora Appl updated  Jonetta Speak, CNM 10/07/2018, 2:32 PM

## 2018-10-08 LAB — CBC
HCT: 33.1 % — ABNORMAL LOW (ref 36.0–46.0)
Hemoglobin: 11 g/dL — ABNORMAL LOW (ref 12.0–15.0)
MCH: 28.4 pg (ref 26.0–34.0)
MCHC: 33.2 g/dL (ref 30.0–36.0)
MCV: 85.5 fL (ref 80.0–100.0)
Platelets: 156 10*3/uL (ref 150–400)
RBC: 3.87 MIL/uL (ref 3.87–5.11)
RDW: 15.1 % (ref 11.5–15.5)
WBC: 16.5 10*3/uL — ABNORMAL HIGH (ref 4.0–10.5)
nRBC: 0 % (ref 0.0–0.2)

## 2018-10-08 LAB — RPR: RPR Ser Ql: NONREACTIVE

## 2018-10-08 NOTE — Progress Notes (Signed)
Subjective: Postpartum Day 1: Vaginal delivery, no laceration Patient up ad lib, reports no syncope or dizziness.She is feeling very well, all prior sx resolved (CP and tachycardia during 2nd stage of labor). Has taken ibuprofen for pain but reports minimal discomfort.  Feeding:  Breast, going well! Contraceptive plan:  Mirena  Objective: Vital signs in last 24 hours: Temp:  [97.6 F (36.4 C)-98.4 F (36.9 C)] 97.9 F (36.6 C) (02/15 0630) Pulse Rate:  [63-129] 82 (02/15 0630) Resp:  [16-20] 18 (02/15 0630) BP: (108-136)/(55-96) 108/69 (02/15 0630) SpO2:  [97 %-100 %] 97 % (02/15 0630) Weight:  [80.9 kg] 80.9 kg (02/14 1701)  Physical Exam:  General: alert, cooperative and no distress Lochia: appropriate Uterine Fundus: firm Perineum: intact DVT Evaluation: No evidence of DVT seen on physical exam.   CBC Latest Ref Rng & Units 10/08/2018 10/07/2018 09/25/2017  WBC 4.0 - 10.5 K/uL 16.5(H) 12.0(H) 12.4(H)  Hemoglobin 12.0 - 15.0 g/dL 11.0(L) 11.4(L) 13.3  Hematocrit 36.0 - 46.0 % 33.1(L) 35.2(L) 39.1  Platelets 150 - 400 K/uL 156 163 267     Assessment/Plan: Status post vaginal delivery day 1. Stable Continue current care. Breastfeeding  Requests discharge this evening at the 24h mark    Theresa Winters 10/08/2018, 8:39 AM

## 2018-10-08 NOTE — Anesthesia Postprocedure Evaluation (Signed)
Anesthesia Post Note  Patient: Theresa Winters  Procedure(s) Performed: AN AD HOC LABOR EPIDURAL     Patient location during evaluation: Mother Baby Anesthesia Type: Epidural Level of consciousness: awake and alert Pain management: pain level controlled Vital Signs Assessment: post-procedure vital signs reviewed and stable Respiratory status: spontaneous breathing, nonlabored ventilation and respiratory function stable Cardiovascular status: stable Postop Assessment: no headache, no backache, epidural receding, patient able to bend at knees and able to ambulate Anesthetic complications: no    Last Vitals:  Vitals:   10/08/18 0234 10/08/18 0630  BP: 120/81 108/69  Pulse: 63 82  Resp: 18 18  Temp: 36.4 C 36.6 C  SpO2: 97% 97%    Last Pain:  Vitals:   10/08/18 0740  TempSrc:   PainSc: Asleep   Pain Goal:                   Rica Records

## 2018-10-08 NOTE — Lactation Note (Signed)
This note was copied from a baby's chart. Lactation Consultation Note  Patient Name: Theresa Winters KPQAE'[id number] Date: 10/08/2018 Reason for consult: Initial assessment;Term  Baby is 27 hours old  This is an experienced BF mom / its been 7 years ( total of 22 months )  Per mom the baby has been sleepy today.  LC reviewed breast feeding basics and feeding behaviors especially in the 1st 24 - 48 hours.  Mom mentioned the baby was a fast delivery and has been spitty.  LC explained it normal for them not to act like they want to eat.  LC stressed STS for feedings and in between to enhance the breast  Feeding hormones.  LC reviewed doc flow sheets with mom and dad / updated.  Grandmother changed 1 wet and LC changed 1 ( large wet )  LC reviewed hand expressing / and she was able to return demo well/ 1/2 ml  spoon fed to baby. After spoon feeding she was smacking her tongue.   LC assisted to latch in the football  On the right breast for 7 mins with flanged lips / swallows / increased  With breast compressions. Baby became non - nutritive so LC had mom release Suction / Nipple well rounded and mom reported the feeding was comfortable .  Per mom feeling sleepy.  Per mom has DEBP / ? The name brand.  Mother informed of post-discharge support and given phone number to the lactation department, including services for phone call assistance; out-patient appointments; and breastfeeding support group. List of other breastfeeding resources in the community given in the handout. Encouraged mother to call for problems or concerns related to breastfeeding.    Maternal Data Has patient been taught Hand Expression?: Yes Does the patient have breastfeeding experience prior to this delivery?: Yes  Feeding Feeding Type: Breast Fed  LATCH Score Latch: Grasps breast easily, tongue down, lips flanged, rhythmical sucking.  Audible Swallowing: A few with stimulation  Type of Nipple: Everted at rest and  after stimulation  Comfort (Breast/Nipple): Soft / non-tender  Hold (Positioning): Assistance needed to correctly position infant at breast and maintain latch.  LATCH Score: 8  Interventions Interventions: Breast feeding basics reviewed;Assisted with latch;Skin to skin;Breast massage;Hand express;Breast compression;Adjust position;Support pillows;Position options  Lactation Tools Discussed/Used     Consult Status Consult Status: Follow-up Date: 10/09/18 Follow-up type: In-patient    [name] 10/08/2018, 2:03 PM

## 2018-10-09 MED ORDER — IBUPROFEN 600 MG PO TABS: 600.0000 mg | ORAL_TABLET | Freq: Four times a day (QID) | ORAL | 0 refills | Status: DC

## 2018-10-09 NOTE — Lactation Note (Addendum)
This note was copied from a baby's chart. Lactation Consultation Note  Patient Name: Theresa Winters BSWHQ'P Date: 10/09/2018   Baby 37 hours old.  Ex BF.  Mother denies concerns or questions. Pacifier use not recommended at this time. Provided education. Feed on demand approximately 8-12 times per day.   Reviewed engorgement care and monitoring voids/stools.  Returned to room to observe latch.  Mother latched baby in cradle hold. Suggest placing pillow under baby to bring her to nipple height. Mother hand expressed drops. Observed feeding with sucks and swallows. Encouraged mother to hand express and give baby additional breastmilk due to increased bilirubin and short posterior lingual frenulum. Suggest supplementing with breastmilk until bilirubin and weight stabilize. Mother has DEBP at home. Provided mother w/ manual pump and foley cup.        Maternal Data    Feeding Feeding Type: Breast Fed  LATCH Score                   Interventions    Lactation Tools Discussed/Used     Consult Status      Hardie Pulley 10/09/2018, 8:43 AM

## 2018-10-09 NOTE — Discharge Summary (Signed)
OB Discharge Summary     Patient Name: Theresa Winters DOB: February 19, 1986 MRN: [id number]  Date of admission: 10/07/2018 Delivering MD: Jonetta Speak A   Date of discharge: 10/09/2018  Admitting diagnosis: WATER BROKE Intrauterine pregnancy: [redacted]w[redacted]d     Secondary diagnosis:  Active Problems:   Vaginal delivery  Additional problems: scoliosis     Discharge diagnosis: Term Pregnancy Delivered                                                                                                Post partum procedures:none  Augmentation: AROM and Pitocin  Complications: None  Hospital course:  Onset of Labor With Vaginal Delivery     33 y.o. yo [id number] at [redacted]w[redacted]d was admitted in Latent Labor on 10/07/2018. Patient had an uncomplicated labor course as follows:  Membrane Rupture Time/Date: 7:00 AM ,10/07/2018   Intrapartum Procedures: Episiotomy: None [1]                                         Lacerations:  None [1]  Patient had a delivery of a Viable infant. 10/07/2018  Information for the patient's newborn:  [place], Escandon Girl Marshea [[id number]]       Pateint had an uncomplicated postpartum course.  She is ambulating, tolerating a regular diet, passing flatus, and urinating well. Patient is discharged home in stable condition on 10/09/18.   Physical exam  Vitals:   10/08/18 1045 10/08/18 1429 10/08/18 2212 10/09/18 0519  BP: 108/69 114/63 119/89 122/86  Pulse: 71 70 62 (!) 56  Resp: 18 17 18 16   Temp: 98.4 F (36.9 C) 97.9 F (36.6 C) 97.7 F (36.5 C) 97.7 F (36.5 C)  TempSrc: Oral Oral Oral Oral  SpO2:  97%    Weight:      Height:       General: alert, cooperative and no distress Lochia: appropriate Uterine Fundus: firm Incision: N/A DVT Evaluation: No evidence of DVT seen on physical exam. Labs: Lab Results  Component Value Date   WBC 16.5 (H) 10/08/2018   HGB 11.0 (L) 10/08/2018   HCT 33.1 (L) 10/08/2018   MCV 85.5 10/08/2018   PLT 156 10/08/2018   CMP Latest Ref  Rng & Units 09/25/2017  Glucose 65 - 99 mg/dL 676(H)  BUN 6 - 20 mg/dL 15  Creatinine 2.09 - 4.70 mg/dL 9.62(E)  Sodium 366 - 294 mmol/L 139  Potassium 3.5 - 5.1 mmol/L 3.1(L)  Chloride 101 - 111 mmol/L 107  CO2 22 - 32 mmol/L 19(L)  Calcium 8.9 - 10.3 mg/dL 9.4  Total Protein 6.5 - 8.1 g/dL -  Total Bilirubin 0.3 - 1.2 mg/dL -  Alkaline Phos 38 - 765 U/L -  AST 15 - 41 U/L -  ALT 14 - 54 U/L -    Discharge instruction: per After Visit Summary and "Baby and Me Booklet".  After visit meds: PNV, ibuprofen   Diet: routine diet  Activity: Advance as tolerated. Pelvic rest for 6 weeks.   Outpatient  follow up:6 weeks Follow up Appt:No future appointments. Follow up Visit:No follow-ups on file.  Postpartum contraception: IUD Mirena  Newborn Data: Live born female  Birth Weight: 7 lb 11.3 oz (3496 g) APGAR: 7, 9  Newborn Delivery   Birth date/time:  10/07/2018 19:20:00 Delivery type:  Vaginal, Spontaneous     Baby Feeding: Breast Disposition:home with mother   10/09/2018 Jonetta Speak, CNM

## 2018-10-12 ENCOUNTER — Inpatient Hospital Stay (HOSPITAL_COMMUNITY): Payer: Medicaid Other

## 2018-10-12 ENCOUNTER — Encounter (HOSPITAL_COMMUNITY): Payer: Self-pay

## 2018-10-12 ENCOUNTER — Other Ambulatory Visit: Payer: Self-pay

## 2018-10-12 ENCOUNTER — Inpatient Hospital Stay (HOSPITAL_COMMUNITY)
Admission: AD | Admit: 2018-10-12 | Discharge: 2018-10-14 | DRG: 776 | Disposition: A | Discharge status: Home / Self Care | Payer: Medicaid Other | Attending: Obstetrics and Gynecology | Admitting: Obstetrics and Gynecology

## 2018-10-12 DIAGNOSIS — R079 Chest pain, unspecified: Secondary | ICD-10-CM

## 2018-10-12 DIAGNOSIS — O1415 Severe pre-eclampsia, complicating the puerperium: Secondary | ICD-10-CM | POA: Diagnosis present

## 2018-10-12 DIAGNOSIS — O1410 Severe pre-eclampsia, unspecified trimester: Secondary | ICD-10-CM | POA: Diagnosis present

## 2018-10-12 DIAGNOSIS — R0600 Dyspnea, unspecified: Secondary | ICD-10-CM | POA: Diagnosis not present

## 2018-10-12 HISTORY — DX: Anemia, unspecified: D64.9

## 2018-10-12 HISTORY — DX: Dyspnea, unspecified: R06.00

## 2018-10-12 LAB — COMPREHENSIVE METABOLIC PANEL
ALBUMIN: 2.9 g/dL — AB (ref 3.5–5.0)
ALT: 87 U/L — ABNORMAL HIGH (ref 0–44)
AST: 83 U/L — ABNORMAL HIGH (ref 15–41)
Alkaline Phosphatase: 124 U/L (ref 38–126)
Anion gap: 7 (ref 5–15)
BUN: 14 mg/dL (ref 6–20)
CALCIUM: 8.2 mg/dL — AB (ref 8.9–10.3)
CHLORIDE: 110 mmol/L (ref 98–111)
CO2: 21 mmol/L — AB (ref 22–32)
CREATININE: 0.77 mg/dL (ref 0.44–1.00)
GFR calc Af Amer: >60 mL/min (ref >60)
GFR calc non Af Amer: >60 mL/min (ref >60)
Glucose, Bld: 69 mg/dL — ABNORMAL LOW (ref 70–99)
Potassium: 3.9 mmol/L (ref 3.5–5.1)
SODIUM: 138 mmol/L (ref 135–145)
Total Bilirubin: 0.3 mg/dL (ref 0.3–1.2)
Total Protein: 6.2 g/dL — ABNORMAL LOW (ref 6.5–8.1)

## 2018-10-12 LAB — CBC WITH DIFFERENTIAL/PLATELET
BASOS PCT: 0 %
Basophils Absolute: 0 10*3/uL (ref 0.0–0.1)
Eosinophils Absolute: 0.2 10*3/uL (ref 0.0–0.5)
Eosinophils Relative: 2 %
HCT: 35.2 % — ABNORMAL LOW (ref 36.0–46.0)
Hemoglobin: 11.7 g/dL — ABNORMAL LOW (ref 12.0–15.0)
Lymphocytes Relative: 20 %
Lymphs Abs: 2 10*3/uL (ref 0.7–4.0)
MCH: 28.5 pg (ref 26.0–34.0)
MCHC: 33.2 g/dL (ref 30.0–36.0)
MCV: 85.6 fL (ref 80.0–100.0)
Monocytes Absolute: 0.6 10*3/uL (ref 0.1–1.0)
Monocytes Relative: 6 %
Neutro Abs: 7.2 10*3/uL (ref 1.7–7.7)
Neutrophils Relative %: 72 %
Platelets: 191 10*3/uL (ref 150–400)
RBC: 4.11 MIL/uL (ref 3.87–5.11)
RDW: 14.7 % (ref 11.5–15.5)
WBC: 10.1 10*3/uL (ref 4.0–10.5)
nRBC: 0 % (ref 0.0–0.2)

## 2018-10-12 LAB — BRAIN NATRIURETIC PEPTIDE: B Natriuretic Peptide: 278.2 pg/mL — ABNORMAL HIGH (ref 0.0–100.0)

## 2018-10-12 LAB — CK TOTAL AND CKMB (NOT AT ARMC)
CK, MB: 2.5 ng/mL (ref 0.5–5.0)
Relative Index: 1.3 (ref 0.0–2.5)
Total CK: 197 U/L (ref 38–234)

## 2018-10-12 LAB — TROPONIN I: Troponin I: <0.03 ng/mL (ref <0.03)

## 2018-10-12 LAB — LACTATE DEHYDROGENASE: LDH: 272 U/L — ABNORMAL HIGH (ref 98–192)

## 2018-10-12 LAB — D-DIMER, QUANTITATIVE: D-Dimer, Quant: 2.66 ug/mL-FEU — ABNORMAL HIGH (ref 0.00–0.50)

## 2018-10-12 LAB — PROTEIN / CREATININE RATIO, URINE
Creatinine, Urine: 228 mg/dL
Protein Creatinine Ratio: 0.07 mg/mg{Cre} (ref 0.00–0.15)
Total Protein, Urine: 17 mg/dL

## 2018-10-12 LAB — URIC ACID: Uric Acid, Serum: 6.6 mg/dL (ref 2.5–7.1)

## 2018-10-12 MED ORDER — LABETALOL HCL 5 MG/ML IV SOLN: 20.0000 mg | INTRAVENOUS | Status: DC | PRN

## 2018-10-12 MED ORDER — ALUM & MAG HYDROXIDE-SIMETH 200-200-20 MG/5ML PO SUSP
30.0000 mL | Freq: Once | ORAL | Status: DC
  Filled 2018-10-12: qty 30

## 2018-10-12 MED ORDER — FUROSEMIDE 10 MG/ML IJ SOLN
40.0000 mg | Freq: Once | INTRAMUSCULAR | Status: AC
  Administered 2018-10-12: 40 mg via INTRAVENOUS
  Filled 2018-10-12 (×2): qty 4

## 2018-10-12 MED ORDER — LACTATED RINGERS IV SOLN
INTRAVENOUS | Status: AC
  Administered 2018-10-12 – 2018-10-13 (×2): via INTRAVENOUS

## 2018-10-12 MED ORDER — HYDRALAZINE HCL 20 MG/ML IJ SOLN: 10.0000 mg | INTRAMUSCULAR | Status: DC | PRN

## 2018-10-12 MED ORDER — LABETALOL HCL 5 MG/ML IV SOLN
80.0000 mg | INTRAVENOUS | Status: DC | PRN
  Administered 2018-10-12: 80 mg via INTRAVENOUS
  Filled 2018-10-12: qty 16

## 2018-10-12 MED ORDER — IBUPROFEN 600 MG PO TABS
600.0000 mg | ORAL_TABLET | Freq: Four times a day (QID) | ORAL | Status: DC
  Administered 2018-10-13 – 2018-10-14 (×4): 600 mg via ORAL
  Filled 2018-10-12 (×6): qty 1

## 2018-10-12 MED ORDER — MAGNESIUM SULFATE 40 G IN LACTATED RINGERS - SIMPLE
2.0000 g/h | INTRAVENOUS | Status: DC
  Administered 2018-10-12: 2 g/h via INTRAVENOUS
  Filled 2018-10-12: qty 500

## 2018-10-12 MED ORDER — MAGNESIUM SULFATE BOLUS VIA INFUSION
4.0000 g | Freq: Once | INTRAVENOUS | Status: AC
  Administered 2018-10-12: 4 g via INTRAVENOUS
  Filled 2018-10-12: qty 500

## 2018-10-12 MED ORDER — LABETALOL HCL 5 MG/ML IV SOLN: 40.0000 mg | INTRAVENOUS | Status: DC | PRN

## 2018-10-12 MED ORDER — ALUM & MAG HYDROXIDE-SIMETH 200-200-20 MG/5ML PO SUSP
30.0000 mL | Freq: Once | ORAL | Status: AC
  Administered 2018-10-12: 30 mL via ORAL
  Filled 2018-10-12: qty 30

## 2018-10-12 MED ORDER — LABETALOL HCL 5 MG/ML IV SOLN
20.0000 mg | INTRAVENOUS | Status: DC | PRN
  Administered 2018-10-12: 20 mg via INTRAVENOUS
  Filled 2018-10-12: qty 4

## 2018-10-12 MED ORDER — LABETALOL HCL 5 MG/ML IV SOLN
40.0000 mg | INTRAVENOUS | Status: DC | PRN
  Administered 2018-10-12: 40 mg via INTRAVENOUS
  Filled 2018-10-12: qty 8

## 2018-10-12 MED ORDER — LABETALOL HCL 5 MG/ML IV SOLN: 80.0000 mg | INTRAVENOUS | Status: DC | PRN

## 2018-10-12 MED ORDER — MAGNESIUM SULFATE 40 G IN LACTATED RINGERS - SIMPLE
2.0000 g/h | INTRAVENOUS | Status: AC
  Administered 2018-10-13: 2 g/h via INTRAVENOUS
  Filled 2018-10-12: qty 500

## 2018-10-12 MED ORDER — IOPAMIDOL (ISOVUE-370) INJECTION 76%
100.0000 mL | Freq: Once | INTRAVENOUS | Status: AC | PRN
  Administered 2018-10-12: 100 mL via INTRAVENOUS

## 2018-10-12 NOTE — MAU Provider Note (Addendum)
History     CSN: [id number]  Arrival date and time: 10/12/18 1642   None     Chief Complaint  Patient presents with  . Shortness of Breath  . Wheezing  . Headache   Pt is 5 days postpartum with SVD. Pt presents with SOB and chest pain for past five days.  She states these symptoms are persistent and nothing relieves them. Pt states worse when lying on left side or flat.  Able to sleep on right side.  Pt had a bad headache last not but denies it tonight. Denies blurred vision or epigastric pain. Denies any past history of DVT.   During labor,the patient reported chest pain, pressure, and shortness of breath during the active second stage of her labor. She was tachycardic in the 140s at the time and O2 sats were in the mid 90s. After the birth of her baby, patient's symptoms resolved and her heartbeat was no longer tachycardic and her O2 sats rose to 99 or 100. Patient stated she thought those symptoms were related to anxiety at that time. Patient was discharged home in stable condition and once home noted a return of chest pain 8/10 and centralized pressure as well as shortness of breath.    Patient has a history of palpitations and was seen by a cardiologist who did not note any abnormalities on her heart monitor tracings other than occasional PACs and PVCs.     OB History    Gravida  3   Para  2   Term  2   Preterm  0   AB  1   Living  2     SAB  0   TAB  1   Ectopic  0   Multiple  0   Live Births  2           Past Medical History:  Diagnosis Date  . Anemia    with pregnancy  . Dyspnea   . Medical history non-contributory   . Scoliosis   . Scoliosis     Past Surgical History:  Procedure Laterality Date  . THERAPEUTIC ABORTION      Family History  Problem Relation Age of Onset  . Depression Father     Social History   Tobacco Use  . Smoking status: Never Smoker  . Smokeless tobacco: Never Used  Substance Use Topics  . Alcohol use: Not  Currently    Comment: occ  . Drug use: No    Allergies: No Known Allergies  Medications Prior to Admission  Medication Sig Dispense Refill Last Dose  . ibuprofen (ADVIL,MOTRIN) 600 MG tablet Take 1 tablet (600 mg total) by mouth every 6 (six) hours. 30 tablet 0   . prenatal vitamin w/FE, FA (NATACHEW) 29-1 MG CHEW chewable tablet Chew 1 tablet by mouth daily at 12 noon.   Past Week at Unknown time    Review of Systems  Eyes: Negative for visual disturbance.  Respiratory: Positive for chest tightness and shortness of breath. Negative for wheezing.   Cardiovascular: Positive for chest pain.  Gastrointestinal: Negative for abdominal pain.  Neurological: Negative for headaches.  All other systems reviewed and are negative.  Physical Exam   Vitals:   10/12/18 2030 10/12/18 2045 10/12/18 2115 10/12/18 2130  BP: (!) 147/83 131/78 128/73 130/73  Pulse: 62 62 (!) 50 (!) 54  Resp:      Temp:      TempSrc:      SpO2: 100%  Weight:      CT see report result negative  Physical Exam  Nursing note and vitals reviewed. Constitutional: She is oriented to person, place, and time. She appears well-developed and well-nourished.  HENT:  Head: Normocephalic and atraumatic.  Eyes: Pupils are equal, round, and reactive to light.  Cardiovascular: Regular rhythm, normal heart sounds and intact distal pulses.  Respiratory: Effort normal and breath sounds normal. No respiratory distress. She has no wheezes.  GI: Soft. Bowel sounds are normal. There is no abdominal tenderness.  Musculoskeletal: Normal range of motion.  Neurological: She is alert and oriented to person, place, and time.  Skin: Skin is warm and dry.  Psychiatric: She has a normal mood and affect. Her behavior is normal. Judgment and thought content normal.    MAU Course  Procedures Preeclampsia labs CXR D-Dimer EKG BNP, Troponins, CKMB  MDM No symptoms of DVT and patient has SPO2 of 100% which are not consistent with  PE. However D-dimer is elevated at 2.66. Chest X-ray is unremarkable, and EKG shows sinus bradycardia. PCR is negative for preeclampsia and there is no thrombocytopenia but AST and ALT are both elevated at 83 and 87. LDH is also elevated at 272. With patient's severe range pressures (even with lack of preeclampsia symptoms) this could be atypical presentation of severe preeclampsia. Cardiac labs are still pending at this time. Differentials are atypical severe preeclampsia vs PE vs MI vs postpartum cardiomyopathy. Dr. Sallye Ober at bedside to evaluate patient, stat cardiology consult ordered and MD called regarding consult. Awaiting cardiology presence at bedside.   Dr. Caro Hight from Cardiology evaluated the patient and felt she was stable after reviewing the results.  He will place order for heart echo in the am.  Give Lasix IV Due to patient SOB when flat and on left side will do spiral CT.  Dr. Dion Body aware.     Assessment and Plan  33 y.o. G3P2 at 5 days postpartum from a NSVD with SOB and chest pain Admit to third floor  Postpartum pre eclampsia with severe features P; Magnesium Sulfate 4 Gm loading dose than 2 Gm an hour Labetalol per protocol Lasix 40 meq IV     Henderson Newcomer Alois Colgan 10/12/2018, 9:46 PM

## 2018-10-12 NOTE — MAU Note (Signed)
Vag del 2/14. Started on Mon night.  Shortness of breath.  When she lays on her left side, sounds like wheezing.  Been having headaches.

## 2018-10-13 ENCOUNTER — Inpatient Hospital Stay (HOSPITAL_COMMUNITY): Payer: Medicaid Other

## 2018-10-13 DIAGNOSIS — O1415 Severe pre-eclampsia, complicating the puerperium: Secondary | ICD-10-CM

## 2018-10-13 DIAGNOSIS — R0600 Dyspnea, unspecified: Secondary | ICD-10-CM

## 2018-10-13 LAB — COMPREHENSIVE METABOLIC PANEL
ALK PHOS: 108 U/L (ref 38–126)
ALT: 98 U/L — AB (ref 0–44)
AST: 74 U/L — ABNORMAL HIGH (ref 15–41)
Albumin: 3 g/dL — ABNORMAL LOW (ref 3.5–5.0)
Anion gap: 9 (ref 5–15)
BUN: 10 mg/dL (ref 6–20)
CALCIUM: 7.1 mg/dL — AB (ref 8.9–10.3)
CO2: 23 mmol/L (ref 22–32)
Chloride: 105 mmol/L (ref 98–111)
Creatinine, Ser: 0.79 mg/dL (ref 0.44–1.00)
GFR calc Af Amer: >60 mL/min (ref >60)
GFR calc non Af Amer: >60 mL/min (ref >60)
Glucose, Bld: 105 mg/dL — ABNORMAL HIGH (ref 70–99)
Potassium: 3.3 mmol/L — ABNORMAL LOW (ref 3.5–5.1)
Sodium: 137 mmol/L (ref 135–145)
Total Bilirubin: 0.6 mg/dL (ref 0.3–1.2)
Total Protein: 6.5 g/dL (ref 6.5–8.1)

## 2018-10-13 LAB — ECHOCARDIOGRAM COMPLETE
Height: 67 in
Weight: 2659.63 oz

## 2018-10-13 LAB — CBC WITH DIFFERENTIAL/PLATELET
BASOS ABS: 0 10*3/uL (ref 0.0–0.1)
Basophils Relative: 0 %
Eosinophils Absolute: 0.2 10*3/uL (ref 0.0–0.5)
Eosinophils Relative: 2 %
HCT: 34.6 % — ABNORMAL LOW (ref 36.0–46.0)
Hemoglobin: 11.5 g/dL — ABNORMAL LOW (ref 12.0–15.0)
Lymphocytes Relative: 12 %
Lymphs Abs: 1.1 10*3/uL (ref 0.7–4.0)
MCH: 28 pg (ref 26.0–34.0)
MCHC: 33.2 g/dL (ref 30.0–36.0)
MCV: 84.2 fL (ref 80.0–100.0)
Monocytes Absolute: 0.8 10*3/uL (ref 0.1–1.0)
Monocytes Relative: 8 %
Neutro Abs: 7.4 10*3/uL (ref 1.7–7.7)
Neutrophils Relative %: 78 %
Platelets: 209 10*3/uL (ref 150–400)
RBC: 4.11 MIL/uL (ref 3.87–5.11)
RDW: 14.9 % (ref 11.5–15.5)
WBC: 9.6 10*3/uL (ref 4.0–10.5)

## 2018-10-13 MED ORDER — BUTALBITAL-APAP-CAFFEINE 50-325-40 MG PO TABS: 1.0000 | ORAL_TABLET | ORAL | Status: DC | PRN

## 2018-10-13 NOTE — Progress Notes (Addendum)
Post Partum Day 6 Subjective: no complaints, up ad lib, voiding and tolerating PO. Patient reports feeling well, had a headache this morning but Ibuprofen improved it. She denies symptoms of preeclampsia at this time. Plan Echo for this morning. Patient continuing on Magnesium Sulfate until 1900.   Objective: Vitals:   10/12/18 2200 10/12/18 2231 10/13/18 0300 10/13/18 0807  BP:  124/75 103/64 126/72  Pulse:  (!) 56 60 64  Resp:  17 17 18   Temp:  97.6 F (36.4 C) 98.4 F (36.9 C) 97.7 F (36.5 C)  TempSrc:  Oral Oral Oral  SpO2:  100% 99% 100%  Weight: 75.4 kg     Height: 5\' 7"  (1.702 m)       Physical Exam:  General: alert and cooperative Lochia: appropriate Uterine Fundus: firm Incision: n/a DVT Evaluation: No evidence of DVT seen on physical exam. Negative Homan's sign. No cords or calf tenderness. No significant calf/ankle edema. DTRs +1  Recent Labs    10/12/18 1728  HGB 11.7*  HCT 35.2*    Assessment/Plan: 33 y.o. G3P2 at 6 days postpartum Severe preeclampsia on MgSO4 Cardiac consult completed last night, note pending  Echo pending  CBC/CMP ordered, results pending  Will continue to monitor blood pressures and symptoms today   LOS: 1 day   Janeece Riggers 10/13/2018, 8:25 AM   Echo report - wnl.  Pt informed.

## 2018-10-13 NOTE — Progress Notes (Signed)
  Echocardiogram 2D Echocardiogram has been performed.  Leta Jungling M 10/13/2018, 10:37 AM

## 2018-10-14 DIAGNOSIS — O1410 Severe pre-eclampsia, unspecified trimester: Secondary | ICD-10-CM | POA: Diagnosis present

## 2018-10-14 LAB — CBC WITH DIFFERENTIAL/PLATELET
Basophils Absolute: 0 10*3/uL (ref 0.0–0.1)
Basophils Relative: 0 %
Eosinophils Absolute: 0.3 10*3/uL (ref 0.0–0.5)
Eosinophils Relative: 3 %
HCT: 36.1 % (ref 36.0–46.0)
Hemoglobin: 11.7 g/dL — ABNORMAL LOW (ref 12.0–15.0)
Lymphocytes Relative: 24 %
Lymphs Abs: 2.1 10*3/uL (ref 0.7–4.0)
MCH: 28.1 pg (ref 26.0–34.0)
MCHC: 32.4 g/dL (ref 30.0–36.0)
MCV: 86.6 fL (ref 80.0–100.0)
MONOS PCT: 5 %
Monocytes Absolute: 0.4 10*3/uL (ref 0.1–1.0)
NEUTROS PCT: 68 %
Neutro Abs: 6 10*3/uL (ref 1.7–7.7)
Platelets: 233 10*3/uL (ref 150–400)
RBC: 4.17 MIL/uL (ref 3.87–5.11)
RDW: 15.1 % (ref 11.5–15.5)
WBC: 8.9 10*3/uL (ref 4.0–10.5)
nRBC: 0 % (ref 0.0–0.2)

## 2018-10-14 LAB — COMPREHENSIVE METABOLIC PANEL
ALT: 71 U/L — ABNORMAL HIGH (ref 0–44)
AST: 35 U/L (ref 15–41)
Albumin: 2.8 g/dL — ABNORMAL LOW (ref 3.5–5.0)
Alkaline Phosphatase: 103 U/L (ref 38–126)
Anion gap: 7 (ref 5–15)
BILIRUBIN TOTAL: 0.6 mg/dL (ref 0.3–1.2)
BUN: 12 mg/dL (ref 6–20)
CO2: 23 mmol/L (ref 22–32)
Calcium: 6.4 mg/dL — CL (ref 8.9–10.3)
Chloride: 105 mmol/L (ref 98–111)
Creatinine, Ser: 0.87 mg/dL (ref 0.44–1.00)
GFR calc Af Amer: >60 mL/min (ref >60)
GFR calc non Af Amer: >60 mL/min (ref >60)
Glucose, Bld: 93 mg/dL (ref 70–99)
Potassium: 3.3 mmol/L — ABNORMAL LOW (ref 3.5–5.1)
Sodium: 135 mmol/L (ref 135–145)
Total Protein: 5.6 g/dL — ABNORMAL LOW (ref 6.5–8.1)

## 2018-10-14 MED ORDER — BUTALBITAL-APAP-CAFFEINE 50-325-40 MG PO TABS: 1.0000 | ORAL_TABLET | ORAL | 0 refills | Status: DC | PRN

## 2018-10-14 NOTE — Progress Notes (Signed)
Pt ambulated out teaching complete  

## 2018-10-14 NOTE — Progress Notes (Signed)
This is a late entry note for a visit that occurred yesterday. I received a referral that pt was tearful due to being separated from her baby as a post partum re-admit.  She was told that baby could come up to the hospital with another adult, but FOB did not wish to bring baby to the hospital.  Pt was concerned about him waking up at night with the baby after working all day.  I spoke with pt and affirmed that the separation anxiety this soon after birth is understandable and also worked with midwife to reassure her that her baby would be okay in FOB's care.  She had no concerns about his ability to care for the child during the day, but was concerned about him waking up.  I brainstormed ways for her to get the reassurance she needed while also caring for herself and giving herself the rest she needed to recover.  I checked in this morning and she was doing well and FOB and baby did well during the night.  She was grateful for the support.  Chaplain Katy Valdez Brannan, Bcc Pager, [phone] 11:35 AM    10/14/18 1100  Clinical Encounter Type  Visited With Patient  Visit Type Spiritual support  Referral From Nurse  Spiritual Encounters  Spiritual Needs Emotional

## 2018-10-14 NOTE — Discharge Summary (Signed)
Obstetric Discharge Summary Reason for Admission: onset of labor Prenatal Procedures: none Intrapartum Procedures: spontaneous vaginal delivery Postpartum Procedures: none Complications-Operative and Postpartum: P.P. eclampsia Hemoglobin  Date Value Ref Range Status  10/14/2018 11.7 (L) 12.0 - 15.0 g/dL Final   HCT  Date Value Ref Range Status  10/14/2018 36.1 36.0 - 46.0 % Final    Physical Exam:  General: alert and cooperative Lochia: appropriate Uterine Fundus: firm Incision: na DVT Evaluation: No evidence of DVT seen on physical exam. Normal relexes  Discharge Diagnoses: Term Pregnancy-delivered and Preelampsia  Pt readmitted for pp PREECLAMPSIA.   Her echo was WNL.   Discharge Information: Date: 10/14/2018 Activity: pelvic rest Diet: routine Medications: PNV and fioricet Condition: stable Instructions: refer to practice specific booklet Discharge to: home   Newborn Data: This patient has no babies on file. Home with mother.  Theresa Winters 10/14/2018, 1:57 PM

## 2018-10-14 NOTE — Lactation Note (Signed)
Lactation Consultation Note  Patient Name: Theresa Winters [id number] Date: 10/14/2018    Mother is a post partum readmit.    Mother has been pumping every 3-4 hours and stated, "I have a refirigerator full of milk!"  Praised her for her efforts in continuing to pump consistently.  Mother stated that baby did well with the father last night but she is still saddened to be separated from baby.  Discussed ways to be involved with baby's daily activities like phoning, texting and using face time.  Mother did face time recently and she felt more at ease.  She will call for any questions/concerns she may have.                Lyrick Worland R Jaz Mallick 10/14/2018, 12:12 PM

## 2018-10-15 ENCOUNTER — Observation Stay (HOSPITAL_COMMUNITY)
Admission: AD | Admit: 2018-10-15 | Discharge: 2018-10-16 | Disposition: A | Discharge status: Home / Self Care | Payer: Medicaid Other | Source: Ambulatory Visit | Attending: Obstetrics and Gynecology | Admitting: Obstetrics and Gynecology

## 2018-10-15 ENCOUNTER — Encounter (HOSPITAL_COMMUNITY): Payer: Self-pay | Admitting: *Deleted

## 2018-10-15 DIAGNOSIS — R51 Headache: Secondary | ICD-10-CM | POA: Insufficient documentation

## 2018-10-15 DIAGNOSIS — O165 Unspecified maternal hypertension, complicating the puerperium: Secondary | ICD-10-CM | POA: Diagnosis present

## 2018-10-15 DIAGNOSIS — O1415 Severe pre-eclampsia, complicating the puerperium: Secondary | ICD-10-CM | POA: Diagnosis present

## 2018-10-15 DIAGNOSIS — Z79899 Other long term (current) drug therapy: Secondary | ICD-10-CM | POA: Insufficient documentation

## 2018-10-15 LAB — CBC WITH DIFFERENTIAL/PLATELET
Basophils Absolute: 0 10*3/uL (ref 0.0–0.1)
Basophils Relative: 0 %
Eosinophils Absolute: 0.2 10*3/uL (ref 0.0–0.5)
Eosinophils Relative: 2 %
HCT: 40.3 % (ref 36.0–46.0)
Hemoglobin: 13.1 g/dL (ref 12.0–15.0)
Lymphocytes Relative: 28 %
Lymphs Abs: 2.9 10*3/uL (ref 0.7–4.0)
MCH: 28.4 pg (ref 26.0–34.0)
MCHC: 32.5 g/dL (ref 30.0–36.0)
MCV: 87.4 fL (ref 80.0–100.0)
Monocytes Absolute: 0.5 10*3/uL (ref 0.1–1.0)
Monocytes Relative: 5 %
Neutro Abs: 6.7 10*3/uL (ref 1.7–7.7)
Neutrophils Relative %: 65 %
Platelets: 231 10*3/uL (ref 150–400)
RBC: 4.61 MIL/uL (ref 3.87–5.11)
RDW: 14.8 % (ref 11.5–15.5)
WBC: 10.3 10*3/uL (ref 4.0–10.5)
nRBC: 0 % (ref 0.0–0.2)

## 2018-10-15 LAB — URINALYSIS, ROUTINE W REFLEX MICROSCOPIC
Bilirubin Urine: NEGATIVE
Glucose, UA: NEGATIVE mg/dL
Ketones, ur: NEGATIVE mg/dL
NITRITE: NEGATIVE
PH: 6 (ref 5.0–8.0)
Protein, ur: NEGATIVE mg/dL
Specific Gravity, Urine: <1.005 — ABNORMAL LOW (ref 1.005–1.030)

## 2018-10-15 LAB — COMPREHENSIVE METABOLIC PANEL
ALT: 46 U/L — ABNORMAL HIGH (ref 0–44)
ANION GAP: 8 (ref 5–15)
AST: 20 U/L (ref 15–41)
Albumin: 3.2 g/dL — ABNORMAL LOW (ref 3.5–5.0)
Alkaline Phosphatase: 115 U/L (ref 38–126)
BUN: 14 mg/dL (ref 6–20)
CO2: 22 mmol/L (ref 22–32)
Calcium: 8.4 mg/dL — ABNORMAL LOW (ref 8.9–10.3)
Chloride: 109 mmol/L (ref 98–111)
Creatinine, Ser: 0.81 mg/dL (ref 0.44–1.00)
GFR calc Af Amer: >60 mL/min (ref >60)
GFR calc non Af Amer: >60 mL/min (ref >60)
GLUCOSE: 80 mg/dL (ref 70–99)
Potassium: 4.2 mmol/L (ref 3.5–5.1)
Sodium: 139 mmol/L (ref 135–145)
TOTAL PROTEIN: 6.4 g/dL — AB (ref 6.5–8.1)
Total Bilirubin: 0.4 mg/dL (ref 0.3–1.2)

## 2018-10-15 LAB — URINALYSIS, MICROSCOPIC (REFLEX)

## 2018-10-15 MED ORDER — HYDRALAZINE HCL 20 MG/ML IJ SOLN
10.0000 mg | INTRAMUSCULAR | Status: DC | PRN
  Administered 2018-10-15: 10 mg via INTRAVENOUS
  Filled 2018-10-15: qty 1

## 2018-10-15 MED ORDER — NIFEDIPINE ER OSMOTIC RELEASE 30 MG PO TB24
30.0000 mg | ORAL_TABLET | Freq: Every day | ORAL | Status: DC
  Administered 2018-10-16 (×2): 30 mg via ORAL
  Filled 2018-10-15 (×2): qty 1

## 2018-10-15 MED ORDER — BUTALBITAL-APAP-CAFFEINE 50-325-40 MG PO TABS
1.0000 | ORAL_TABLET | ORAL | Status: DC | PRN
  Administered 2018-10-15: 1 via ORAL
  Filled 2018-10-15: qty 1

## 2018-10-15 MED ORDER — LABETALOL HCL 5 MG/ML IV SOLN: 20.0000 mg | INTRAVENOUS | Status: DC | PRN

## 2018-10-15 MED ORDER — HYDRALAZINE HCL 20 MG/ML IJ SOLN
5.0000 mg | INTRAMUSCULAR | Status: DC | PRN
  Administered 2018-10-15: 5 mg via INTRAVENOUS
  Filled 2018-10-15: qty 1

## 2018-10-15 MED ORDER — LABETALOL HCL 5 MG/ML IV SOLN: 40.0000 mg | INTRAVENOUS | Status: DC | PRN

## 2018-10-15 NOTE — MAU Note (Addendum)
PT SAYS  SHE DEL  VAG ON 2-14- CNM.  NO BP PROBLEMS.    D/C HOME ON SUN 2-16.  THEN CAME BACK ON WED 2-19- BC SOB, H/A- BP WAS HIGH-   WAS ADMITTED- - STARTED ON MAG X24 HRS .  THEN D/C ON 2-21-YESTERDAY.      THIS AM - SLIGHT H/A- TOOK IBUPROFEN AT 1030 . THEN AT 4 PM - STILL H/A-  BP  WAS 193/93,  177/102. AT 620PM- 144/89.         THEN H/A WORSE AT 730- BP WAS 215/101.    THEN CAME TO MAU.    BREAST FEEDING

## 2018-10-15 NOTE — H&P (Signed)
Chief Complaint: Hypertension   None    SUBJECTIVE HPI: Theresa Winters is a 33 y.o. [id number] at Unknown who presents to Maternity Admissions reporting headache and an increased BP.  Pt states has been resting all day but when headache started she took her BP.  She states her BP were high with one at 1930 of 215/101.  She is 8 days postpartum from a SVD.   She was readmitted for pre eclampsia with severe features on 2/20 and discharged on 10/14/18.  She was treated with Magnesium Sulfate for 24 hours and had an extensive work up.  She had a negative chest xray, negative CT scan, normal heart echo and normal EKG with bradycardia.  She was seen by cardiology.  She was not sent home on any BP medication.  Location: headache Quality: mild to moderate Severity: 4/10 on pain scale Duration: today Timing: all day Modifying factors: ibuprofen Associated signs and symptoms: Denies SOB or small amount of chest pain.      Past Medical History:  Diagnosis Date  . Anemia    with pregnancy  . Dyspnea   . Medical history non-contributory   . Scoliosis   . Scoliosis                    OB History  Gravida Para Term Preterm AB Living  3 2 2  0 1 2  SAB TAB Ectopic Multiple Live Births     0 1 0 0 2       # Outcome Date GA Lbr Len/2nd Weight Sex Delivery Anes PTL Lv  3 Term 10/07/18 [redacted]w[redacted]d / 18:20 3496 g F Vag-Spont EPI  LIV  2 TAB           1 Term      Vag-Spont           Past Surgical History:  Procedure Laterality Date  . THERAPEUTIC ABORTION     Social History        Socioeconomic History  . Marital status: Single    Spouse name: Not on file  . Number of children: Not on file  . Years of education: Not on file  . Highest education level: Not on file  Occupational History  . Not on file  Social Needs  . Financial resource strain: Not on file  . Food insecurity:    Worry: Not on file    Inability: Not on file  . Transportation needs:     Medical: Not on file    Non-medical: Not on file  Tobacco Use  . Smoking status: Never Smoker  . Smokeless tobacco: Never Used  Substance and Sexual Activity  . Alcohol use: Not Currently    Comment: occ  . Drug use: No  . Sexual activity: Not Currently    Birth control/protection: None  Lifestyle  . Physical activity:    Days per week: Not on file    Minutes per session: Not on file  . Stress: Not on file  Relationships  . Social connections:    Talks on phone: Not on file    Gets together: Not on file    Attends religious service: Not on file    Active member of club or organization: Not on file    Attends meetings of clubs or organizations: Not on file    Relationship status: Not on file  . Intimate partner violence:    Fear of current or ex partner: Not on file    Emotionally  abused: Not on file    Physically abused: Not on file    Forced sexual activity: Not on file  Other Topics Concern  . Not on file  Social History Narrative  . Not on file        Family History  Problem Relation Age of Onset  . Depression Father    No current facility-administered medications on file prior to encounter.          Current Outpatient Medications on File Prior to Encounter  Medication Sig Dispense Refill  . ibuprofen (ADVIL,MOTRIN) 600 MG tablet Take 1 tablet (600 mg total) by mouth every 6 (six) hours. 30 tablet 0  . butalbital-acetaminophen-caffeine (FIORICET, ESGIC) 50-325-40 MG tablet Take 1 tablet by mouth every 4 (four) hours as needed for headache (If unrelieved by Ibuprofen). 14 tablet 0  . prenatal vitamin w/FE, FA (NATACHEW) 29-1 MG CHEW chewable tablet Chew 1 tablet by mouth daily at 12 noon.     No Known Allergies  I have reviewed patient's Past Medical Hx, Surgical Hx, Family Hx, Social Hx, medications and allergies.   Review of Systems  Constitutional: Negative.   HENT: Negative.   Eyes: Negative.   Respiratory: Negative.    Cardiovascular: Positive for chest pain.  Gastrointestinal: Negative.   Endocrine: Negative.   Genitourinary: Negative.   Musculoskeletal: Negative.   Skin: Negative.   Allergic/Immunologic: Negative.   Neurological: Negative.   Hematological: Negative.   Psychiatric/Behavioral: Negative.     OBJECTIVE Patient Vitals for the past 24 hrs:  BP Temp Pulse Resp SpO2 Height Weight  10/15/18 2316 (!) 154/82 - (!) 57 - - - -  10/15/18 2300 (!) 152/89 - 60 - 100 % - -  10/15/18 2246 138/80 - 60 - - - -  10/15/18 2216 (!) 177/91 - (!) 59 - - - -  10/15/18 2201 (!) 201/88 - (!) 47 - - - -  10/15/18 2131 (!) 186/95 - (!) 48 - - - -  10/15/18 2116 (!) 167/96 - (!) 43 - - - -  10/15/18 2105 (!) 166/84 - (!) 45 - - - -  10/15/18 2104 (!) 162/85 - (!) 48 - - - -  10/15/18 2019 (!) 171/98 - (!) 52 - - - -  10/15/18 2016 - 98 F (36.7 C) - 18 - - -  10/15/18 2014 - - - - -  (1.702 m) 73.5 kg   Constitutional: Well-developed, well-nourished female in no acute distress.  Cardiovascular: normal rate Respiratory: normal rate and effort.  GI: Abd soft, non-tender,  MS: Extremities nontender, no edema, normal ROM Neurologic: Alert and oriented x 4.  ZO:[id number] LAB RESULTS LabResultsLast24Hours       Results for orders placed or performed during the hospital encounter of 10/15/18 (from the past 24 hour(s))  Urinalysis, Routine w reflex microscopic     Status: Abnormal   Collection Time: 10/15/18  8:21 PM  Result Value Ref Range   Color, Urine YELLOW YELLOW   APPearance CLEAR CLEAR   Specific Gravity, Urine <1.005 (L) 1.005 - 1.030   pH 6.0 5.0 - 8.0   Glucose, UA NEGATIVE NEGATIVE mg/dL   Hgb urine dipstick MODERATE (A) NEGATIVE   Bilirubin Urine NEGATIVE NEGATIVE   Ketones, ur NEGATIVE NEGATIVE mg/dL   Protein, ur NEGATIVE NEGATIVE mg/dL   Nitrite NEGATIVE NEGATIVE   Leukocytes,Ua SMALL (A) NEGATIVE  Urinalysis, Microscopic (reflex)     Status: Abnormal    Collection Time: 10/15/18  8:21 PM  Result Value Ref Range   RBC / HPF 6-10 0 - 5 RBC/hpf   WBC, UA 0-5 0 - 5 WBC/hpf   Bacteria, UA FEW (A) NONE SEEN   Squamous Epithelial / LPF 0-5 0 - 5  Comprehensive metabolic panel     Status: Abnormal   Collection Time: 10/15/18  9:33 PM  Result Value Ref Range   Sodium 139 135 - 145 mmol/L   Potassium 4.2 3.5 - 5.1 mmol/L   Chloride 109 98 - 111 mmol/L   CO2 22 22 - 32 mmol/L   Glucose, Bld 80 70 - 99 mg/dL   BUN 14 6 - 20 mg/dL   Creatinine, Ser 6.23 0.44 - 1.00 mg/dL   Calcium 8.4 (L) 8.9 - 10.3 mg/dL   Total Protein 6.4 (L) 6.5 - 8.1 g/dL   Albumin 3.2 (L) 3.5 - 5.0 g/dL   AST 20 15 - 41 U/L   ALT 46 (H) 0 - 44 U/L   Alkaline Phosphatase 115 38 - 126 U/L   Total Bilirubin 0.4 0.3 - 1.2 mg/dL   GFR calc non Af Amer >60 >60 mL/min   GFR calc Af Amer >60 >60 mL/min   Anion gap 8 5 - 15  CBC with Differential/Platelet     Status: None   Collection Time: 10/15/18  9:33 PM  Result Value Ref Range   WBC 10.3 4.0 - 10.5 K/uL   RBC 4.61 3.87 - 5.11 MIL/uL   Hemoglobin 13.1 12.0 - 15.0 g/dL   HCT 76.2 83.1 - 51.7 %   MCV 87.4 80.0 - 100.0 fL   MCH 28.4 26.0 - 34.0 pg   MCHC 32.5 30.0 - 36.0 g/dL   RDW 61.6 07.3 - 71.0 %   Platelets 231 150 - 400 K/uL   nRBC 0.0 0.0 - 0.2 %   Neutrophils Relative % 65 %   Neutro Abs 6.7 1.7 - 7.7 K/uL   Lymphocytes Relative 28 %   Lymphs Abs 2.9 0.7 - 4.0 K/uL   Monocytes Relative 5 %   Monocytes Absolute 0.5 0.1 - 1.0 K/uL   Eosinophils Relative 2 %   Eosinophils Absolute 0.2 0.0 - 0.5 K/uL   Basophils Relative 0 %   Basophils Absolute 0.0 0.0 - 0.1 K/uL      IMAGING  MAU COURSE    Orders Placed This Encounter  Procedures  . Urinalysis, Routine w reflex microscopic  . Urinalysis, Microscopic (reflex)  . Comprehensive metabolic panel  . CBC with Differential/Platelet  . Diet regular Room service appropriate? Yes; Fluid consistency: Thin  .  Notify Physician  . Measure blood pressure  . Activity as tolerated  . Vital signs  . Place in observation (patient's expected length of stay will be less than 2 midnights)       Meds ordered this encounter  Medications  . AND Linked Order Group   . hydrALAZINE (APRESOLINE) injection 5 mg   . hydrALAZINE (APRESOLINE) injection 10 mg   . labetalol (NORMODYNE,TRANDATE) injection 20 mg   . labetalol (NORMODYNE,TRANDATE) injection 40 mg  . butalbital-acetaminophen-caffeine (FIORICET, ESGIC) 50-325-40 MG per tablet 1 tablet  . NIFEdipine (PROCARDIA-XL/NIFEDICAL-XL) 24 hr tablet 30 mg    MDM Lab reviewed and stable, BP decreased with IV apresoline and pulse increased to normal level. Fioricet given for headache.  Procardia XL 30 mg given. Discussed plano of care with Dr. Normand Sloop.  Will observe overnight. ASSESSMENT Postpartum pre eclampsia with continued hypertension  PLAN POC reviewed with MD  Will observe overnight and start Procardia XL 30 mg.    Kenney Houseman, CNM 10/15/2018  11:46 PM

## 2018-10-15 NOTE — MAU Provider Note (Signed)
Chief Complaint: Hypertension   None    SUBJECTIVE HPI: Theresa Winters is a 33 y.o. [id number] at Unknown who presents to Maternity Admissions reporting headache and an increased BP.  Pt states has been resting all day but when headache started she took her BP.  She states her BP were high with one at 1930 of 215/101.  She is 8 days postpartum from a SVD.   She was readmitted for pre eclampsia with severe features on 2/20 and discharged on 10/14/18.  She was treated with Magnesium Sulfate for 24 hours and had an extensive work up.  She had a negative chest xray, negative CT scan, normal heart echo and normal EKG with bradycardia.  She was seen by cardiology.  She was not sent home on any BP medication.  Location: headache Quality: mild to moderate Severity: 4/10 on pain scale Duration: today Timing: all day Modifying factors: ibuprofen Associated signs and symptoms: Denies SOB or small amount of chest pain.  Past Medical History:  Diagnosis Date  . Anemia    with pregnancy  . Dyspnea   . Medical history non-contributory   . Scoliosis   . Scoliosis    OB History  Gravida Para Term Preterm AB Living  3 2 2  0 1 2  SAB TAB Ectopic Multiple Live Births  0 1 0 0 2    # Outcome Date GA Lbr Len/2nd Weight Sex Delivery Anes PTL Lv  3 Term 10/07/18 [redacted]w[redacted]d / 18:20 3496 g F Vag-Spont EPI  LIV  2 TAB           1 Term      Vag-Spont      Past Surgical History:  Procedure Laterality Date  . THERAPEUTIC ABORTION     Social History   Socioeconomic History  . Marital status: Single    Spouse name: Not on file  . Number of children: Not on file  . Years of education: Not on file  . Highest education level: Not on file  Occupational History  . Not on file  Social Needs  . Financial resource strain: Not on file  . Food insecurity:    Worry: Not on file    Inability: Not on file  . Transportation needs:    Medical: Not on file    Non-medical: Not on file  Tobacco Use  . Smoking  status: Never Smoker  . Smokeless tobacco: Never Used  Substance and Sexual Activity  . Alcohol use: Not Currently    Comment: occ  . Drug use: No  . Sexual activity: Not Currently    Birth control/protection: None  Lifestyle  . Physical activity:    Days per week: Not on file    Minutes per session: Not on file  . Stress: Not on file  Relationships  . Social connections:    Talks on phone: Not on file    Gets together: Not on file    Attends religious service: Not on file    Active member of club or organization: Not on file    Attends meetings of clubs or organizations: Not on file    Relationship status: Not on file  . Intimate partner violence:    Fear of current or ex partner: Not on file    Emotionally abused: Not on file    Physically abused: Not on file    Forced sexual activity: Not on file  Other Topics Concern  . Not on file  Social History Narrative  .  Not on file   Family History  Problem Relation Age of Onset  . Depression Father    No current facility-administered medications on file prior to encounter.    Current Outpatient Medications on File Prior to Encounter  Medication Sig Dispense Refill  . ibuprofen (ADVIL,MOTRIN) 600 MG tablet Take 1 tablet (600 mg total) by mouth every 6 (six) hours. 30 tablet 0  . butalbital-acetaminophen-caffeine (FIORICET, ESGIC) 50-325-40 MG tablet Take 1 tablet by mouth every 4 (four) hours as needed for headache (If unrelieved by Ibuprofen). 14 tablet 0  . prenatal vitamin w/FE, FA (NATACHEW) 29-1 MG CHEW chewable tablet Chew 1 tablet by mouth daily at 12 noon.     No Known Allergies  I have reviewed patient's Past Medical Hx, Surgical Hx, Family Hx, Social Hx, medications and allergies.   Review of Systems  Constitutional: Negative.   HENT: Negative.   Eyes: Negative.   Respiratory: Negative.   Cardiovascular: Positive for chest pain.  Gastrointestinal: Negative.   Endocrine: Negative.   Genitourinary: Negative.    Musculoskeletal: Negative.   Skin: Negative.   Allergic/Immunologic: Negative.   Neurological: Negative.   Hematological: Negative.   Psychiatric/Behavioral: Negative.     OBJECTIVE Patient Vitals for the past 24 hrs:  BP Temp Pulse Resp SpO2 Height Weight  10/15/18 2316 (!) 154/82 - (!) 57 - - - -  10/15/18 2300 (!) 152/89 - 60 - 100 % - -  10/15/18 2246 138/80 - 60 - - - -  10/15/18 2216 (!) 177/91 - (!) 59 - - - -  10/15/18 2201 (!) 201/88 - (!) 47 - - - -  10/15/18 2131 (!) 186/95 - (!) 48 - - - -  10/15/18 2116 (!) 167/96 - (!) 43 - - - -  10/15/18 2105 (!) 166/84 - (!) 45 - - - -  10/15/18 2104 (!) 162/85 - (!) 48 - - - -  10/15/18 2019 (!) 171/98 - (!) 52 - - - -  10/15/18 2016 - 98 F (36.7 C) - 18 - - -  10/15/18 2014 - - - - -  (1.702 m) 73.5 kg   Constitutional: Well-developed, well-nourished female in no acute distress.  Cardiovascular: normal rate Respiratory: normal rate and effort.  GI: Abd soft, non-tender,  MS: Extremities nontender, no edema, normal ROM Neurologic: Alert and oriented x 4.  UE:[id number] LAB RESULTS Results for orders placed or performed during the hospital encounter of 10/15/18 (from the past 24 hour(s))  Urinalysis, Routine w reflex microscopic     Status: Abnormal   Collection Time: 10/15/18  8:21 PM  Result Value Ref Range   Color, Urine YELLOW YELLOW   APPearance CLEAR CLEAR   Specific Gravity, Urine <1.005 (L) 1.005 - 1.030   pH 6.0 5.0 - 8.0   Glucose, UA NEGATIVE NEGATIVE mg/dL   Hgb urine dipstick MODERATE (A) NEGATIVE   Bilirubin Urine NEGATIVE NEGATIVE   Ketones, ur NEGATIVE NEGATIVE mg/dL   Protein, ur NEGATIVE NEGATIVE mg/dL   Nitrite NEGATIVE NEGATIVE   Leukocytes,Ua SMALL (A) NEGATIVE  Urinalysis, Microscopic (reflex)     Status: Abnormal   Collection Time: 10/15/18  8:21 PM  Result Value Ref Range   RBC / HPF 6-10 0 - 5 RBC/hpf   WBC, UA 0-5 0 - 5 WBC/hpf   Bacteria, UA FEW (A) NONE SEEN   Squamous Epithelial  / LPF 0-5 0 - 5  Comprehensive metabolic panel     Status: Abnormal   Collection  Time: 10/15/18  9:33 PM  Result Value Ref Range   Sodium 139 135 - 145 mmol/L   Potassium 4.2 3.5 - 5.1 mmol/L   Chloride 109 98 - 111 mmol/L   CO2 22 22 - 32 mmol/L   Glucose, Bld 80 70 - 99 mg/dL   BUN 14 6 - 20 mg/dL   Creatinine, Ser 2.83 0.44 - 1.00 mg/dL   Calcium 8.4 (L) 8.9 - 10.3 mg/dL   Total Protein 6.4 (L) 6.5 - 8.1 g/dL   Albumin 3.2 (L) 3.5 - 5.0 g/dL   AST 20 15 - 41 U/L   ALT 46 (H) 0 - 44 U/L   Alkaline Phosphatase 115 38 - 126 U/L   Total Bilirubin 0.4 0.3 - 1.2 mg/dL   GFR calc non Af Amer >60 >60 mL/min   GFR calc Af Amer >60 >60 mL/min   Anion gap 8 5 - 15  CBC with Differential/Platelet     Status: None   Collection Time: 10/15/18  9:33 PM  Result Value Ref Range   WBC 10.3 4.0 - 10.5 K/uL   RBC 4.61 3.87 - 5.11 MIL/uL   Hemoglobin 13.1 12.0 - 15.0 g/dL   HCT 15.1 76.1 - 60.7 %   MCV 87.4 80.0 - 100.0 fL   MCH 28.4 26.0 - 34.0 pg   MCHC 32.5 30.0 - 36.0 g/dL   RDW 37.1 06.2 - 69.4 %   Platelets 231 150 - 400 K/uL   nRBC 0.0 0.0 - 0.2 %   Neutrophils Relative % 65 %   Neutro Abs 6.7 1.7 - 7.7 K/uL   Lymphocytes Relative 28 %   Lymphs Abs 2.9 0.7 - 4.0 K/uL   Monocytes Relative 5 %   Monocytes Absolute 0.5 0.1 - 1.0 K/uL   Eosinophils Relative 2 %   Eosinophils Absolute 0.2 0.0 - 0.5 K/uL   Basophils Relative 0 %   Basophils Absolute 0.0 0.0 - 0.1 K/uL    IMAGING  MAU COURSE Orders Placed This Encounter  Procedures  . Urinalysis, Routine w reflex microscopic  . Urinalysis, Microscopic (reflex)  . Comprehensive metabolic panel  . CBC with Differential/Platelet  . Diet regular Room service appropriate? Yes; Fluid consistency: Thin  . Notify Physician  . Measure blood pressure  . Activity as tolerated  . Vital signs  . Place in observation (patient's expected length of stay will be less than 2 midnights)   Meds ordered this encounter  Medications  . AND  Linked Order Group   . hydrALAZINE (APRESOLINE) injection 5 mg   . hydrALAZINE (APRESOLINE) injection 10 mg   . labetalol (NORMODYNE,TRANDATE) injection 20 mg   . labetalol (NORMODYNE,TRANDATE) injection 40 mg  . butalbital-acetaminophen-caffeine (FIORICET, ESGIC) 50-325-40 MG per tablet 1 tablet  . NIFEdipine (PROCARDIA-XL/NIFEDICAL-XL) 24 hr tablet 30 mg    MDM Lab reviewed and stable, BP decreased with IV apresoline and pulse increased to normal level. Fioricet given for headache.  Procardia XL 30 mg given. Discussed plano of care with Dr. Normand Sloop.  Will observe overnight. ASSESSMENT Postpartum pre eclampsia with continued hypertension  PLAN POC reviewed with MD Will observe overnight and start Procardia XL 30 mg.     Kenney Houseman, CNM 10/15/2018  11:46 PM

## 2018-10-15 NOTE — MAU Note (Signed)
SVD 10/07/18. Readmitted Weds with HTN. That was first issues with high b/p. Was on Mag when readmitted. Home Friday. Today just not feeling well with slight h/a and pain in L upper chest. Took B/Ps and all elevated 193/93, 177/102, 144/89 and 215/101.

## 2018-10-16 ENCOUNTER — Other Ambulatory Visit: Payer: Self-pay

## 2018-10-16 ENCOUNTER — Encounter (HOSPITAL_COMMUNITY): Payer: Self-pay | Admitting: *Deleted

## 2018-10-16 MED ORDER — LACTATED RINGERS IV SOLN
INTRAVENOUS | Status: DC
  Administered 2018-10-15 – 2018-10-16 (×2): via INTRAVENOUS

## 2018-10-16 MED ORDER — NIFEDIPINE ER 30 MG PO TB24: 30.0000 mg | ORAL_TABLET | Freq: Every day | ORAL | 2 refills | Status: DC

## 2018-10-16 NOTE — Progress Notes (Signed)
Pt ambulated out teaching complete  

## 2018-10-16 NOTE — Progress Notes (Signed)
S:Pt feeling better. Reported pain medication worked for headache.  Got some sleep last night. O; Vitals:   10/16/18 0413 10/16/18 0547  BP: 134/76 130/72  Pulse: 79 (!) 54  Resp: 18 18  Temp: 97.8 F (36.6 C) 98.1 F (36.7 C)  SpO2: 100%    A:postpartum pre eclampsia with severe features treated with hypertension P: Continue with Procardia XL 30mg  Will plan on d/c today if stable.

## 2018-10-16 NOTE — Discharge Summary (Signed)
    OB Discharge Summary     Patient Name: Theresa Winters DOB: [birth year] MRN: [id number]  Date of admission: 10/15/2018 Delivering MD: This patient has no babies on file.  Date of discharge: 10/16/2018  Admitting diagnosis: high blood pressure Intrauterine pregnancy: Unknown     Secondary diagnosis:  Active Problems:   Hypertension in pregnancy, pre-eclampsia, severe, delivered/postpartum  Additional problems: pre eclampsia with severe features treated     Discharge diagnosis: postpartum hypertension                                                                                   Post partum procedures:None     Hospital course:  Admitted for observation for increased blood pressure.  Apresoline IV push x2 Labs negative.  Procardia 30 mg xl given with BP stabilized.  Fioricet for headache  Physical exam  Vitals:   10/16/18 0001 10/16/18 0041 10/16/18 0413 10/16/18 0547  BP: 138/86 (!) 154/84 134/76 130/72  Pulse: 66 (!) 52 79 (!) 54  Resp:  18 18 18   Temp:  97.9 F (36.6 C) 97.8 F (36.6 C) 98.1 F (36.7 C)  TempSrc:  Oral Oral Oral  SpO2:  100% 100%   Weight:      Height:       General: alert, cooperative and no distress Lochia: appropriate Uterine Fundus: firm  DVT Evaluation: No evidence of DVT seen on physical exam. Labs: Lab Results  Component Value Date   WBC 10.3 10/15/2018   HGB 13.1 10/15/2018   HCT 40.3 10/15/2018   MCV 87.4 10/15/2018   PLT 231 10/15/2018   CMP Latest Ref Rng & Units 10/15/2018  Glucose 70 - 99 mg/dL 80  BUN 6 - 20 mg/dL 14  Creatinine 0.38 - 3.33 mg/dL 8.32  Sodium 919 - 166 mmol/L 139  Potassium 3.5 - 5.1 mmol/L 4.2  Chloride 98 - 111 mmol/L 109  CO2 22 - 32 mmol/L 22  Calcium 8.9 - 10.3 mg/dL 0.6(Y)  Total Protein 6.5 - 8.1 g/dL 6.4(L)  Total Bilirubin 0.3 - 1.2 mg/dL 0.4  Alkaline Phos 38 - 126 U/L 115  AST 15 - 41 U/L 20  ALT 0 - 44 U/L 46(H)    Discharge instruction: per After Visit Summary and "Baby and Me  Booklet".  After visit meds:  Procardia 30 mg XL  Diet: routine diet  Activity: Advance as tolerated. Pelvic rest for 6 weeks.   Outpatient follow up:4 days Follow up Appt:No future appointments. Follow up Visit:No follow-ups on file.  Postpartum contraception: Not Discussed  Disposition:Stable   10/16/2018 Kenney Houseman, CNM

## 2019-03-31 IMAGING — CT CT ANGIO CHEST
1 of 4 series · 19 of 31 positions shown · IV contrast (OMNIPAQUE)
Comparison: Two-view chest x-ray 10/12/2018.  CTA chest 09/24/2017.

CLINICAL DATA: Shortness of breath.  Postpartum 5 days.

EXAM:
CT ANGIOGRAPHY CHEST WITH CONTRAST
TECHNIQUE: Multidetector CT imaging of the chest was performed using the
standard protocol during bolus administration of intravenous
contrast. Multiplanar CT image reconstructions and MIPs were
obtained to evaluate the vascular anatomy.
CONTRAST:  100mL B4G4WV-8W1 IOPAMIDOL (B4G4WV-8W1) INJECTION 76%

[Series 6: (person_name) thins · axial · 0.59mm/px · z∈[+841,+1120]mm · 19 of 447 slices shown]
[im 24/447  lung]
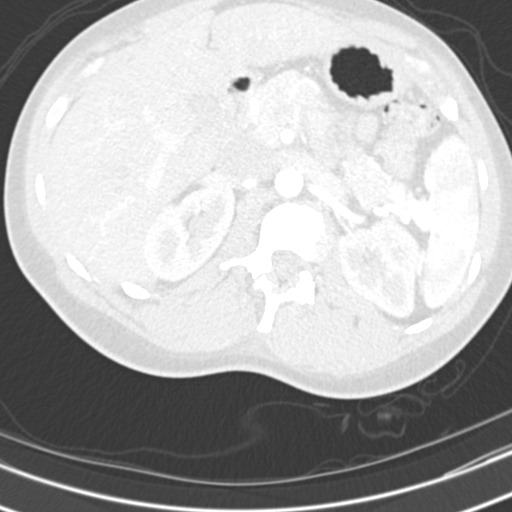
[im 47/447  mediastinal]
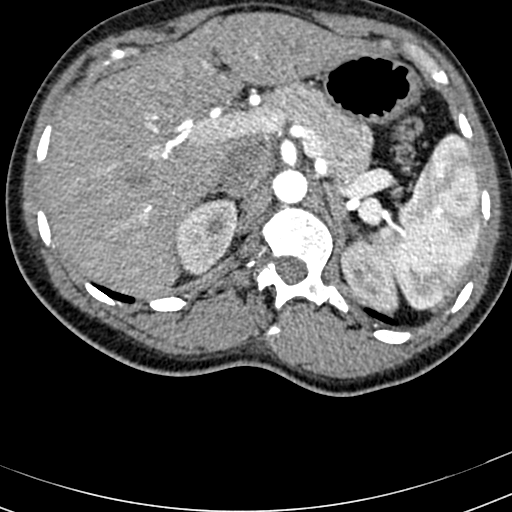
[im 71/447  lung]
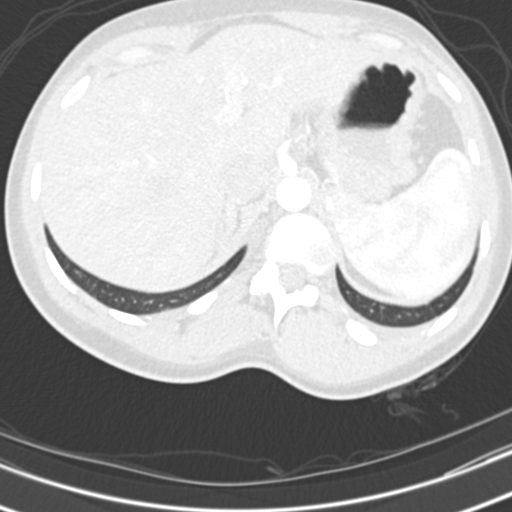
[im 94/447  mediastinal]
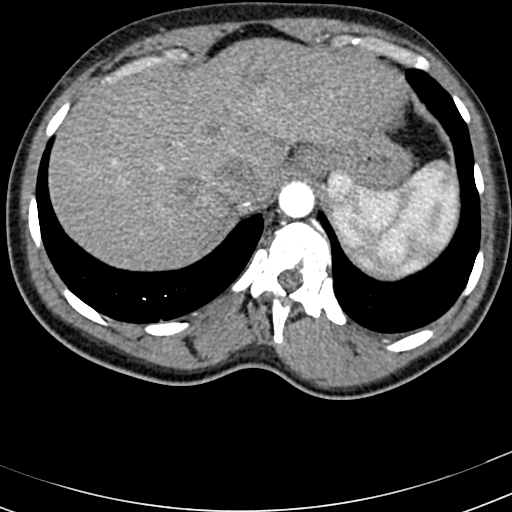
[im 118/447  lung]
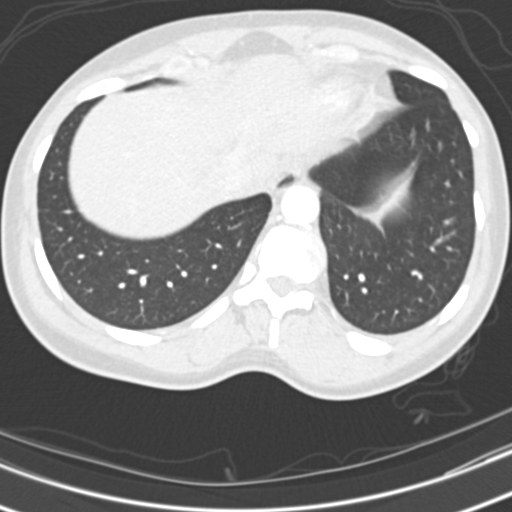
[im 141/447  mediastinal]
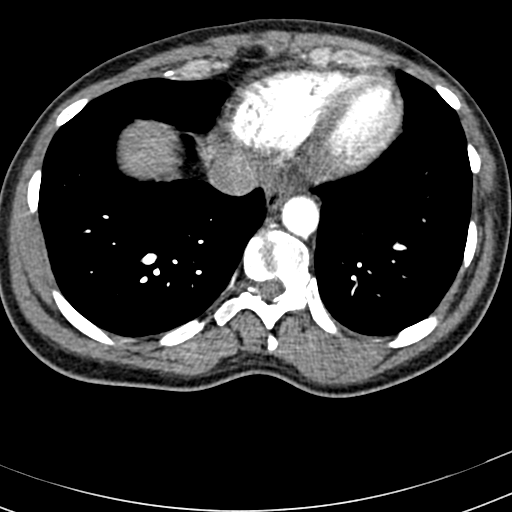
[im 165/447  lung]
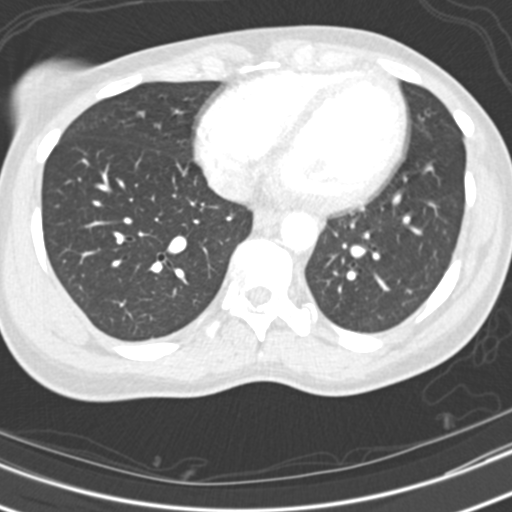
[im 188/447  mediastinal]
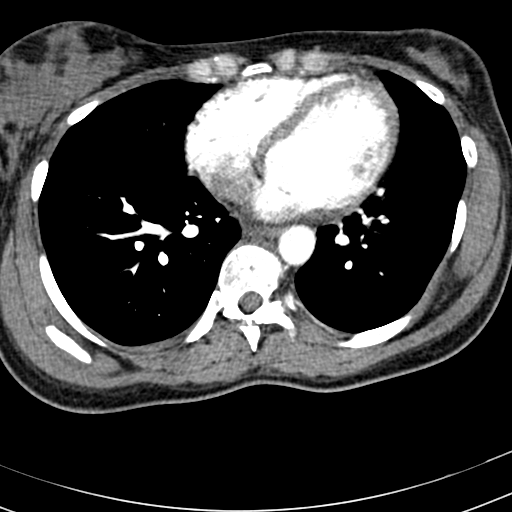
[im 210/447  lung]
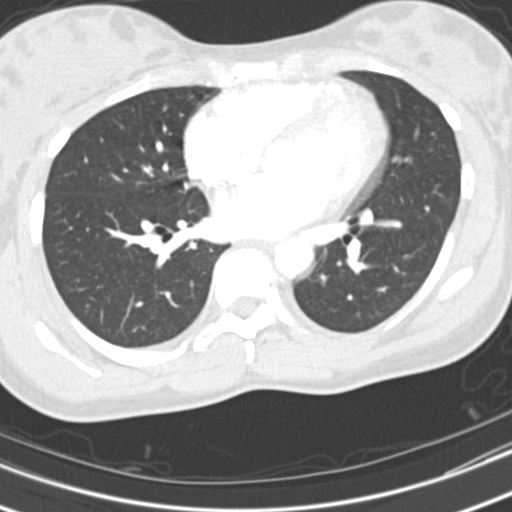
[im 224/447  mediastinal]
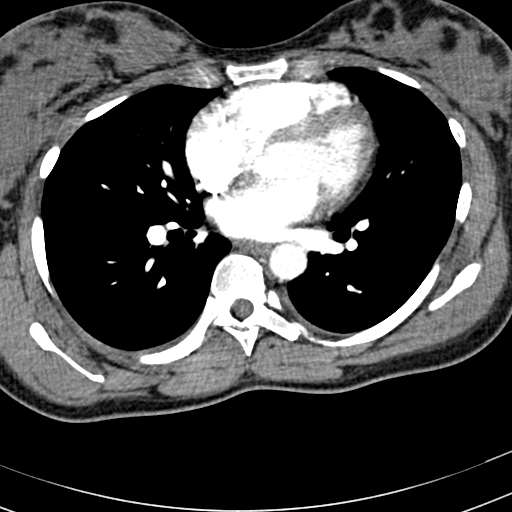
[im 235/447  lung]
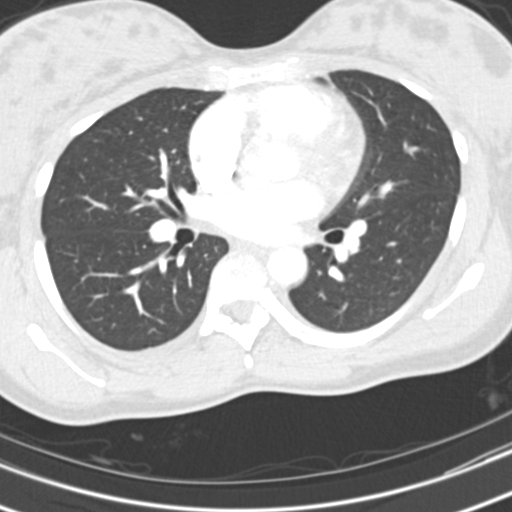
[im 259/447  mediastinal]
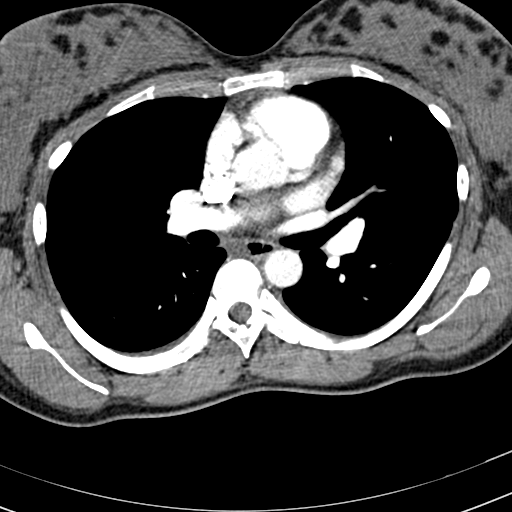
[im 282/447  lung]
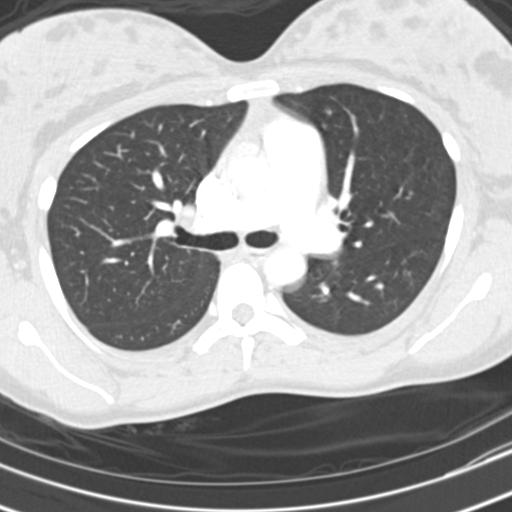
[im 306/447  mediastinal]
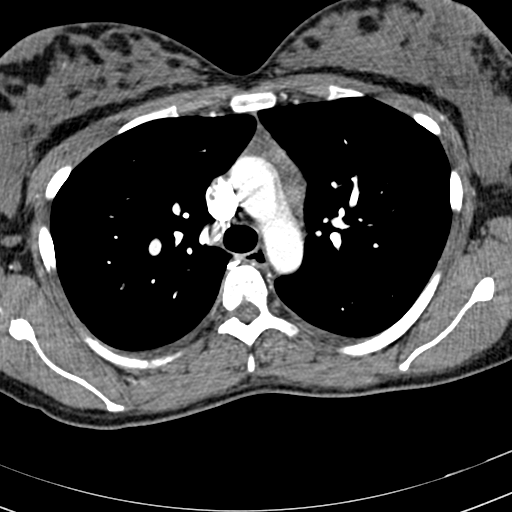
[im 329/447  lung]
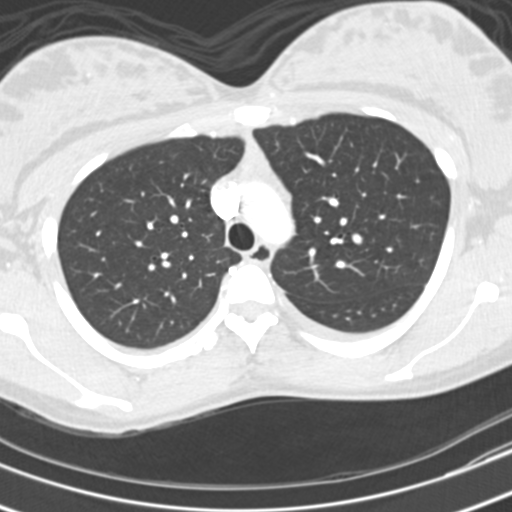
[im 353/447  mediastinal]
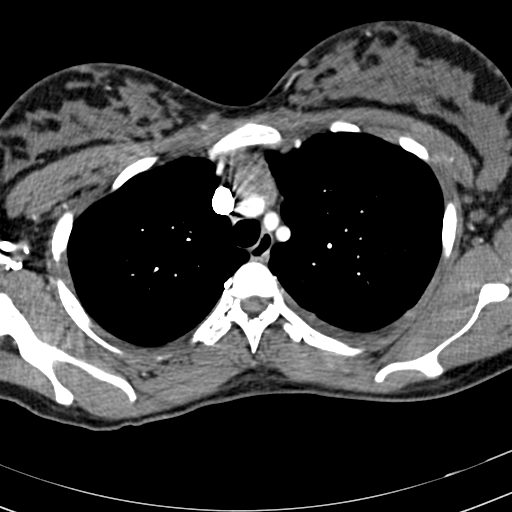
[im 376/447  lung]
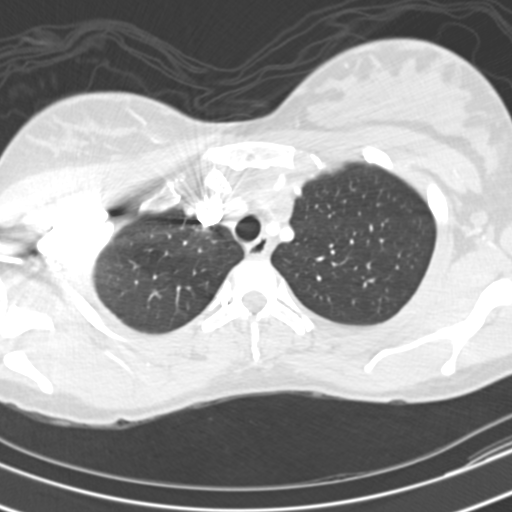
[im 400/447  mediastinal]
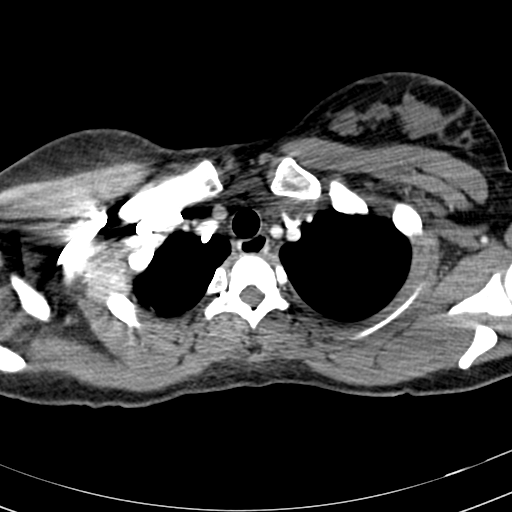
[im 423/447  lung]
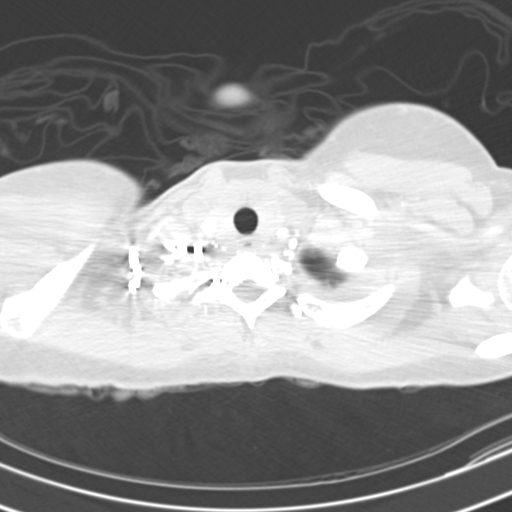

[19 of 31 positions shown; findings below may reference images not displayed]

FINDINGS: Cardiovascular: The heart size is normal. Aorta is within normal
limits. Pulmonary artery opacification is excellent. No focal
filling defects are present to suggest pulmonary emboli. Pulmonary
artery size is normal.

Mediastinum/Nodes: No significant mediastinal, hilar, or axillary
adenopathy is present. The esophagus is normal. Thoracic inlet is
within normal limits.

Lungs/Pleura: The lungs are clear. No focal nodule, mass, or
airspace disease present. There is no edema or effusion.

Upper Abdomen: The upper abdomen is within normal limits.

Musculoskeletal: Scoliosis is again noted. Vertebral body heights
are maintained. No focal lytic or blastic lesions are present.

Review of the MIP images confirms the above findings.
IMPRESSION: 1. No pulmonary embolus.
2. No acute or focal abnormality to explain chest pain or shortness
of breath.
3. Scoliosis.

## 2019-04-01 ENCOUNTER — Emergency Department (HOSPITAL_BASED_OUTPATIENT_CLINIC_OR_DEPARTMENT_OTHER)
Admission: EM | Admit: 2019-04-01 | Discharge: 2019-04-01 | Disposition: A | Discharge status: Home / Self Care | Payer: Medicaid Other | Attending: Emergency Medicine | Admitting: Emergency Medicine

## 2019-04-01 ENCOUNTER — Other Ambulatory Visit: Payer: Self-pay

## 2019-04-01 ENCOUNTER — Encounter (HOSPITAL_BASED_OUTPATIENT_CLINIC_OR_DEPARTMENT_OTHER): Payer: Self-pay | Admitting: Adult Health

## 2019-04-01 DIAGNOSIS — T63304A Toxic effect of unspecified spider venom, undetermined, initial encounter: Secondary | ICD-10-CM

## 2019-04-01 MED ORDER — DIPHENHYDRAMINE HCL 25 MG PO CAPS
25.0000 mg | ORAL_CAPSULE | Freq: Once | ORAL | Status: DC
  Filled 2019-04-01: qty 1

## 2019-04-01 NOTE — ED Provider Notes (Signed)
[facility] EMERGENCY DEPARTMENT Provider Note   CSN: [id number] Arrival date & time: 04/01/19  2136     History   Chief Complaint Chief Complaint  Patient presents with  . Insect Bite    HPI Theresa Winters is a 33 y.o. female.  She is presenting to the emergency department complaining of pain and swelling of her right forearm after a spider bit her her about 30 minutes prior to arrival.  She said it was small and black and she hit and killed it but she was worried that it might be a serious spider bite.  She is complaining of some mild pain and itching at the site.  No fevers or chills.  She said she was in her house cleaning up when it happened.     The history is provided by the patient.  Animal Bite Contact animal:  Insect Location:  Shoulder/arm Shoulder/arm injury location:  R forearm Time since incident:  30 minutes Pain details:    Quality:  Itching and localized   Severity:  Mild   Timing:  Constant   Progression:  Unchanged Incident location:  Home Relieved by:  None tried Worsened by:  Nothing Ineffective treatments:  None tried Associated symptoms: rash and swelling   Associated symptoms: no fever and no numbness     Past Medical History:  Diagnosis Date  . Anemia    with pregnancy  . Dyspnea   . Medical history non-contributory   . Scoliosis   . Scoliosis     Patient Active Problem List   Diagnosis Date Noted  . Preeclampsia, severe 10/14/2018  . Hypertension in pregnancy, pre-eclampsia, severe, delivered/postpartum 10/13/2018  . Vaginal delivery 10/09/2018  . Vaginal bleeding in pregnancy, third trimester 10/04/2018  . Scoliosis deformity of spine 08/22/2018  . Anemia of pregnancy 06/28/2018  . Pregnant 02/28/2018  . Palpitations 01/06/2018  . Panic attacks 01/06/2018    Past Surgical History:  Procedure Laterality Date  . THERAPEUTIC ABORTION       OB History    Gravida  3   Para  2   Term  2   Preterm  0   AB  1    Living  2     SAB  0   TAB  1   Ectopic  0   Multiple  0   Live Births  2            Home Medications    Prior to Admission medications   Medication Sig Start Date End Date Taking? Authorizing Provider  butalbital-acetaminophen-caffeine (FIORICET, ESGIC) 50-325-40 MG tablet Take 1 tablet by mouth every 4 (four) hours as needed for headache (If unrelieved by Ibuprofen). 10/14/18   Jaymes Graffillard, Naima, MD  NIFEdipine (ADALAT CC) 30 MG 24 hr tablet Take 1 tablet (30 mg total) by mouth daily. 10/16/18   Prothero, Henderson NewcomerNancy Jean, CNM  prenatal vitamin w/FE, FA (NATACHEW) 29-1 MG CHEW chewable tablet Chew 1 tablet by mouth daily at 12 noon.    [provider]    Family History Family History  Problem Relation Age of Onset  . Depression Father     Social History Social History   Tobacco Use  . Smoking status: Never Smoker  . Smokeless tobacco: Never Used  Substance Use Topics  . Alcohol use: Not Currently    Comment: occ  . Drug use: No     Allergies   Patient has no known allergies.   Review of Systems Review of  Systems  Constitutional: Negative for fever.  HENT: Negative for sore throat.   Respiratory: Negative for shortness of breath.   Cardiovascular: Negative for chest pain.  Gastrointestinal: Negative for abdominal pain.  Genitourinary: Negative for dysuria.  Skin: Positive for rash.  Neurological: Negative for numbness.     Physical Exam Updated Vital Signs BP 138/81   Pulse 70   Temp 98.2 F (36.8 C) (Oral)   Resp 18   Ht 5\' 7"  (1.702 m)   Wt 62.6 kg   SpO2 100%   BMI 21.61 kg/m   Physical Exam Constitutional:      Appearance: She is well-developed.  HENT:     Head: Normocephalic and atraumatic.  Eyes:     Conjunctiva/sclera: Conjunctivae normal.  Neck:     Musculoskeletal: Neck supple.  Musculoskeletal:        General: Tenderness and signs of injury present.     Comments: She has approximately a 2 cm area circular over her  right forearm that is got a little bit of raised induration.  Do not see any obvious open wounds.  There is a little bit of surrounding erythema.  Skin:    General: Skin is warm and dry.     Capillary Refill: Capillary refill takes less than 2 seconds.  Neurological:     General: No focal deficit present.     Mental Status: She is alert and oriented to person, place, and time.     GCS: GCS eye subscore is 4. GCS verbal subscore is 5. GCS motor subscore is 6.      ED Treatments / Results  Labs (all labs ordered are listed, but only abnormal results are displayed) Labs Reviewed - No data to display  EKG None  Radiology No results found.  Procedures Procedures (including critical care time)  Medications Ordered in ED Medications  diphenhydrAMINE (BENADRYL) capsule 25 mg (has no administration in time range)     Initial Impression / Assessment and Plan / ED Course  I have reviewed the triage vital signs and the nursing notes.  Pertinent labs & imaging results that were available during my care of the patient were reviewed by me and considered in my medical decision making (see chart for details).         Final Clinical Impressions(s) / ED Diagnoses   Final diagnoses:  Spider bite wound, undetermined intent, initial encounter    ED Discharge Orders    None       Hayden Rasmussen, MD 04/02/19 1022

## 2019-04-01 NOTE — ED Triage Notes (Signed)
Approximately 30 minutes ago pt reports that a spider bit the posterior side of her right arm. Complains of itching.

## 2019-04-01 NOTE — Discharge Instructions (Addendum)
You were in the emergency department for evaluation of a spider bite to your right forearm.  There was no evidence that this was a serious bite but you will need to keep an eye on the area.  You can use topical hydrocortisone cream and take Benadryl as needed for itching.  Please return if any worsening symptoms.

## 2019-11-30 ENCOUNTER — Other Ambulatory Visit: Payer: Self-pay | Admitting: Obstetrics & Gynecology

## 2020-02-14 ENCOUNTER — Emergency Department (HOSPITAL_BASED_OUTPATIENT_CLINIC_OR_DEPARTMENT_OTHER)
Admission: EM | Admit: 2020-02-14 | Discharge: 2020-02-14 | Disposition: A | Discharge status: Home / Self Care | Payer: Medicaid Other | Attending: Emergency Medicine | Admitting: Emergency Medicine

## 2020-02-14 ENCOUNTER — Emergency Department (HOSPITAL_BASED_OUTPATIENT_CLINIC_OR_DEPARTMENT_OTHER): Payer: Medicaid Other

## 2020-02-14 ENCOUNTER — Encounter (HOSPITAL_BASED_OUTPATIENT_CLINIC_OR_DEPARTMENT_OTHER): Payer: Self-pay | Admitting: *Deleted

## 2020-02-14 ENCOUNTER — Other Ambulatory Visit: Payer: Self-pay

## 2020-02-14 DIAGNOSIS — O139 Gestational [pregnancy-induced] hypertension without significant proteinuria, unspecified trimester: Secondary | ICD-10-CM | POA: Insufficient documentation

## 2020-02-14 DIAGNOSIS — Z3A01 Less than 8 weeks gestation of pregnancy: Secondary | ICD-10-CM | POA: Insufficient documentation

## 2020-02-14 DIAGNOSIS — O209 Hemorrhage in early pregnancy, unspecified: Secondary | ICD-10-CM | POA: Diagnosis not present

## 2020-02-14 DIAGNOSIS — Z79899 Other long term (current) drug therapy: Secondary | ICD-10-CM | POA: Insufficient documentation

## 2020-02-14 LAB — CBC WITH DIFFERENTIAL/PLATELET
Abs Immature Granulocytes: 0.01 10*3/uL (ref 0.00–0.07)
Basophils Absolute: 0.1 10*3/uL (ref 0.0–0.1)
Basophils Relative: 1 %
Eosinophils Absolute: 0.2 10*3/uL (ref 0.0–0.5)
Eosinophils Relative: 3 %
HCT: 41 % (ref 36.0–46.0)
Hemoglobin: 13.5 g/dL (ref 12.0–15.0)
Immature Granulocytes: 0 %
Lymphocytes Relative: 27 %
Lymphs Abs: 2 10*3/uL (ref 0.7–4.0)
MCH: 29.2 pg (ref 26.0–34.0)
MCHC: 32.9 g/dL (ref 30.0–36.0)
MCV: 88.7 fL (ref 80.0–100.0)
Monocytes Absolute: 0.6 10*3/uL (ref 0.1–1.0)
Monocytes Relative: 8 %
Neutro Abs: 4.5 10*3/uL (ref 1.7–7.7)
Neutrophils Relative %: 61 %
Platelets: 215 10*3/uL (ref 150–400)
RBC: 4.62 MIL/uL (ref 3.87–5.11)
RDW: 13.2 % (ref 11.5–15.5)
WBC: 7.5 10*3/uL (ref 4.0–10.5)
nRBC: 0 % (ref 0.0–0.2)

## 2020-02-14 LAB — COMPREHENSIVE METABOLIC PANEL
ALT: 14 U/L (ref 0–44)
AST: 16 U/L (ref 15–41)
Albumin: 4.3 g/dL (ref 3.5–5.0)
Alkaline Phosphatase: 53 U/L (ref 38–126)
Anion gap: 9 (ref 5–15)
BUN: 17 mg/dL (ref 6–20)
CO2: 23 mmol/L (ref 22–32)
Calcium: 8.5 mg/dL — ABNORMAL LOW (ref 8.9–10.3)
Chloride: 103 mmol/L (ref 98–111)
Creatinine, Ser: 0.74 mg/dL (ref 0.44–1.00)
GFR calc Af Amer: >60 mL/min (ref >60)
GFR calc non Af Amer: >60 mL/min (ref >60)
Glucose, Bld: 104 mg/dL — ABNORMAL HIGH (ref 70–99)
Potassium: 3.8 mmol/L (ref 3.5–5.1)
Sodium: 135 mmol/L (ref 135–145)
Total Bilirubin: 0.3 mg/dL (ref 0.3–1.2)
Total Protein: 7.2 g/dL (ref 6.5–8.1)

## 2020-02-14 LAB — HCG, QUANTITATIVE, PREGNANCY: hCG, Beta Chain, Quant, S: 4161 m[IU]/mL — ABNORMAL HIGH (ref <5)

## 2020-02-14 NOTE — ED Provider Notes (Signed)
[facility] EMERGENCY DEPARTMENT Provider Note   CSN: [id number] Arrival date & time: 02/14/20  1700     History Chief Complaint  Patient presents with  . Vaginal Bleeding    Ronasia Isola is a 34 y.o. female G5, P4 here presenting with vaginal spotting.  Patient states that she is about [redacted] weeks pregnant.  She missed her period so went to see [facility] yesterday.  She states that she had an ultrasound done yesterday that showed possible yolk sac and left ovarian corpus luteum cyst.  She also had a wet prep that is positive for BV and was prescribed MetroGel.  Patient states that this morning she noticed some blood when she wipes.  She has some abdominal cramps as well.  She states that she does not remember her blood count but never received RhoGam in the past.  The history is provided by the patient.       Past Medical History:  Diagnosis Date  . Anemia    with pregnancy  . Dyspnea   . Medical history non-contributory   . Scoliosis   . Scoliosis     Patient Active Problem List   Diagnosis Date Noted  . Preeclampsia, severe 10/14/2018  . Hypertension in pregnancy, pre-eclampsia, severe, delivered/postpartum 10/13/2018  . Vaginal delivery 10/09/2018  . Vaginal bleeding in pregnancy, third trimester 10/04/2018  . Scoliosis deformity of spine 08/22/2018  . Anemia of pregnancy 06/28/2018  . Pregnant 02/28/2018  . Palpitations 01/06/2018  . Panic attacks 01/06/2018    Past Surgical History:  Procedure Laterality Date  . THERAPEUTIC ABORTION       OB History    Gravida  3   Para  2   Term  2   Preterm  0   AB  1   Living  2     SAB  0   TAB  1   Ectopic  0   Multiple  0   Live Births  2           Family History  Problem Relation Age of Onset  . Depression Father     Social History   Tobacco Use  . Smoking status: Never Smoker  . Smokeless tobacco: Never Used  Vaping Use  . Vaping Use: Never used  Substance Use  Topics  . Alcohol use: Not Currently    Comment: occ  . Drug use: No    Home Medications Prior to Admission medications   Medication Sig Start Date End Date Taking? Authorizing Provider  butalbital-acetaminophen-caffeine (FIORICET, ESGIC) 50-325-40 MG tablet Take 1 tablet by mouth every 4 (four) hours as needed for headache (If unrelieved by Ibuprofen). 10/14/18   Jaymes Graff, MD  NIFEdipine (ADALAT CC) 30 MG 24 hr tablet Take 1 tablet (30 mg total) by mouth daily. 10/16/18   Prothero, Henderson Newcomer, CNM  prenatal vitamin w/FE, FA (NATACHEW) 29-1 MG CHEW chewable tablet Chew 1 tablet by mouth daily at 12 noon.    [provider]    Allergies    Patient has no known allergies.  Review of Systems   Review of Systems  Genitourinary: Positive for vaginal bleeding.  All other systems reviewed and are negative.   Physical Exam Updated Vital Signs BP 121/80 (BP Location: Right Arm)   Pulse 64   Temp 98.7 F (37.1 C) (Oral)   Resp 18   Ht 5\' 7"  (1.702 m)   Wt 68.5 kg   LMP 12/31/2019   SpO2  100%   BMI 23.65 kg/m   Physical Exam Vitals and nursing note reviewed.  HENT:     Head: Normocephalic.     Nose: Nose normal.     Mouth/Throat:     Mouth: Mucous membranes are moist.  Eyes:     Extraocular Movements: Extraocular movements intact.     Pupils: Pupils are equal, round, and reactive to light.  Cardiovascular:     Rate and Rhythm: Normal rate and regular rhythm.     Pulses: Normal pulses.  Pulmonary:     Effort: Pulmonary effort is normal.  Abdominal:     General: Abdomen is flat.     Palpations: Abdomen is soft.  Genitourinary:    Comments: Os is closed, dry blood in the vault, no obvious tissue visualized, no uterine or adnexal tenderness  Musculoskeletal:        General: Normal range of motion.     Cervical back: Normal range of motion.  Skin:    General: Skin is warm.     Capillary Refill: Capillary refill takes less than 2 seconds.  Neurological:      General: No focal deficit present.     Mental Status: She is alert.  Psychiatric:        Mood and Affect: Mood normal.        Behavior: Behavior normal.     ED Results / Procedures / Treatments   Labs (all labs ordered are listed, but only abnormal results are displayed) Labs Reviewed  COMPREHENSIVE METABOLIC PANEL - Abnormal; Notable for the following components:      Result Value   Glucose, Bld 104 (*)    Calcium 8.5 (*)    All other components within normal limits  HCG, QUANTITATIVE, PREGNANCY - Abnormal; Notable for the following components:   hCG, Beta Chain, Quant, S 4,161 (*)    All other components within normal limits  CBC WITH DIFFERENTIAL/PLATELET    EKG None  Radiology US OB LESS THAN 14 WEEKS WITH OB TRANSVAGINAL  Result Date: 02/14/2020 CLINICAL DATA:  34 year old pregnant female with vaginal bleeding. LMP: 12/31/2019 corresponding to an estimated gestational age of [redacted] weeks, 3 days. EXAM: OBSTETRIC <14 WK [place] AND TRANSVAGINAL OB US TECHNIQUE: Both transabdominal and transvaginal ultrasound examinations were performed for complete evaluation of the gestation as well as the maternal uterus, adnexal regions, and pelvic cul-de-sac. Transvaginal technique was performed to assess early pregnancy. COMPARISON:  None. FINDINGS: Intrauterine gestational sac: Single intrauterine gestational sac. Yolk sac:  Seen Embryo:  Not present at this time Cardiac Activity: N/A Heart Rate: N/A  bpm MSD: 8 mm   5 w   4 d Subchorionic hemorrhage:  None visualized. Maternal uterus/adnexae: The uterus is retroverted. The maternal ovaries are unremarkable. IMPRESSION: Single intrauterine gestational sac with an estimated gestational age of [redacted] weeks, 4 days. No fetal pole identified at this time. Follow-up with ultrasound in 7-11 days, or earlier if clinically indicated, recommended. Electronically Signed   By: Anner Crete M.D.   On: 02/14/2020 18:58    Procedures Procedures (including critical  care time)  Medications Ordered in ED Medications - No data to display  ED Course  I have reviewed the triage vital signs and the nursing notes.  Pertinent labs & imaging results that were available during my care of the patient were reviewed by me and considered in my medical decision making (see chart for details).    MDM Rules/Calculators/A&P  Darsha Zumstein is a 33 y.o. female here presenting with vaginal bleeding.  Patient is [redacted] weeks pregnant and had ultrasound yesterday that showed possible yolk sac.  Concern for possible miscarriage.  Will get hCG level and repeat ultrasound today.  7:32 PM HCG is 4161.  Ultrasound showed gestational sac but no obvious fetal pole.  Patient has GYN appointment tomorrow.  Recommend hCG check in 2 days.  Final Clinical Impression(s) / ED Diagnoses Final diagnoses:  Vaginal bleeding in pregnancy, first trimester    Rx / DC Orders ED Discharge Orders    None       Charlynne Pander, MD 02/14/20 [phone]

## 2020-02-14 NOTE — ED Notes (Signed)
Provider @ bedside before RN could assess.   Pelvic cart placed at bedside per MD.

## 2020-02-14 NOTE — ED Triage Notes (Signed)
Pt states 5 weeks preg with prenatal care, vaginal bleeding x 1 day

## 2020-02-14 NOTE — Discharge Instructions (Signed)
Stay hydrated.  Take Tylenol as needed for pain.  Your hCG level is 4161 today.  You need to have repeat hCG level in 2 days.  No sex until your bleeding stops.  Follow-up with your OB in 2 days.  Return to ER if you have gush of fluid, severe cramps, blood clots or tissue coming out.

## 2020-02-16 ENCOUNTER — Encounter (HOSPITAL_COMMUNITY): Payer: Self-pay | Admitting: Obstetrics & Gynecology

## 2020-02-16 ENCOUNTER — Other Ambulatory Visit: Payer: Self-pay

## 2020-02-16 ENCOUNTER — Inpatient Hospital Stay (HOSPITAL_COMMUNITY)
Admission: AD | Admit: 2020-02-16 | Discharge: 2020-02-16 | Disposition: A | Discharge status: Home / Self Care | Payer: Medicaid Other | Attending: Obstetrics & Gynecology | Admitting: Obstetrics & Gynecology

## 2020-02-16 ENCOUNTER — Inpatient Hospital Stay (HOSPITAL_COMMUNITY): Payer: Medicaid Other

## 2020-02-16 DIAGNOSIS — N939 Abnormal uterine and vaginal bleeding, unspecified: Secondary | ICD-10-CM | POA: Diagnosis present

## 2020-02-16 DIAGNOSIS — Z3A01 Less than 8 weeks gestation of pregnancy: Secondary | ICD-10-CM | POA: Diagnosis not present

## 2020-02-16 DIAGNOSIS — O039 Complete or unspecified spontaneous abortion without complication: Secondary | ICD-10-CM

## 2020-02-16 DIAGNOSIS — Z803 Family history of malignant neoplasm of breast: Secondary | ICD-10-CM | POA: Insufficient documentation

## 2020-02-16 DIAGNOSIS — O209 Hemorrhage in early pregnancy, unspecified: Secondary | ICD-10-CM | POA: Diagnosis not present

## 2020-02-16 LAB — CBC
HCT: 38.9 % (ref 36.0–46.0)
Hemoglobin: 12.8 g/dL (ref 12.0–15.0)
MCH: 29.2 pg (ref 26.0–34.0)
MCHC: 32.9 g/dL (ref 30.0–36.0)
MCV: 88.6 fL (ref 80.0–100.0)
Platelets: 217 10*3/uL (ref 150–400)
RBC: 4.39 MIL/uL (ref 3.87–5.11)
RDW: 13.1 % (ref 11.5–15.5)
WBC: 9 10*3/uL (ref 4.0–10.5)
nRBC: 0 % (ref 0.0–0.2)

## 2020-02-16 LAB — HCG, QUANTITATIVE, PREGNANCY: hCG, Beta Chain, Quant, S: 2551 m[IU]/mL — ABNORMAL HIGH (ref <5)

## 2020-02-16 MED ORDER — IBUPROFEN 800 MG PO TABS
800.0000 mg | ORAL_TABLET | Freq: Once | ORAL | Status: AC
  Administered 2020-02-16: 800 mg via ORAL
  Filled 2020-02-16: qty 1

## 2020-02-16 MED ORDER — IBUPROFEN 600 MG PO TABS: 600.0000 mg | ORAL_TABLET | Freq: Four times a day (QID) | ORAL | 0 refills | Status: DC | PRN

## 2020-02-16 NOTE — MAU Provider Note (Signed)
History     CSN: [id number]  Arrival date and time: 02/16/20 2001   First Provider Initiated Contact with Patient 02/16/20 2131      Chief Complaint  Patient presents with  . Vaginal Bleeding   Theresa Winters is a 34 y.o. [id number] at [redacted]w[redacted]d who receives care at [facility].  She presents today for Vaginal Bleeding.  She reports that she passed a large clot today around 714pm.  She states she had been experiencing bleeding and cramping since Wednesday that was similar to a light period.  She states today the bleeding "picked up around 1245 or 1pm."  She states she noticed large clots around 315pm.  She states her bleeding now "just bleeding" and she is cramping.      OB History    Gravida  4   Para  2   Term  2   Preterm  0   AB  1   Living  2     SAB  0   TAB  1   Ectopic  0   Multiple  0   Live Births  2           Past Medical History:  Diagnosis Date  . Anemia    with pregnancy  . Dyspnea   . Medical history non-contributory   . Scoliosis   . Scoliosis     Past Surgical History:  Procedure Laterality Date  . THERAPEUTIC ABORTION      Family History  Problem Relation Age of Onset  . Cancer Mother        Breast Cancer   . Depression Father     Social History   Tobacco Use  . Smoking status: Never Smoker  . Smokeless tobacco: Never Used  Vaping Use  . Vaping Use: Never used  Substance Use Topics  . Alcohol use: Not Currently    Comment: occ  . Drug use: No    Allergies: No Known Allergies  Medications Prior to Admission  Medication Sig Dispense Refill Last Dose  . prenatal vitamin w/FE, FA (NATACHEW) 29-1 MG CHEW chewable tablet Chew 1 tablet by mouth daily at 12 noon.   02/16/2020 at Unknown time  . butalbital-acetaminophen-caffeine (FIORICET, ESGIC) 50-325-40 MG tablet Take 1 tablet by mouth every 4 (four) hours as needed for headache (If unrelieved by Ibuprofen). 14 tablet 0   . NIFEdipine (ADALAT CC) 30 MG 24 hr tablet Take 1 tablet  (30 mg total) by mouth daily. 30 tablet 2     Review of Systems  Constitutional: Negative for chills and fever.  Gastrointestinal: Negative for abdominal pain, nausea and vomiting.  Genitourinary: Positive for vaginal bleeding. Negative for difficulty urinating, dysuria, pelvic pain and vaginal discharge.  Musculoskeletal: Positive for back pain.  Neurological: Positive for headaches (10/10).   Physical Exam   Blood pressure 121/83, pulse 82, temperature 98.1 F (36.7 C), resp. rate 19, weight 65.9 kg, last menstrual period 12/31/2019, currently breastfeeding.  Physical Exam  MAU Course  Procedures Results for orders placed or performed during the hospital encounter of 02/16/20 (from the past 24 hour(s))  CBC     Status: None   Collection Time: 02/16/20  9:00 PM  Result Value Ref Range   WBC 9.0 4.0 - 10.5 K/uL   RBC 4.39 3.87 - 5.11 MIL/uL   Hemoglobin 12.8 12.0 - 15.0 g/dL   HCT 74.9 36 - 46 %   MCV 88.6 80.0 - 100.0 fL   MCH 29.2 26.0 - 34.0  pg   MCHC 32.9 30.0 - 36.0 g/dL   RDW 13.1 11.5 - 15.5 %   Platelets 217 150 - 400 K/uL   nRBC 0.0 0.0 - 0.2 %  hCG, quantitative, pregnancy     Status: Abnormal   Collection Time: 02/16/20  9:00 PM  Result Value Ref Range   hCG, Beta Chain, Quant, S 2,551 (H) <5 mIU/mL   US OB Transvaginal  Result Date: 02/16/2020 CLINICAL DATA:  Vaginal bleeding EXAM: TRANSVAGINAL OB ULTRASOUND TECHNIQUE: Transvaginal ultrasound was performed for complete evaluation of the gestation as well as the maternal uterus, adnexal regions, and pelvic cul-de-sac. COMPARISON:  02/14/2020 FINDINGS: Intrauterine gestational sac: None Maternal uterus/adnexae: Uterus is retroverted. Endometrium measures 7 mm in thickness. Trace fluid within the endometrial cavity. Left ovary measures 4.4 x 2.7 by 3.0 cm, with a 2.5 x 1.6 x 1.6 cm minimally complex cyst or follicle. The right ovary measures 2.9 x 1.9 x 1.5 cm. IMPRESSION: 1. No evidence of intrauterine pregnancy. The  gestational sac seen previously is no longer identified, compatible with spontaneous abortion. 2. No retained products of conception. 3. Likely residual of corpus luteum cyst within the left adnexa. Electronically Signed   By: Randa Ngo M.D.   On: 02/16/2020 22:35    MDM Labs: hCG Ultrasound Pain Medication Assessment and Plan  34 year old  G4P2012 at 6.5 weeks Vaginal Bleeding  -Labs ordered and collected prior to provider at bedside.  -Reviewed POC with patient. -Reviewed picture of clot passed and patient informed that suspicious for complete SAB.  -Will send for [place] to identify if IUGS can still be visualized.  -Informed that will give Ibuprofen 800mg  after [place]. -Patient declines STD stating she had them in office on Monday.  -Will await [place] and lab results.   Maryann Conners 02/16/2020, 9:31 PM    -[place] returns with no identifiable IUGS. -hCG with significant decrease. -Provider to bedside to discuss results. -Condolences given. -Patient informed of need for follow up in office for repeat hCG in ~2 weeks.  Patient informed that provider will contact her primary office regarding scheduling of follow up visit.  Encouraged to call office if no contact in one week.  -Bleeding Precautions Given. -Patient reports improvement of pain with ibuprofen dosing and Rx for 600mg  tablets sent to pharmacy on file.  -Encouraged to call or return to MAU if symptoms worsen or with the onset of new symptoms. -Discharged to home in stable condition.  Maryann Conners MSN, CNM Advanced Practice Provider, [facility]   Addendum 02/17/2020  8:01 PM   -On-Call provider contacted and informed of patient recent SAB and need for follow up.  All relevant information given and questions addressed.   Maryann Conners MSN, CNM Advanced Practice Provider, [facility]

## 2020-02-16 NOTE — Discharge Instructions (Signed)

## 2020-02-16 NOTE — MAU Note (Signed)
Patient states her bleeding has increased since yesterday.  Went in for blood work this AM.  Used the restroom this afternoon and saw blood clots, then 3 hours later passed what looked like a clear sac.  Reports now she has continued to have a lot of cramping and continued bleeding.  Has not taken anything for her pain.

## 2020-03-18 ENCOUNTER — Other Ambulatory Visit: Payer: Self-pay | Admitting: Obstetrics & Gynecology

## 2020-04-17 ENCOUNTER — Other Ambulatory Visit (HOSPITAL_COMMUNITY)
Admission: RE | Admit: 2020-04-17 | Discharge: 2020-04-17 | Disposition: A | Discharge status: Home / Self Care | Payer: Medicaid Other | Source: Ambulatory Visit | Attending: Obstetrics & Gynecology | Admitting: Obstetrics & Gynecology

## 2020-04-17 DIAGNOSIS — Z01812 Encounter for preprocedural laboratory examination: Secondary | ICD-10-CM | POA: Diagnosis present

## 2020-04-17 DIAGNOSIS — Z20822 Contact with and (suspected) exposure to covid-19: Secondary | ICD-10-CM | POA: Diagnosis not present

## 2020-04-17 LAB — SARS CORONAVIRUS 2 (TAT 6-24 HRS): SARS Coronavirus 2: NEGATIVE

## 2020-04-19 ENCOUNTER — Ambulatory Visit (HOSPITAL_COMMUNITY)
Admission: RE | Admit: 2020-04-19 | Payer: Medicaid Other | Source: Home / Self Care | Admitting: Obstetrics & Gynecology

## 2020-04-19 ENCOUNTER — Encounter (HOSPITAL_COMMUNITY): Admission: RE | Payer: Self-pay | Source: Home / Self Care

## 2020-04-19 SURGERY — LEEP (LOOP ELECTROSURGICAL EXCISION PROCEDURE)
Anesthesia: Choice

## 2020-04-26 ENCOUNTER — Other Ambulatory Visit: Payer: Self-pay | Admitting: Obstetrics & Gynecology

## 2020-07-31 ENCOUNTER — Encounter (HOSPITAL_COMMUNITY): Payer: Self-pay | Admitting: Obstetrics and Gynecology

## 2020-07-31 ENCOUNTER — Other Ambulatory Visit: Payer: Self-pay

## 2020-07-31 ENCOUNTER — Inpatient Hospital Stay (HOSPITAL_COMMUNITY)
Admission: AD | Admit: 2020-07-31 | Discharge: 2020-08-01 | Disposition: A | Discharge status: Home / Self Care | Payer: Medicaid Other | Attending: Obstetrics and Gynecology | Admitting: Obstetrics and Gynecology

## 2020-07-31 DIAGNOSIS — E860 Dehydration: Secondary | ICD-10-CM

## 2020-07-31 DIAGNOSIS — O219 Vomiting of pregnancy, unspecified: Secondary | ICD-10-CM | POA: Diagnosis not present

## 2020-07-31 DIAGNOSIS — Z79899 Other long term (current) drug therapy: Secondary | ICD-10-CM | POA: Insufficient documentation

## 2020-07-31 DIAGNOSIS — Z3A08 8 weeks gestation of pregnancy: Secondary | ICD-10-CM | POA: Diagnosis not present

## 2020-07-31 LAB — CBC
HCT: 35.5 % — ABNORMAL LOW (ref 36.0–46.0)
Hemoglobin: 12.2 g/dL (ref 12.0–15.0)
MCH: 29.2 pg (ref 26.0–34.0)
MCHC: 34.4 g/dL (ref 30.0–36.0)
MCV: 84.9 fL (ref 80.0–100.0)
Platelets: 170 10*3/uL (ref 150–400)
RBC: 4.18 MIL/uL (ref 3.87–5.11)
RDW: 14 % (ref 11.5–15.5)
WBC: 10.1 10*3/uL (ref 4.0–10.5)
nRBC: 0 % (ref 0.0–0.2)

## 2020-07-31 LAB — COMPREHENSIVE METABOLIC PANEL
ALT: 12 U/L (ref 0–44)
AST: 15 U/L (ref 15–41)
Albumin: 4 g/dL (ref 3.5–5.0)
Alkaline Phosphatase: 48 U/L (ref 38–126)
Anion gap: 9 (ref 5–15)
BUN: 17 mg/dL (ref 6–20)
CO2: 23 mmol/L (ref 22–32)
Calcium: 9 mg/dL (ref 8.9–10.3)
Chloride: 103 mmol/L (ref 98–111)
Creatinine, Ser: 0.64 mg/dL (ref 0.44–1.00)
GFR, Estimated: >60 mL/min (ref >60)
Glucose, Bld: 97 mg/dL (ref 70–99)
Potassium: 3.9 mmol/L (ref 3.5–5.1)
Sodium: 135 mmol/L (ref 135–145)
Total Bilirubin: 0.4 mg/dL (ref 0.3–1.2)
Total Protein: 6.9 g/dL (ref 6.5–8.1)

## 2020-07-31 LAB — WET PREP, GENITAL
Sperm: NONE SEEN
Trich, Wet Prep: NONE SEEN
Yeast Wet Prep HPF POC: NONE SEEN

## 2020-07-31 MED ORDER — ONDANSETRON 4 MG PO TBDP: 4.0000 mg | ORAL_TABLET | Freq: Four times a day (QID) | ORAL | 0 refills | Status: DC | PRN

## 2020-07-31 MED ORDER — DEXAMETHASONE SODIUM PHOSPHATE 10 MG/ML IJ SOLN: 4.0000 mg | Freq: Once | INTRAMUSCULAR | Status: DC

## 2020-07-31 MED ORDER — VITAMIN B-6 50 MG PO TABS: 50.0000 mg | ORAL_TABLET | Freq: Every day | ORAL | 1 refills | Status: DC

## 2020-07-31 MED ORDER — ONDANSETRON HCL 4 MG/2ML IJ SOLN: 4.0000 mg | Freq: Once | INTRAMUSCULAR | Status: DC

## 2020-07-31 MED ORDER — LACTATED RINGERS IV SOLN: INTRAVENOUS | Status: DC

## 2020-07-31 NOTE — Discharge Instructions (Signed)

## 2020-07-31 NOTE — MAU Note (Signed)
Wynelle Bourgeois CNM aware pt refused IV meds and just wants iv flds and will take meds at home.

## 2020-07-31 NOTE — MAU Note (Addendum)
Pt states she feels better. Ate graham crackers and drank ginger ale. Pt states she threw up alittle of the ginger ale but does not want any medicine here. States again she will take whatever is prescribed when she gets home.  Wynelle Bourgeois CNM aware.

## 2020-07-31 NOTE — MAU Note (Signed)
Having a lot of n/v and unable to keep down anything. Lightheaded and have not urinated since this am. Think I have BV from the odor but no d/c. I get BV a lot. No vag bleeding. Only have abd pains in AM and think that is from sleeping balled up. Went to have TAB last Fri and could not go thru it. Had u/s on Fri and was [redacted]w[redacted]d by [place]

## 2020-07-31 NOTE — MAU Provider Note (Addendum)
Chief Complaint: Emesis During Pregnancy   First Provider Initiated Contact with Patient 07/31/20 2201        SUBJECTIVE HPI: Theresa Winters is a 34 y.o. [id number] at Unknown by LMP who presents to maternity admissions reporting nausea and vomiting.  Has not urinated all day.  . She denies vaginal bleeding, vaginal itching/burning, urinary symptoms, h/a, dizziness, or fever/chills.  Had planned an EAB so had [place] confirming IUP there, then changed her mind and left.   Emesis  This is a new problem. The current episode started today. The problem has been unchanged. There has been no fever. Pertinent negatives include no abdominal pain, chills, diarrhea, dizziness, fever, headaches or myalgias. She has tried nothing for the symptoms.   RN Note: Having a lot of n/v and unable to keep down anything. Lightheaded and have not urinated since this am. Think I have BV from the odor but no d/c. I get BV a lot. No vag bleeding. Only have abd pains in AM and think that is from sleeping balled up. Went to have TAB last Fri and could not go thru it. Had u/s on Fri and was [redacted]w[redacted]d by [place]  Past Medical History:  Diagnosis Date   Anemia    with pregnancy   Dyspnea    Medical history non-contributory    Scoliosis    Scoliosis    Past Surgical History:  Procedure Laterality Date   THERAPEUTIC ABORTION     Social History   Socioeconomic History   Marital status: Single    Spouse name: Not on file   Number of children: Not on file   Years of education: Not on file   Highest education level: Not on file  Occupational History   Not on file  Tobacco Use   Smoking status: Never Smoker   Smokeless tobacco: Never Used  Vaping Use   Vaping Use: Never used  Substance and Sexual Activity   Alcohol use: Not Currently    Comment: occ   Drug use: No   Sexual activity: Yes    Birth control/protection: None  Other Topics Concern   Not on file  Social History Narrative   Not on file    Social Determinants of Health   Financial Resource Strain:    Difficulty of Paying Living Expenses: Not on file  Food Insecurity:    Worried About Running Out of Food in the Last Year: Not on file   [employer] of Food in the Last Year: Not on file  Transportation Needs:    Lack of Transportation (Medical): Not on file   Lack of Transportation (Non-Medical): Not on file  Physical Activity:    Days of Exercise per Week: Not on file   Minutes of Exercise per Session: Not on file  Stress:    Feeling of Stress : Not on file  Social Connections:    Frequency of Communication with Friends and Family: Not on file   Frequency of Social Gatherings with Friends and Family: Not on file   Attends Religious Services: Not on file   Active Member of Clubs or Organizations: Not on file   Attends Banker Meetings: Not on file   Marital Status: Not on file  Intimate Partner Violence:    Fear of Current or Ex-Partner: Not on file   Emotionally Abused: Not on file   Physically Abused: Not on file   Sexually Abused: Not on file   No current facility-administered medications on file prior to  encounter.   Current Outpatient Medications on File Prior to Encounter  Medication Sig Dispense Refill   cholecalciferol (VITAMIN D3) 25 MCG (1000 UNIT) tablet Take 1,000 Units by mouth daily.     ECHINACEA PO Take 1 capsule by mouth daily.     ibuprofen (ADVIL) 600 MG tablet Take 1 tablet (600 mg total) by mouth every 6 (six) hours as needed. (Patient not taking: Reported on 04/15/2020) 30 tablet 0   Multiple Vitamin (MULTIVITAMIN WITH MINERALS) TABS tablet Take 1 tablet by mouth daily.     nitrofurantoin, macrocrystal-monohydrate, (MACROBID) 100 MG capsule Take 100 mg by mouth 2 (two) times daily.     prenatal vitamin w/FE, FA (NATACHEW) 29-1 MG CHEW chewable tablet Chew 1 tablet by mouth daily at 12 noon. (Patient not taking: Reported on 04/15/2020)     zinc gluconate 50 MG  tablet Take 50 mg by mouth daily.     No Known Allergies  I have reviewed patient's Past Medical Hx, Surgical Hx, Family Hx, Social Hx, medications and allergies.   ROS:  Review of Systems  Constitutional: Negative for chills and fever.  Gastrointestinal: Positive for vomiting. Negative for abdominal pain and diarrhea.  Musculoskeletal: Negative for myalgias.  Neurological: Negative for dizziness and headaches.   Review of Systems  Other systems negative   Physical Exam  Physical Exam Patient Vitals for the past 24 hrs:  BP Pulse Height Weight  07/31/20 1944 131/79 79 -- --  07/31/20 1934 -- -- 5\' 7"  (1.702 m) 66.2 kg   Constitutional: Well-developed, female in no acute distress.  Cardiovascular: normal rate Respiratory: normal effort GI: Abd soft, non-tender. Pos BS x 4 MS: Extremities nontender, no edema, normal ROM Neurologic: Alert and oriented x 4.  GU: Neg CVAT.  PELVIC EXAM:  LAB RESULTS Results for orders placed or performed during the hospital encounter of 07/31/20 (from the past 24 hour(s))  Wet prep, genital     Status: Abnormal   Collection Time: 07/31/20  9:27 PM   Specimen: Vaginal  Result Value Ref Range   Yeast Wet Prep HPF POC NONE SEEN NONE SEEN   Trich, Wet Prep NONE SEEN NONE SEEN   Clue Cells Wet Prep HPF POC PRESENT (A) NONE SEEN   WBC, Wet Prep HPF POC MANY (A) NONE SEEN   Sperm NONE SEEN   Comprehensive metabolic panel     Status: None   Collection Time: 07/31/20 10:04 PM  Result Value Ref Range   Sodium 135 135 - 145 mmol/L   Potassium 3.9 3.5 - 5.1 mmol/L   Chloride 103 98 - 111 mmol/L   CO2 23 22 - 32 mmol/L   Glucose, Bld 97 70 - 99 mg/dL   BUN 17 6 - 20 mg/dL   Creatinine, Ser 14/08/21 0.44 - 1.00 mg/dL   Calcium 9.0 8.9 - 4.69 mg/dL   Total Protein 6.9 6.5 - 8.1 g/dL   Albumin 4.0 3.5 - 5.0 g/dL   AST 15 15 - 41 U/L   ALT 12 0 - 44 U/L   Alkaline Phosphatase 48 38 - 126 U/L   Total Bilirubin 0.4 0.3 - 1.2 mg/dL   GFR, Estimated  62.9 >52 mL/min   Anion gap 9 5 - 15  CBC     Status: Abnormal   Collection Time: 07/31/20 10:04 PM  Result Value Ref Range   WBC 10.1 4.0 - 10.5 K/uL   RBC 4.18 3.87 - 5.11 MIL/uL   Hemoglobin 12.2 12.0 - 15.0  g/dL   HCT 92.9 (L) 57.4 - 73.4 %   MCV 84.9 80.0 - 100.0 fL   MCH 29.2 26.0 - 34.0 pg   MCHC 34.4 30.0 - 36.0 g/dL   RDW 03.7 09.6 - 43.8 %   Platelets 170 150 - 400 K/uL   nRBC 0.0 0.0 - 0.2 %    IMAGING No results found.  MAU Management/MDM: Ordered labs to assess for systemic disease.  These reflect normal liver and kidney function.   IV fluids given for hydration Ordered medications which the patient declined to take.  Wants to wait until she gets home to take meds.  Felt much better after IV fluids. States mouth is no longer dry.   ASSESSMENT Pregnancy at [redacted]w[redacted]d Nausea and vomiting  PLAN Discharge home Rx zofran for nausea at home. Reviewed small risk of defects  Rx Vitamin B6 to augment med for nausea.  Pt stable at time of discharge. Encouraged to return here if she develops worsening of symptoms, increase in pain, fever, or other concerning symptoms.    Wynelle Bourgeois CNM, MSN Certified Nurse-Midwife 07/31/2020  10:01 PM

## 2020-08-01 ENCOUNTER — Encounter (HOSPITAL_COMMUNITY): Payer: Self-pay | Admitting: *Deleted

## 2020-08-01 NOTE — Progress Notes (Signed)
Marie Williams CNM in to discuss d/c plan with pt. Written and verbal d/c instructions given and understanding voiced. 

## 2020-08-02 IMAGING — US US OB < 14 WEEKS - US OB TV
1 series · 14 of 28 positions shown · non-contrast
Comparison: None.

CLINICAL DATA: 33-year-old pregnant female with vaginal bleeding.
LMP: 12/31/2019 corresponding to an estimated gestational age of 6
weeks, 3 days.

EXAM:
OBSTETRIC <14 WK US AND TRANSVAGINAL OB US
TECHNIQUE: Both transabdominal and transvaginal ultrasound examinations were
performed for complete evaluation of the gestation as well as the
maternal uterus, adnexal regions, and pelvic cul-de-sac.
Transvaginal technique was performed to assess early pregnancy.

[Series 1: us ob < 14 weeks - us ob tv · 31 acquisitions, 14 frames shown]
[im 2/31]
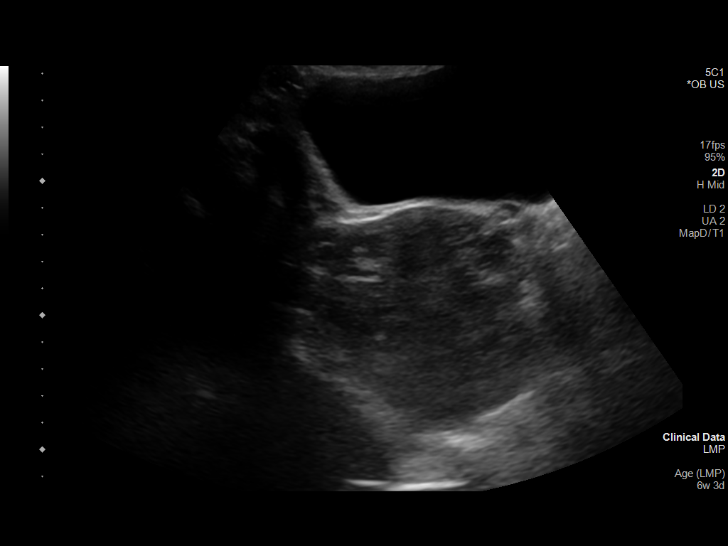
[im 4/31]
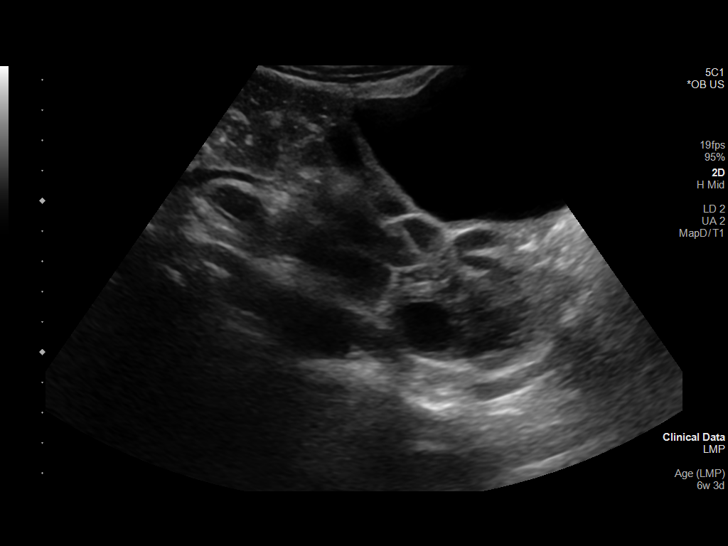
[im 6/31]
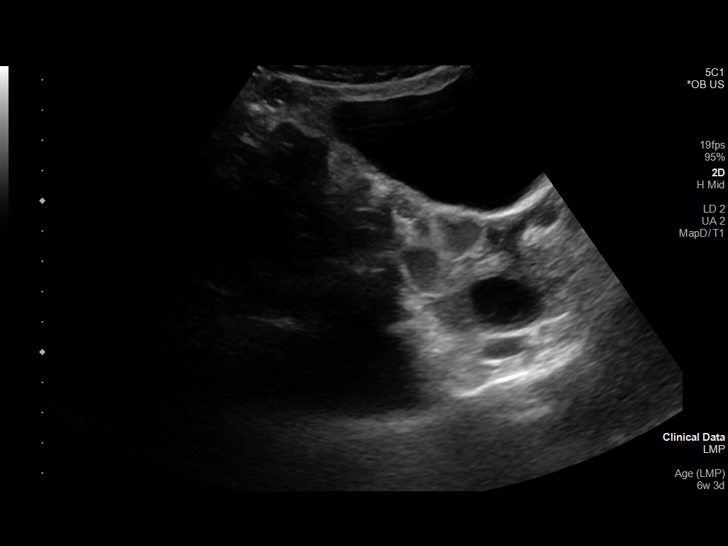
[im 8/31]
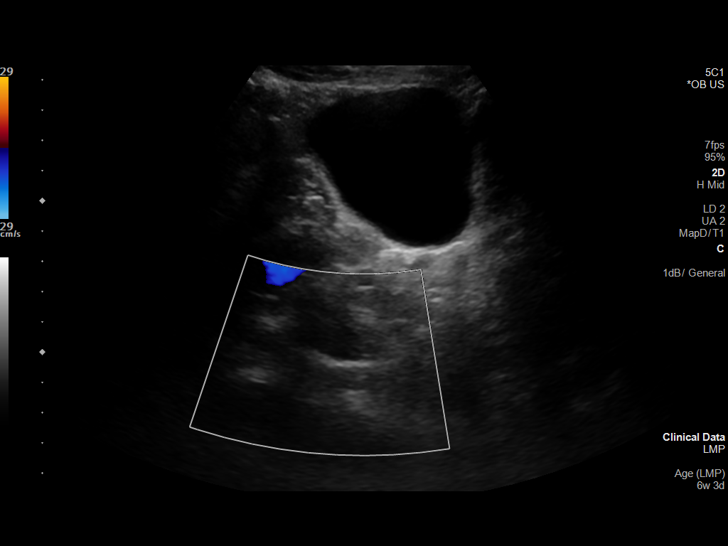
[im 11/31]
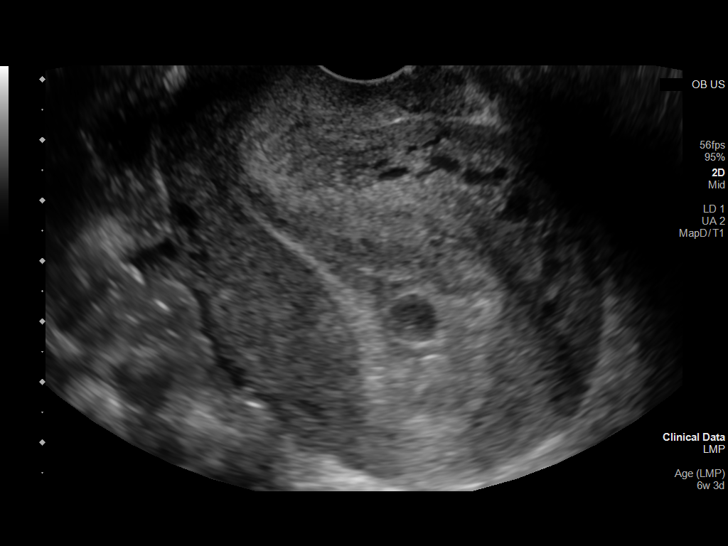
[im 13/31]
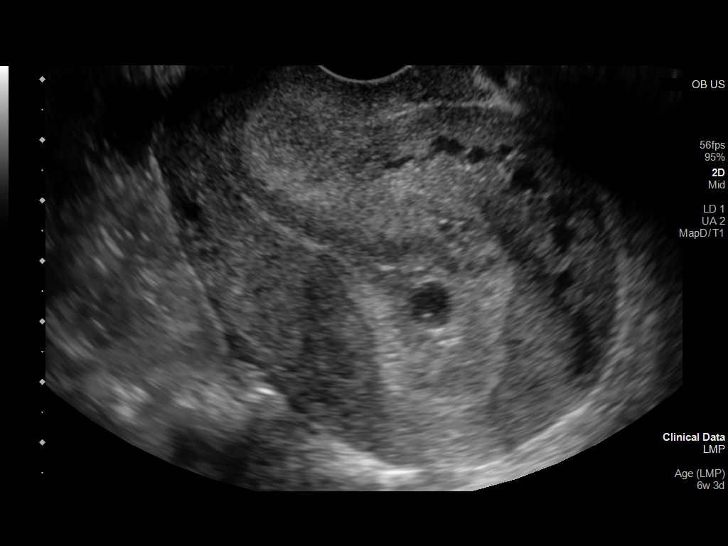
[im 15/31]
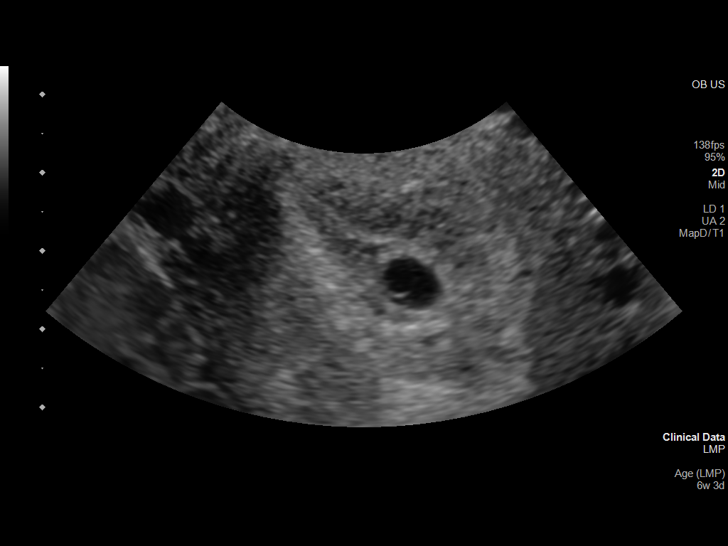
[im 17/31]
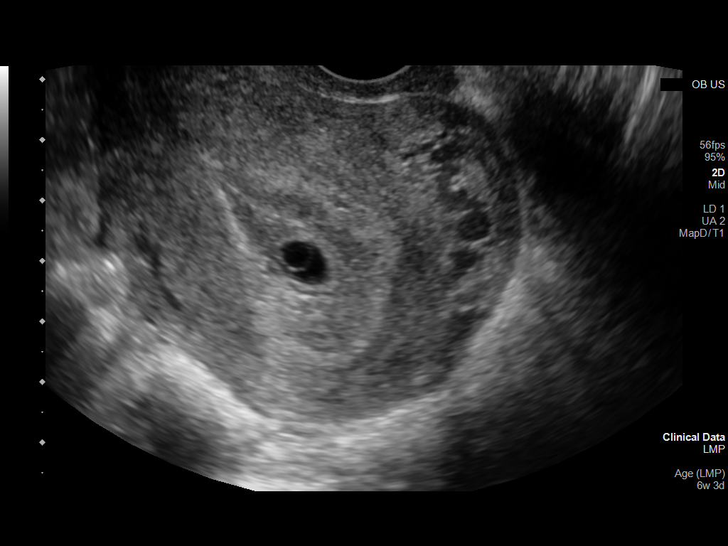
[im 19/31]
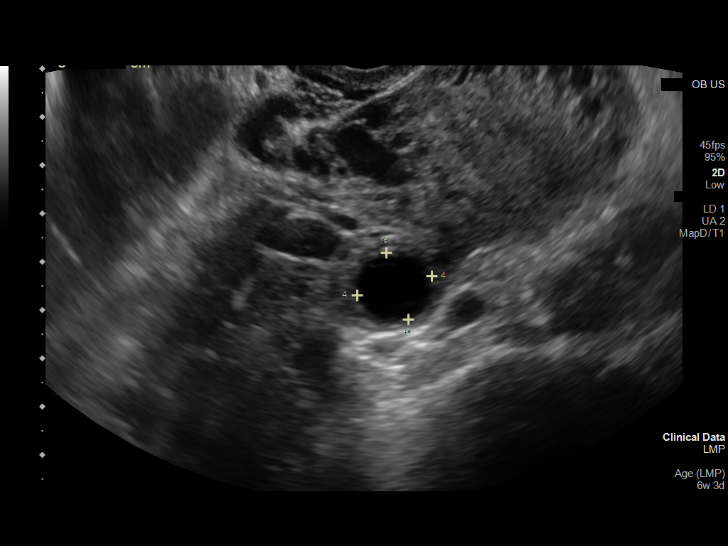
[im 22/31]
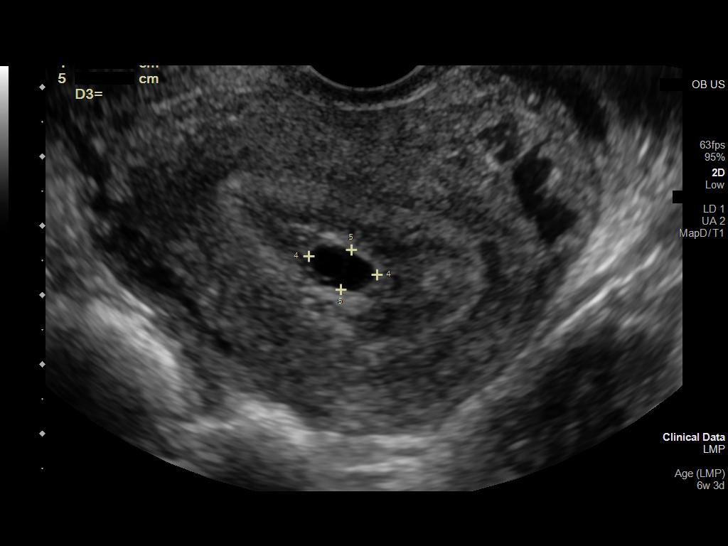
[im 24/31]
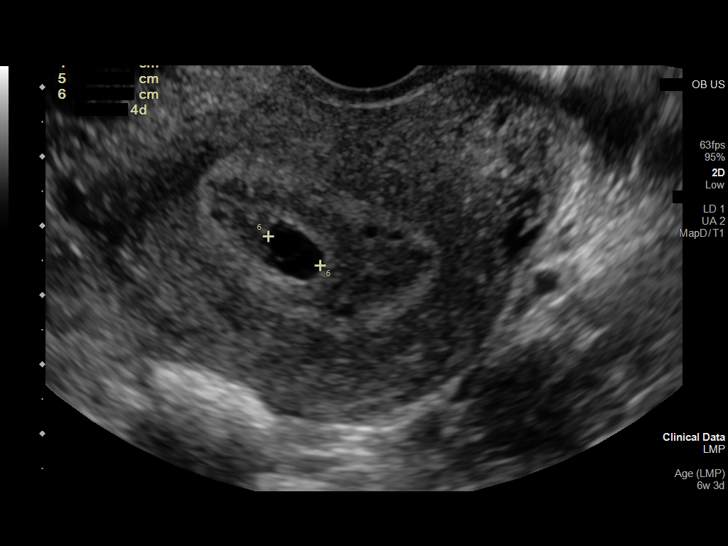
[im 26/31]
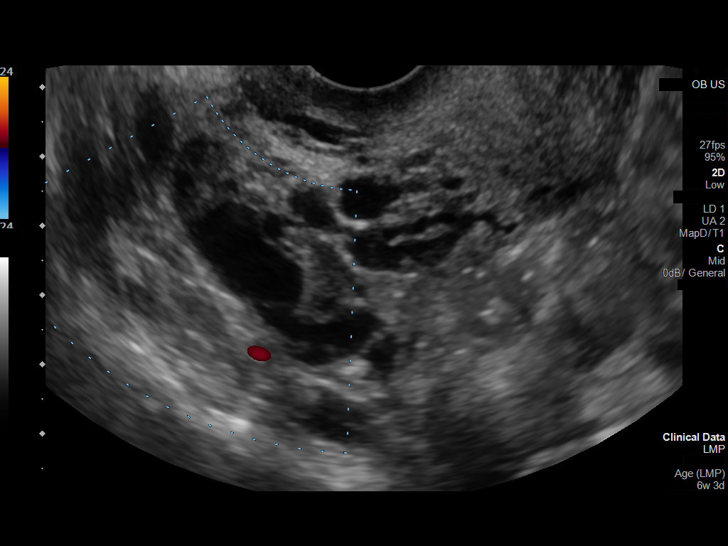
[im 28/31]
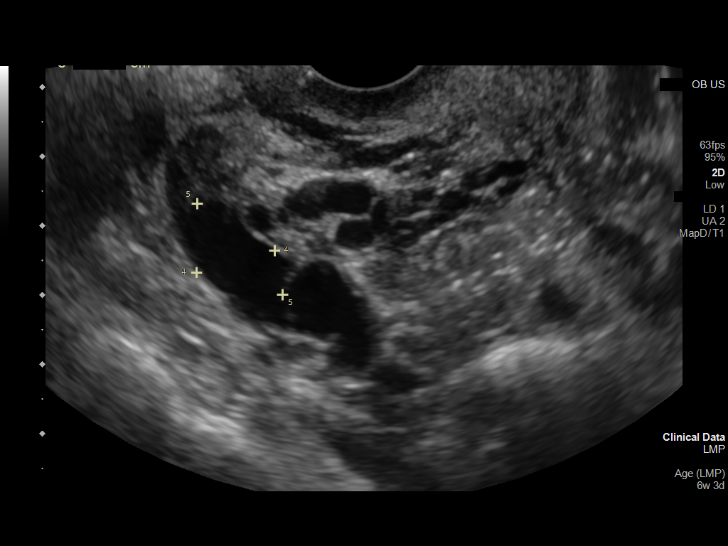
[im 31/31]
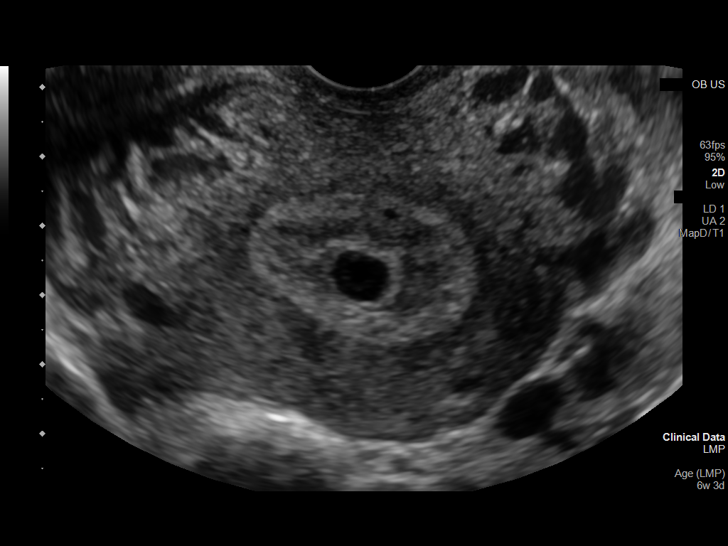

[14 of 28 positions shown; findings below may reference images not displayed]

FINDINGS: Intrauterine gestational sac: Single intrauterine gestational sac.

Yolk sac:  Seen

Embryo:  Not present at this time

Cardiac Activity: N/A

Heart Rate: N/A  bpm

MSD: 8 mm   5 w   4 d

Subchorionic hemorrhage:  None visualized.

Maternal uterus/adnexae: The uterus is retroverted. The maternal
ovaries are unremarkable.
IMPRESSION: Single intrauterine gestational sac with an estimated gestational
age of 5 weeks, 4 days. No fetal pole identified at this time.
Follow-up with ultrasound in 7-11 days, or earlier if clinically
indicated, recommended.

## 2020-08-04 IMAGING — US US OB TRANSVAGINAL
1 series · 15 of 28 positions shown · non-contrast
Comparison: 02/14/2020

CLINICAL DATA: Vaginal bleeding

EXAM:
TRANSVAGINAL OB ULTRASOUND
TECHNIQUE: Transvaginal ultrasound was performed for complete evaluation of the
gestation as well as the maternal uterus, adnexal regions, and
pelvic cul-de-sac.

[Series 1: us ob transvaginal · 15 of 37 slices shown]
[im 1/37]
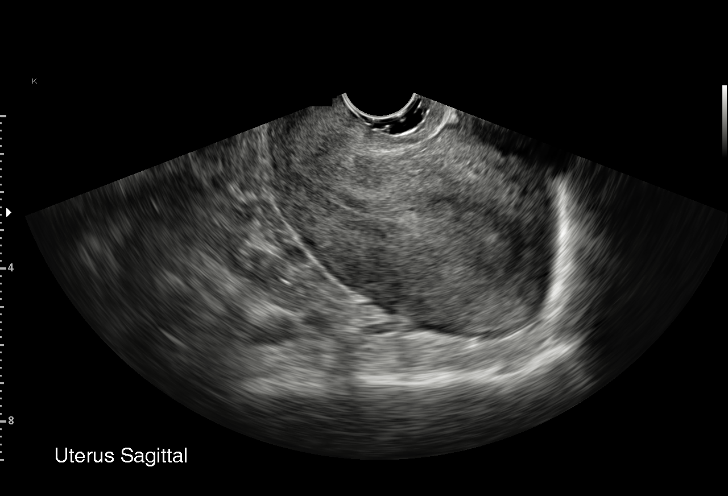
[im 3/37]
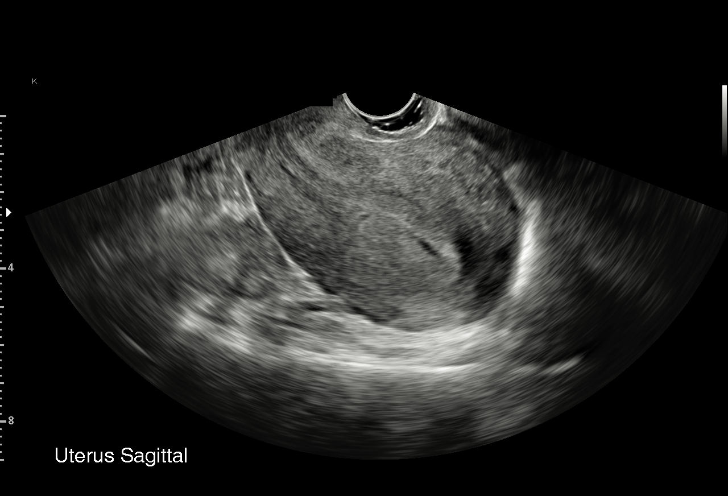
[im 6/37]
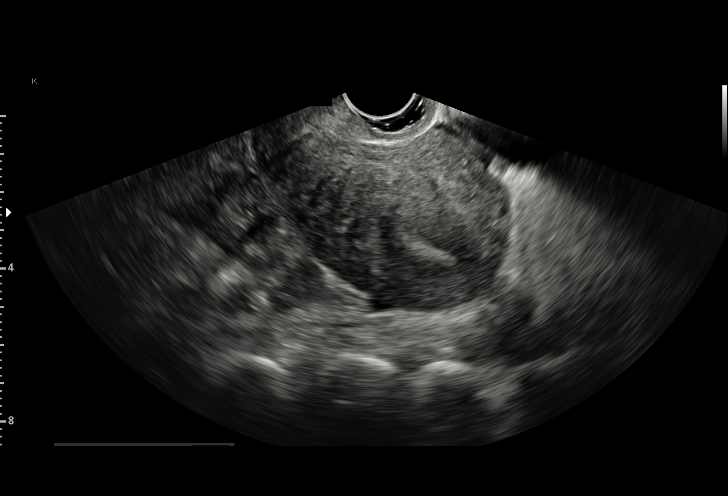
[im 9/37]
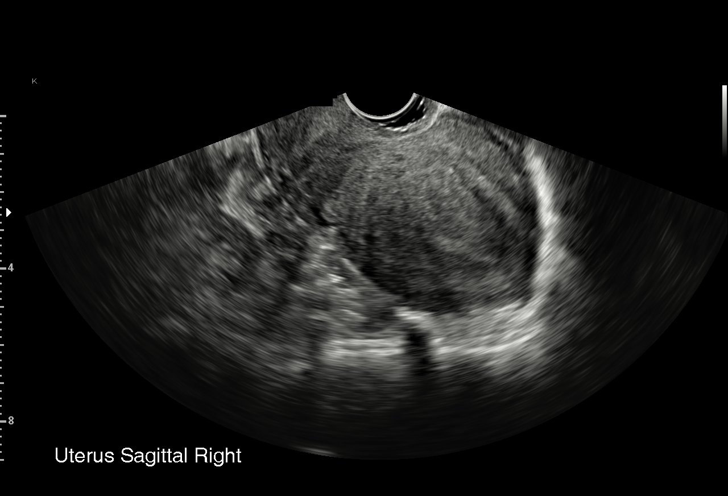
[im 11/37]
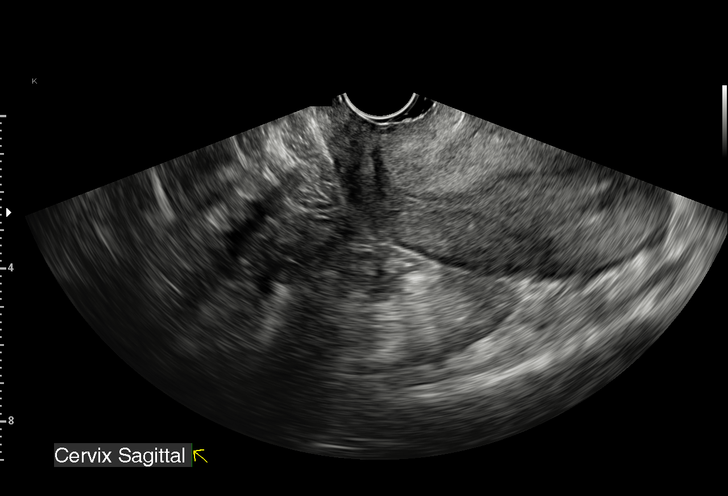
[im 14/37]
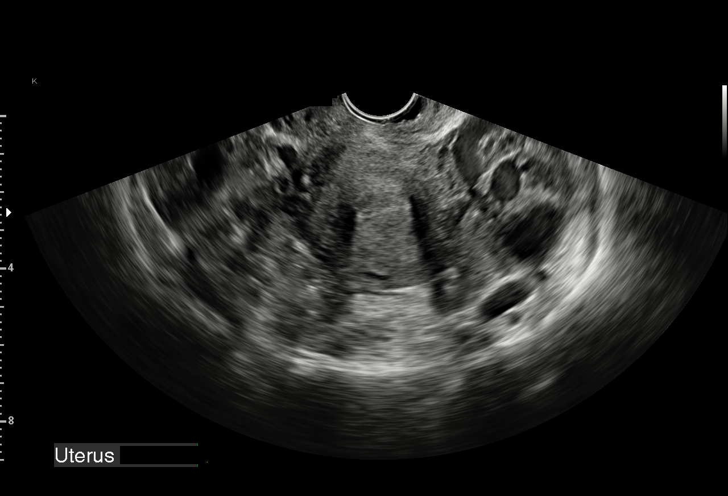
[im 17/37]
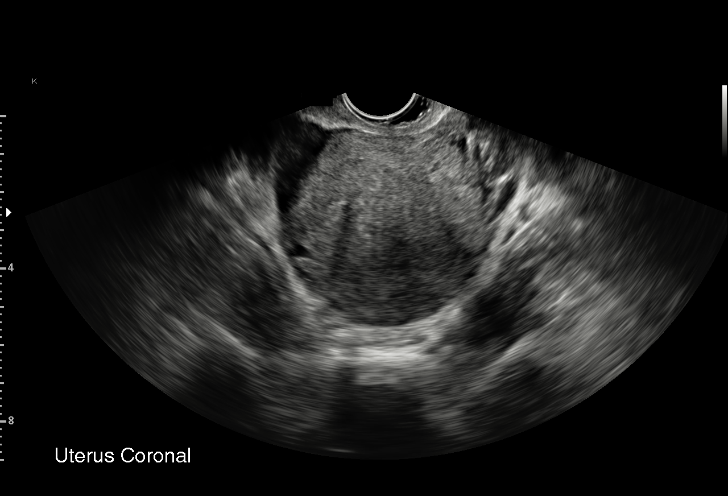
[im 19/37]
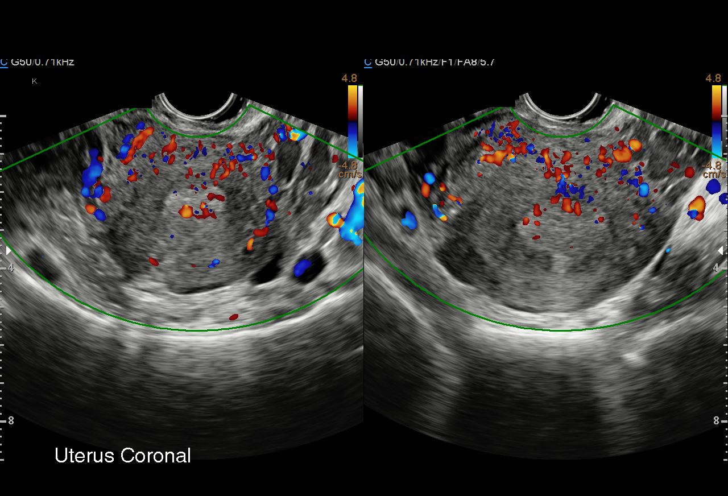
[im 21/37]
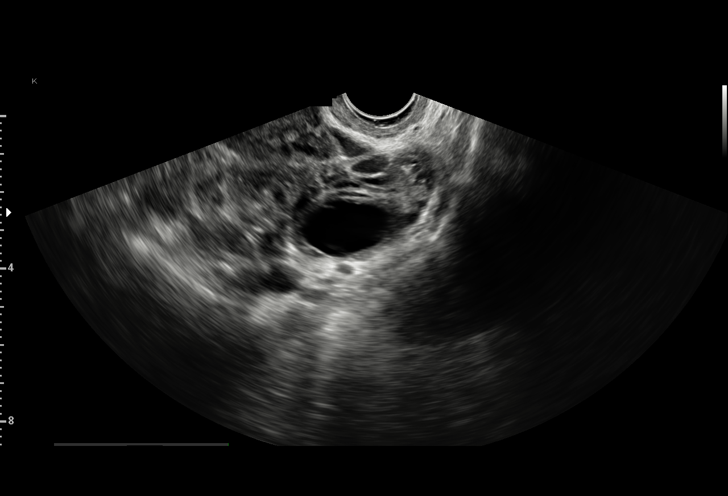
[im 23/37]
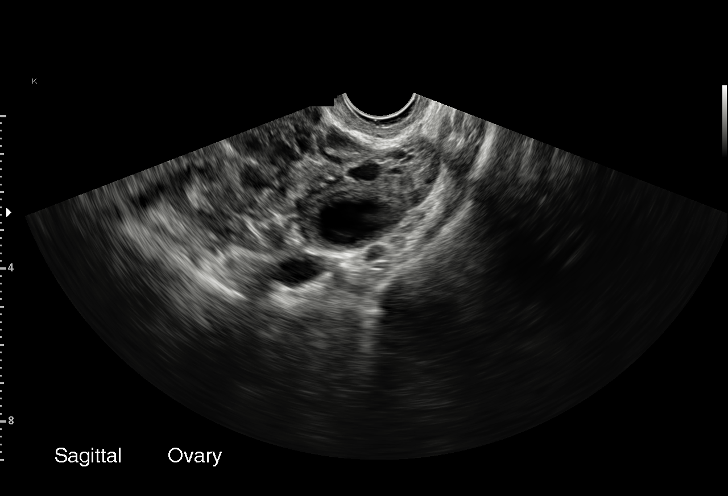
[im 26/37]
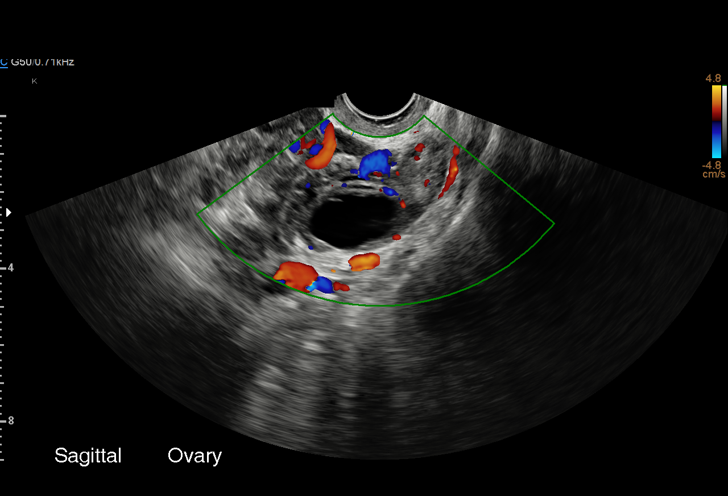
[im 29/37]
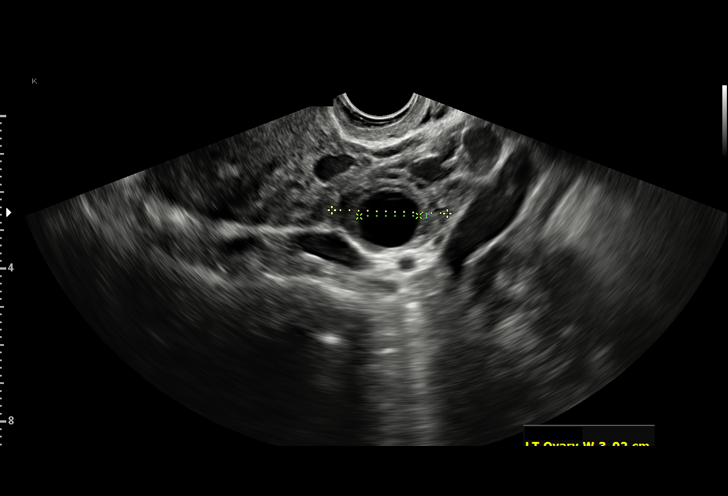
[im 31/37]
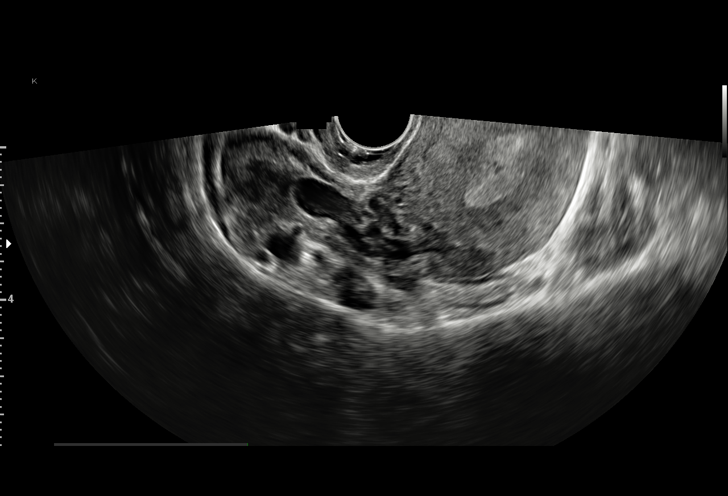
[im 34/37]
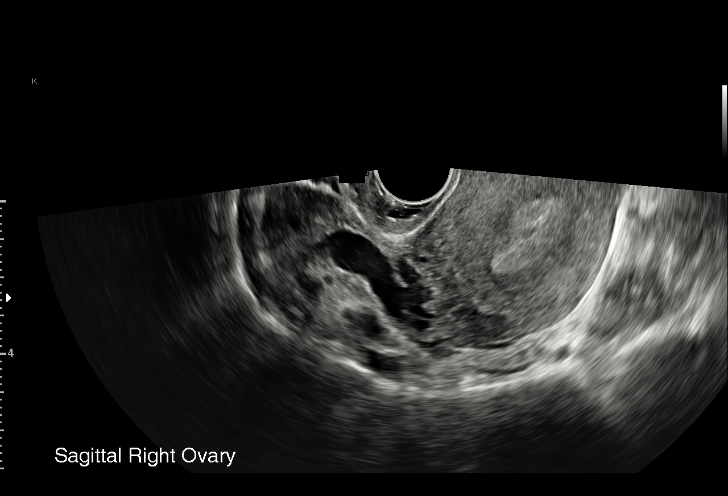
[im 37/37]
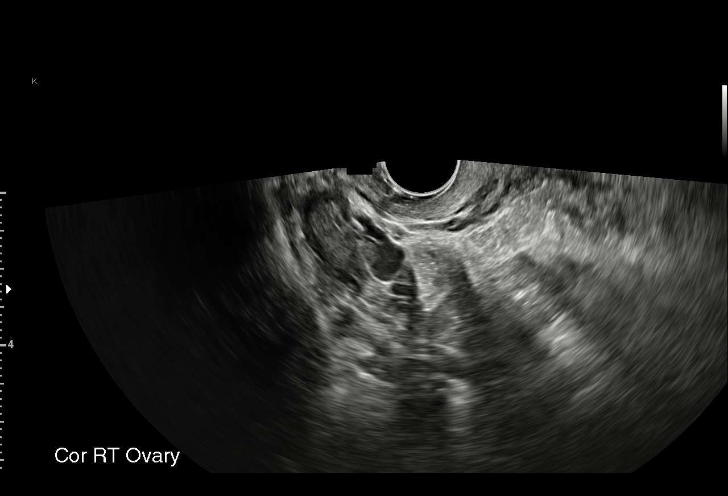

[15 of 28 positions shown; findings below may reference images not displayed]

FINDINGS: Intrauterine gestational sac: None

Maternal uterus/adnexae: Uterus is retroverted. Endometrium measures
7 mm in thickness. Trace fluid within the endometrial cavity.

Left ovary measures 4.4 x 2.7 by 3.0 cm, with a 2.5 x 1.6 x 1.6 cm
minimally complex cyst or follicle. The right ovary measures 2.9 x
1.9 x 1.5 cm.
IMPRESSION: 1. No evidence of intrauterine pregnancy. The gestational sac seen
previously is no longer identified, compatible with spontaneous
abortion.
2. No retained products of conception.
3. Likely residual of corpus luteum cyst within the left adnexa.

## 2020-08-06 LAB — OB RESULTS CONSOLE RUBELLA ANTIBODY, IGM: Rubella: IMMUNE

## 2020-08-06 LAB — OB RESULTS CONSOLE HEPATITIS B SURFACE ANTIGEN: Hepatitis B Surface Ag: NEGATIVE

## 2020-08-24 NOTE — L&D Delivery Note (Signed)
Delivery Note At 10:15 PM a viable and healthy female was delivered via SVD APGAR: 9, 9  weight  pending   Placenta status: Spontaneous, Intact.  To pathology, short cord Cord: 3 vessels with the following complications: None.    Anesthesia: Epidural Episiotomy: None Lacerations: None Suture Repair:  na Est. Blood Loss (mL): 350  Mom to postpartum.  Baby to Couplet care / Skin to Skin Desires inpatient circumcision Desires PP BTL, will discuss with oncoming provider and verify insurance with consent forms.  Victorino Dike B Keilana Morlock 03/08/2021, 10:30 PM

## 2020-08-29 ENCOUNTER — Inpatient Hospital Stay (HOSPITAL_COMMUNITY)
Admission: AD | Admit: 2020-08-29 | Discharge: 2020-08-29 | Disposition: A | Discharge status: Home / Self Care | Payer: Medicaid Other | Attending: Family Medicine | Admitting: Family Medicine

## 2020-08-29 ENCOUNTER — Other Ambulatory Visit: Payer: Self-pay

## 2020-08-29 DIAGNOSIS — O074 Failed attempted termination of pregnancy without complication: Secondary | ICD-10-CM | POA: Diagnosis not present

## 2020-08-29 DIAGNOSIS — Z3A12 12 weeks gestation of pregnancy: Secondary | ICD-10-CM | POA: Insufficient documentation

## 2020-08-29 DIAGNOSIS — O26851 Spotting complicating pregnancy, first trimester: Secondary | ICD-10-CM | POA: Insufficient documentation

## 2020-08-29 DIAGNOSIS — R109 Unspecified abdominal pain: Secondary | ICD-10-CM | POA: Diagnosis not present

## 2020-08-29 DIAGNOSIS — O26891 Other specified pregnancy related conditions, first trimester: Secondary | ICD-10-CM | POA: Insufficient documentation

## 2020-08-29 NOTE — Discharge Instructions (Signed)
Threatened Miscarriage  A threatened miscarriage occurs when a woman has vaginal bleeding during the first 20 weeks of pregnancy but the pregnancy has not ended. If you have vaginal bleeding during this time, your health care provider will do tests to make sure you are still pregnant. If the tests show that you are still pregnant and that the developing baby (fetus) inside your uterus is still growing, your condition is considered a threatened miscarriage. A threatened miscarriage does not mean your pregnancy will end, but it does increase the risk of losing your pregnancy (complete miscarriage). What are the causes? The cause of this condition is usually not known. For women who go on to have a complete miscarriage, the most common cause is an abnormal number of chromosomes in the developing baby. Chromosomes are the structures inside cells that hold all of a person's genetic material. What increases the risk? The following lifestyle factors may increase your risk of a miscarriage in early pregnancy:  Smoking.  Drinking excessive amounts of alcohol or caffeine.  Recreational drug use. The following preexisting health conditions may increase your risk of a miscarriage in early pregnancy:  Polycystic ovary syndrome.  Uterine fibroids.  Infections.  Diabetes mellitus. What are the signs or symptoms? Symptoms of this condition include:  Vaginal bleeding.  Mild abdominal pain or cramps. How is this diagnosed? If you have bleeding with or without abdominal pain before 20 weeks of pregnancy, your health care provider will do tests to check whether you are still pregnant. These will include:  Ultrasound. This test uses sound waves to create images of the inside of your uterus. This allows your health care provider to look at your developing baby and other structures, such as your placenta.  Pelvic exam. This is an internal exam of your vagina and cervix.  Measurement of your baby's heart  rate.  Laboratory tests such as blood tests, urine tests, or swabs for infection You may be diagnosed with a threatened miscarriage if:  Ultrasound testing shows that you are still pregnant.  Your babys heart rate is strong.  A pelvic exam shows that the opening between your uterus and your vagina (cervix) is closed.  Blood tests confirm that you are still pregnant. How is this treated? No treatments have been shown to prevent a threatened miscarriage from going on to a complete miscarriage. However, the right home care is important. Follow these instructions at home:  Get plenty of rest.  Do not have sex or use tampons if you have vaginal bleeding.  Do not douche.  Do not smoke or use recreational drugs.  Do not drink alcohol.  Avoid caffeine.  Keep all follow-up prenatal visits as told by your health care provider. This is important. Contact a health care provider if:  You have light vaginal bleeding or spotting while pregnant.  You have abdominal pain or cramping.  You have a fever. Get help right away if:  You have heavy vaginal bleeding.  You have blood clots coming from your vagina.  You pass tissue from your vagina.  You leak fluid, or you have a gush of fluid from your vagina.  You have severe low back pain or abdominal cramps.  You have fever, chills, and severe abdominal pain. Summary  A threatened miscarriage occurs when a woman has vaginal bleeding during the first 20 weeks of pregnancy but the pregnancy has not ended.  The cause of a threatened miscarriage is usually not known.  Symptoms of this condition may  include vaginal bleeding and mild abdominal pain or cramps.  No treatments have been shown to prevent a threatened miscarriage from going on to a complete miscarriage.  Keep all follow-up prenatal visits as told by your health care provider. This is important. This information is not intended to replace advice given to you by your health  care provider. Make sure you discuss any questions you have with your health care provider. Document Revised: 09/16/2017 Document Reviewed: 11/06/2016 Elsevier Patient Education  2020 [employer].        Threatened Miscarriage  A threatened miscarriage occurs when a woman has vaginal bleeding during the first 20 weeks of pregnancy but the pregnancy has not ended. If you have vaginal bleeding during this time, your health care provider will do tests to make sure you are still pregnant. If the tests show that you are still pregnant and that the developing baby (fetus) inside your uterus is still growing, your condition is considered a threatened miscarriage. A threatened miscarriage does not mean your pregnancy will end, but it does increase the risk of losing your pregnancy (complete miscarriage). What are the causes? The cause of this condition is usually not known. For women who go on to have a complete miscarriage, the most common cause is an abnormal number of chromosomes in the developing baby. Chromosomes are the structures inside cells that hold all of a person's genetic material. What increases the risk? The following lifestyle factors may increase your risk of a miscarriage in early pregnancy:  Smoking.  Drinking excessive amounts of alcohol or caffeine.  Recreational drug use. The following preexisting health conditions may increase your risk of a miscarriage in early pregnancy:  Polycystic ovary syndrome.  Uterine fibroids.  Infections.  Diabetes mellitus. What are the signs or symptoms? Symptoms of this condition include:  Vaginal bleeding.  Mild abdominal pain or cramps. How is this diagnosed? If you have bleeding with or without abdominal pain before 20 weeks of pregnancy, your health care provider will do tests to check whether you are still pregnant. These will include:  Ultrasound. This test uses sound waves to create images of the inside of your uterus.  This allows your health care provider to look at your developing baby and other structures, such as your placenta.  Pelvic exam. This is an internal exam of your vagina and cervix.  Measurement of your baby's heart rate.  Laboratory tests such as blood tests, urine tests, or swabs for infection You may be diagnosed with a threatened miscarriage if:  Ultrasound testing shows that you are still pregnant.  Your babys heart rate is strong.  A pelvic exam shows that the opening between your uterus and your vagina (cervix) is closed.  Blood tests confirm that you are still pregnant. How is this treated? No treatments have been shown to prevent a threatened miscarriage from going on to a complete miscarriage. However, the right home care is important. Follow these instructions at home:  Get plenty of rest.  Do not have sex or use tampons if you have vaginal bleeding.  Do not douche.  Do not smoke or use recreational drugs.  Do not drink alcohol.  Avoid caffeine.  Keep all follow-up prenatal visits as told by your health care provider. This is important. Contact a health care provider if:  You have light vaginal bleeding or spotting while pregnant.  You have abdominal pain or cramping.  You have a fever. Get help right away if:  You have heavy  vaginal bleeding.  You have blood clots coming from your vagina.  You pass tissue from your vagina.  You leak fluid, or you have a gush of fluid from your vagina.  You have severe low back pain or abdominal cramps.  You have fever, chills, and severe abdominal pain. Summary  A threatened miscarriage occurs when a woman has vaginal bleeding during the first 20 weeks of pregnancy but the pregnancy has not ended.  The cause of a threatened miscarriage is usually not known.  Symptoms of this condition may include vaginal bleeding and mild abdominal pain or cramps.  No treatments have been shown to prevent a threatened  miscarriage from going on to a complete miscarriage.  Keep all follow-up prenatal visits as told by your health care provider. This is important. This information is not intended to replace advice given to you by your health care provider. Make sure you discuss any questions you have with your health care provider. Document Revised: 09/16/2017 Document Reviewed: 11/06/2016 Elsevier Patient Education  2020 [employer].                        Safe Medications in Pregnancy    Acne: Benzoyl Peroxide Salicylic Acid  Backache/Headache: Tylenol: 2 regular strength every 4 hours OR              2 Extra strength every 6 hours  Colds/Coughs/Allergies: Benadryl (alcohol free) 25 mg every 6 hours as needed Breath right strips Claritin Cepacol throat lozenges Chloraseptic throat spray Cold-Eeze- up to three times per day Cough drops, alcohol free Flonase (by prescription only) Guaifenesin Mucinex Robitussin DM (plain only, alcohol free) Saline nasal spray/drops Sudafed (pseudoephedrine) & Actifed ** use only after [redacted] weeks gestation and if you do not have high blood pressure Tylenol Vicks Vaporub Zinc lozenges Zyrtec   Constipation: Colace Ducolax suppositories Fleet enema Glycerin suppositories Metamucil Milk of magnesia Miralax Senokot Smooth move tea  Diarrhea: Kaopectate Imodium A-D  *NO pepto Bismol  Hemorrhoids: Anusol Anusol HC Preparation H Tucks  Indigestion: Tums Maalox Mylanta Zantac  Pepcid  Insomnia: Benadryl (alcohol free) 25mg  every 6 hours as needed Tylenol PM Unisom, no Gelcaps  Leg Cramps: Tums MagGel  Nausea/Vomiting:  Bonine Dramamine Emetrol Ginger extract Sea bands Meclizine  Nausea medication to take during pregnancy:  Unisom (doxylamine succinate 25 mg tablets) Take one tablet daily at bedtime. If symptoms are not adequately controlled, the dose can be increased to a maximum recommended dose of two tablets daily  (1/2 tablet in the morning, 1/2 tablet mid-afternoon and one at bedtime). Vitamin B6 100mg  tablets. Take one tablet twice a day (up to 200 mg per day).  Skin Rashes: Aveeno products Benadryl cream or 25mg  every 6 hours as needed Calamine Lotion 1% cortisone cream  Yeast infection: Gyne-lotrimin 7 Monistat 7   **If taking multiple medications, please check labels to avoid duplicating the same active ingredients **take medication as directed on the label ** Do not exceed 4000 mg of tylenol in 24 hours **Do not take medications that contain aspirin or ibuprofen

## 2020-08-29 NOTE — MAU Note (Signed)
Pt went for TAB this am. Was unable to do procedure because pt states they could not find her cervix. States she took about 5 pills this am they gave her. Two pills started with "M", one antibiotic, one Zofran, and ibuprofen. Took all pills about 1000. Went to [facility] and noticed vag bleeding. Some cramping.

## 2020-08-29 NOTE — MAU Provider Note (Signed)
[facility]ulty [facility] OB/GYN Attending MAU Note  Chief Complaint: Abdominal Pain and Vaginal Bleeding    None     SUBJECTIVE Theresa Winters is a 35 y.o. [id number] at [redacted]w[redacted]d by LMP who presents following an attempt at suction D&C today at [facility] twice.  She has had a previous LEEP procedure and they were not able to find her cervix.  She reports taking 2 pills of starting rhythm but she does not know the name of.  She reports some mild cramping and spotting this afternoon and got very worried.  She is unclear if she is going to get there at this termination of pregnancy.  Past Medical History:  Diagnosis Date  . Anemia    with pregnancy  . Dyspnea   . Medical history non-contributory   . Scoliosis   . Scoliosis    OB History  Gravida Para Term Preterm AB Living  5 2 2  0 1 2  SAB IAB Ectopic Multiple Live Births  0 1 0 0 2    # Outcome Date GA Lbr Len/2nd Weight Sex Delivery Anes PTL Lv  5 Current           4 Term 10/07/18 [redacted]w[redacted]d / 18:20 3496 g F Vag-Spont EPI  LIV  3 IAB 2014          2 Term 12/14/10 [redacted]w[redacted]d  3459 g M Vag-Spont EPI N LIV  1 Gravida            Past Surgical History:  Procedure Laterality Date  . THERAPEUTIC ABORTION     Social History   Socioeconomic History  . Marital status: Single    Spouse name: Not on file  . Number of children: Not on file  . Years of education: Not on file  . Highest education level: Not on file  Occupational History  . Not on file  Tobacco Use  . Smoking status: Never Smoker  . Smokeless tobacco: Never Used  Vaping Use  . Vaping Use: Never used  Substance and Sexual Activity  . Alcohol use: Not Currently    Comment: occ  . Drug use: No  . Sexual activity: Yes    Birth control/protection: None  Other Topics Concern  . Not on file  Social History Narrative  . Not on file   Social Determinants of Health   Financial Resource Strain: Not on file  Food Insecurity: Not on file  Transportation Needs: Not on file  Physical  Activity: Not on file  Stress: Not on file  Social Connections: Not on file  Intimate Partner Violence: Not on file   No current facility-administered medications on file prior to encounter.   Current Outpatient Medications on File Prior to Encounter  Medication Sig Dispense Refill  . ondansetron (ZOFRAN ODT) 4 MG disintegrating tablet Take 1 tablet (4 mg total) by mouth every 6 (six) hours as needed for nausea. 20 tablet 0  . prenatal vitamin w/FE, FA (NATACHEW) 29-1 MG CHEW chewable tablet Chew 1 tablet by mouth daily at 12 noon. (Patient not taking: Reported on 04/15/2020)    . pyridOXINE (VITAMIN B-6) 50 MG tablet Take 1 tablet (50 mg total) by mouth daily. 30 tablet 1   No Known Allergies  ROS: Pertinent items in HPI  OBJECTIVE BP 133/83 (BP Location: Right Arm)   Pulse 84   Temp 98.4 F (36.9 C)   Resp 16   Ht 5\' 7"  (1.702 m)   Wt 68 kg   LMP 06/06/2020  SpO2 100%   BMI 23.49 kg/m  CONSTITUTIONAL: Well-developed, well-nourished female in no acute distress.  HENT:  Normocephalic, atraumatic, External right and left ear normal. Oropharynx is clear and moist EYES: Conjunctivae and EOM are normal. No scleral icterus.  NECK: Normal range of motion, supple, no masses.  Normal thyroid.  SKIN: Skin is warm and dry. No rash noted. Not diaphoretic. No erythema. No pallor. NEUROLGIC: Alert and oriented to person, place, and time. Normal muscle tone.  PSYCHIATRIC: Normal mood and affect. Normal behavior. Normal judgment and thought content. CARDIOVASCULAR: Normal heart rate noted RESPIRATORY: Effort and breath sounds normal, no problems with respiration noted. ABDOMEN: Soft, normal bowel sounds, no distention noted.  No tenderness, rebound or guarding.  MUSCULOSKELETAL: Normal range of motion. No tenderness.  No cyanosis, clubbing, or edema.  2+ distal pulses.  LAB RESULTS Blood type is B+  IMAGING Bedside ultrasound performed: Single live IUP with positive (good fetal movement  noted. Remaining labs all normal abdomen  MAU COURSE  ASSESSMENT 1. Attempted abortion without complication     PLAN Discharge home Patient to follow-up with her primary OB/GYN. Reassurance given.   Follow-up Information    [facility]. Schedule an appointment as soon as possible for a visit in 1 day(s).   Specialty: Obstetrics and Gynecology Contact information: [address] [place] [address] [phone]             Allergies as of 08/29/2020   No Known Allergies     Medication List    TAKE these medications   ondansetron 4 MG disintegrating tablet Commonly known as: Zofran ODT Take 1 tablet (4 mg total) by mouth every 6 (six) hours as needed for nausea.   prenatal vitamin w/FE, FA 29-1 MG Chew chewable tablet Chew 1 tablet by mouth daily at 12 noon.   pyridOXINE 50 MG tablet Commonly known as: VITAMIN B-6 Take 1 tablet (50 mg total) by mouth daily.       Evaluation does not show pathology that would require ongoing emergent intervention or inpatient treatment. Patient is hemodynamically stable and mentating appropriately. Discussed findings and plan with patient, who agrees with care plan. All questions answered. Return precautions discussed and outpatient follow up recommendations given.  Reva Bores, MD 08/29/2020 9:02 PM

## 2020-08-29 NOTE — MAU Note (Addendum)
Donia Ast NP in earlier to talk with pt and discuss d/c plan. Bedside u/s done and pt states baby was moving with good heart beat. Written and verbal instructions given and understanding voiced.

## 2020-11-28 ENCOUNTER — Other Ambulatory Visit: Payer: Self-pay

## 2020-11-28 ENCOUNTER — Encounter (HOSPITAL_COMMUNITY): Payer: Self-pay | Admitting: Obstetrics & Gynecology

## 2020-11-28 ENCOUNTER — Inpatient Hospital Stay (HOSPITAL_COMMUNITY)
Admission: AD | Admit: 2020-11-28 | Discharge: 2020-11-29 | Disposition: A | Discharge status: Home / Self Care | Payer: Medicaid Other | Attending: Obstetrics & Gynecology | Admitting: Obstetrics & Gynecology

## 2020-11-28 DIAGNOSIS — O99512 Diseases of the respiratory system complicating pregnancy, second trimester: Secondary | ICD-10-CM | POA: Diagnosis not present

## 2020-11-28 DIAGNOSIS — Z20822 Contact with and (suspected) exposure to covid-19: Secondary | ICD-10-CM | POA: Insufficient documentation

## 2020-11-28 DIAGNOSIS — J101 Influenza due to other identified influenza virus with other respiratory manifestations: Secondary | ICD-10-CM | POA: Insufficient documentation

## 2020-11-28 DIAGNOSIS — O212 Late vomiting of pregnancy: Secondary | ICD-10-CM | POA: Diagnosis not present

## 2020-11-28 DIAGNOSIS — Z79899 Other long term (current) drug therapy: Secondary | ICD-10-CM | POA: Diagnosis not present

## 2020-11-28 DIAGNOSIS — Z3A25 25 weeks gestation of pregnancy: Secondary | ICD-10-CM | POA: Diagnosis not present

## 2020-11-28 LAB — URINALYSIS, ROUTINE W REFLEX MICROSCOPIC
Bilirubin Urine: NEGATIVE
Glucose, UA: NEGATIVE mg/dL
Hgb urine dipstick: NEGATIVE
Ketones, ur: NEGATIVE mg/dL
Leukocytes,Ua: NEGATIVE
Nitrite: NEGATIVE
Protein, ur: NEGATIVE mg/dL
Specific Gravity, Urine: 1.012 (ref 1.005–1.030)
pH: 6 (ref 5.0–8.0)

## 2020-11-28 MED ORDER — LACTATED RINGERS IV BOLUS
1000.0000 mL | Freq: Once | INTRAVENOUS | Status: AC
  Administered 2020-11-29: 1000 mL via INTRAVENOUS

## 2020-11-28 NOTE — MAU Note (Signed)
Pt reports her son tested positive for the flu and now pt is sick. Feels weak, vomiting since last pm. Body aches, nasal congestion, cough. Pt reports pain in her chest when she coughs. Headache all day today. Temp 100.0 at home. Took tylenol at [employer]

## 2020-11-29 DIAGNOSIS — J101 Influenza due to other identified influenza virus with other respiratory manifestations: Secondary | ICD-10-CM

## 2020-11-29 DIAGNOSIS — Z3A25 25 weeks gestation of pregnancy: Secondary | ICD-10-CM

## 2020-11-29 DIAGNOSIS — O99512 Diseases of the respiratory system complicating pregnancy, second trimester: Secondary | ICD-10-CM

## 2020-11-29 LAB — RESP PANEL BY RT-PCR (FLU A&B, COVID) ARPGX2
Influenza A by PCR: POSITIVE — AB
Influenza B by PCR: NEGATIVE
SARS Coronavirus 2 by RT PCR: NEGATIVE

## 2020-11-29 MED ORDER — OSELTAMIVIR PHOSPHATE 75 MG PO CAPS: 75.0000 mg | ORAL_CAPSULE | Freq: Two times a day (BID) | ORAL | 0 refills | Status: AC

## 2020-11-29 NOTE — Discharge Instructions (Signed)
-Your test was positive for Influenza A. -Start tamiflu 75mg  twice daily for 5 days. -For congestion you may use nasal saline. For throat discomfort you may use honey with warm drinks. You may use tylenol every 6 hours as needed for pain, body aches and/or fever. -Call your clinic if you develop difficulty breathing, shortness of breath, inability to tolerate fluids by mouth, decreased fetal movement, vaginal bleeding, leakage of fluid, or signs of preterm labor. -Please contact your clinic to reschedule your upcoming appointment.  Pregnancy and Influenza Influenza, also called the flu, is an infection of the lungs and airways (respiratory tract). If you are pregnant, you are more likely to catch the flu. You are also more likely to have serious illness from the flu. This is because pregnancy causes changes to your body's disease-fighting system (immune system), heart, and lungs. If you develop serious illness from the flu, this can cause problems for you and your developing baby. How do people get the flu? The flu is caused by a type of germ called a virus. It spreads when virus particles get passed from person to person by:  Being near a sick person who is coughing or sneezing.  Touching something that has the virus on it and then touching your mouth, nose, or face. The influenza virus is most common during the fall and winter. What actions can I take to protect myself against the flu?  Get a flu shot. The best way to prevent the flu is to get a flu shot before flu season starts. The flu shot is not dangerous for your developing baby. It may even help protect your baby from the flu for up to 6 months after birth.  Wash your hands often with soap and warm water for at least 20 seconds. If soap and water are not available, use alcohol-based hand sanitizer.  Do not come in close contact with sick people.  Do not share food, drinks, or utensils with other people.  Avoid touching your eyes,  nose, and mouth.  Clean frequently used surfaces at home, school, or work.  Practice healthy lifestyle habits, such as: ? Eating a healthy, balanced diet. ? Drinking plenty of fluids. ? Exercising regularly or as told by your health care provider. ? Sleeping 7-9 hours each night. ? Finding ways to manage stress.   What should I do if I have flu symptoms?  If you have any symptoms of the flu, even after getting a flu shot, contact your health care provider right away.  To reduce fever, take over-the-counter acetaminophen as told by your health care provider.  If you have the flu, your health care provider may give you antiviral medicine to keep the flu from becoming severe and to shorten how long it lasts.  Avoid spreading the flu to others: ? Stay home until you are well. ? Cover your nose and mouth when you cough or sneeze. ? Wash your hands often.   Follow these instructions at home:  Take over-the-counter and prescription medicines only as told by your health care provider. Do not take any medicine, including cold or flu medicine, unless your health care provider tells you to do so.  If you were prescribed antiviral medicine, take it as told by your health care provider. Do not stop taking the antiviral medicine even if you start to feel better.  Eat a nutrient-rich diet that includes fresh fruits and vegetables, whole grains, lean protein, and low-fat dairy.  Drink enough fluid to keep  your urine clear or pale yellow.  Get plenty of rest.  Keep all follow-up visits. This is important. Contact a health care provider if:  You have a fever or chills.  You have a cough, sore throat, or stuffy nose.  You have worsening or unusual muscle aches, headache, tiredness, or loss of appetite.  You have vomiting or diarrhea. Get help right away if:  You have trouble breathing.  You have chest pain.  You have abdominal pain.  You begin to have labor pains.  You do not feel  your baby move.  You have diarrhea or vomiting that will not go away.  You have dizziness or confusion.  Your symptoms do not improve, even with treatment. These symptoms may represent a serious problem that is an emergency. Do not wait to see if the symptoms will go away. Get medical help right away. Call your local emergency services (911 in the [place] Do not drive yourself to the hospital. Summary  If you are pregnant, you are more likely to catch the flu. You are also more likely to have a more serious case of the flu.  If you have flu-like symptoms, call your health care provider right away. If you develop serious illness from the flu, this can cause problems for you and your developing baby.  The best way to prevent the flu is to get a flu shot before flu season starts. The flu shot is safe during pregnancy and not dangerous for your developing baby.  If you have the flu and were prescribed antiviral medicine, take it as told by your health care provider. This information is not intended to replace advice given to you by your health care provider. Make sure you discuss any questions you have with your health care provider. Document Revised: 03/30/2020 Document Reviewed: 03/30/2020 Elsevier Patient Education  2021 [employer].

## 2020-11-29 NOTE — MAU Provider Note (Signed)
Chief Complaint:  Headache, Fever, Emesis, and Cough   Event Date/Time   First Provider Initiated Contact with Patient 11/28/20 2344     HPI: Theresa Winters is a 35 y.o. [id number] at [redacted]w[redacted]d by LMP who presents to maternity admissions reporting concern of body aches, congestion, cough and decreased appetite for the past 24 hours. Pt reports that her son was diagnosed with influenza yesterday and then she began having symptoms. She had a max temperature of 100F at home. She recently took tylenol at 1830. She endorses minimal po intake but denies nausea and vomiting. She reports good fetal movement, denies LOF, vaginal bleeding, vaginal itching/burning, urinary symptoms, dizziness, n/v, or fever/chills. No history of flu or COVID vaccines. Pt reports that she "feels dehydrated".  Past Medical History: Past Medical History:  Diagnosis Date  . Anemia    with pregnancy  . Dyspnea   . Medical history non-contributory   . Scoliosis   . Scoliosis     Past obstetric history: OB History  Gravida Para Term Preterm AB Living  5 2 2  0 1 2  SAB IAB Ectopic Multiple Live Births  0 1 0 0 2    # Outcome Date GA Lbr Len/2nd Weight Sex Delivery Anes PTL Lv  5 Current           4 Term 10/07/18 [redacted]w[redacted]d / 18:20 3496 g F Vag-Spont EPI  LIV  3 IAB 2014          2 Term 12/14/10 [redacted]w[redacted]d  3459 g M Vag-Spont EPI N LIV  1 Gravida             Past Surgical History: Past Surgical History:  Procedure Laterality Date  . THERAPEUTIC ABORTION      Family History: Family History  Problem Relation Age of Onset  . Cancer Mother        Breast Cancer   . Depression Father     Social History: Social History   Tobacco Use  . Smoking status: Never Smoker  . Smokeless tobacco: Never Used  Vaping Use  . Vaping Use: Never used  Substance Use Topics  . Alcohol use: Not Currently    Comment: occ  . Drug use: No    Allergies: No Known Allergies  Meds:  Medications Prior to Admission  Medication Sig  Dispense Refill Last Dose  . ferrous sulfate 325 (65 FE) MG tablet Take 325 mg by mouth daily with breakfast.   11/27/2020 at Unknown time  . ondansetron (ZOFRAN ODT) 4 MG disintegrating tablet Take 1 tablet (4 mg total) by mouth every 6 (six) hours as needed for nausea. 20 tablet 0   . prenatal vitamin w/FE, FA (NATACHEW) 29-1 MG CHEW chewable tablet Chew 1 tablet by mouth daily at 12 noon. (Patient not taking: No sig reported)   11/26/2020  . pyridOXINE (VITAMIN B-6) 50 MG tablet Take 1 tablet (50 mg total) by mouth daily. 30 tablet 1     ROS:  Review of Systems  Constitutional: Positive for appetite change and fatigue. Negative for chills and fever.  HENT: Positive for congestion and sore throat.   Eyes: Negative for photophobia and visual disturbance.  Respiratory: Positive for cough. Negative for chest tightness, shortness of breath and wheezing.   Cardiovascular: Negative for chest pain.  Gastrointestinal: Negative for abdominal pain, constipation, diarrhea, nausea and vomiting.  Genitourinary: Negative for dysuria, vaginal bleeding, vaginal discharge and vaginal pain.  Musculoskeletal: Positive for myalgias. Negative for back pain.  Neurological: Positive  for headaches. Negative for dizziness and light-headedness.   I have reviewed patient's Past Medical Hx, Surgical Hx, Family Hx, Social Hx, medications and allergies.   Physical Exam   Patient Vitals for the past 24 hrs:  BP Temp Pulse Resp SpO2 Height Weight  11/28/20 2335 -- -- -- -- 99 % -- --  11/28/20 2308 120/79 98.7 F (37.1 C) (!) 104 17 97 % 5\' 7"  (1.702 m) 76.2 kg   Constitutional: Well-developed, well-nourished female in no acute distress.  HEENT: audible congestion.  Cardiovascular: slightly tachycardic Respiratory: normal effort GI: Abd soft, non-tender, gravid appropriate for gestational age.  MS: Extremities nontender, no edema, normal ROM Neurologic: Alert and oriented x 4.  GU: Neg CVAT.  PELVIC EXAM:  deferred  FHT:  Baseline 150, moderate variability, multiple 10x10 accelerations present, intermittent variable decels appropriate for gestational age Contractions: none   Labs: Results for orders placed or performed during the hospital encounter of 11/28/20 (from the past 24 hour(s))  Urinalysis, Routine w reflex microscopic Urine, Clean Catch     Status: None   Collection Time: 11/28/20 11:16 PM  Result Value Ref Range   Color, Urine YELLOW YELLOW   APPearance CLEAR CLEAR   Specific Gravity, Urine 1.012 1.005 - 1.030   pH 6.0 5.0 - 8.0   Glucose, UA NEGATIVE NEGATIVE mg/dL   Hgb urine dipstick NEGATIVE NEGATIVE   Bilirubin Urine NEGATIVE NEGATIVE   Ketones, ur NEGATIVE NEGATIVE mg/dL   Protein, ur NEGATIVE NEGATIVE mg/dL   Nitrite NEGATIVE NEGATIVE   Leukocytes,Ua NEGATIVE NEGATIVE  Resp Panel by RT-PCR (Flu A&B, Covid) Nasopharyngeal Swab     Status: Abnormal   Collection Time: 11/28/20 11:33 PM   Specimen: Nasopharyngeal Swab; Nasopharyngeal(NP) swabs in vial transport medium  Result Value Ref Range   SARS Coronavirus 2 by RT PCR NEGATIVE NEGATIVE   Influenza A by PCR POSITIVE (A) NEGATIVE   Influenza B by PCR NEGATIVE NEGATIVE      Imaging:  No results found.  MAU Course/MDM: Orders Placed This Encounter  Procedures  . Resp Panel by RT-PCR (Flu A&B, Covid) Nasopharyngeal Swab  . Urinalysis, Routine w reflex microscopic Urine, Clean Catch  . Airborne and Contact precautions  . Discharge patient Discharge disposition: 01-Home or Self Care; Discharge patient date: 11/29/2020    Meds ordered this encounter  Medications  . lactated ringers bolus 1,000 mL  . oseltamivir (TAMIFLU) 75 MG capsule    Sig: Take 1 capsule (75 mg total) by mouth 2 (two) times daily for 5 days.    Dispense:  10 capsule    Refill:  0     NST reviewed and reactive. Treatments in MAU included IVF bolus.   Pt discharge with strict return precautions for difficulty breathing, shortness of breath,  inability to tolerate fluids by mouth, decreased fetal movement, vaginal bleeding, leakage of fluid, or signs of preterm labor.  Assessment: 1. Influenza A: Pt presents with concern of HA, congestion, decreased appetite and myalgias in the setting of her son recently testing positive for influenza. Reassuringly, SOB, chest pain or other concerning symptoms. Reactive NST in MAU today. Pt's rapid swab resulted negative for COVID and positive for Influenza A. -tamiflu 75mg  BID x5 days -recommended nasal saline, honey with warm drinks, tylenol as needed for symptom relief -provided strict return precautions as noted above -instructed pt to call her clinic to reschedule her glucola test given contagious for 7 days s/p symptom onset   Plan: Discharge home with plan to  follow-up with prenatal provider as previously scheduled. Labor precautions and fetal kick counts.  Allergies as of 11/29/2020   No Known Allergies     Medication List    TAKE these medications   ferrous sulfate 325 (65 FE) MG tablet Take 325 mg by mouth daily with breakfast.   ondansetron 4 MG disintegrating tablet Commonly known as: Zofran ODT Take 1 tablet (4 mg total) by mouth every 6 (six) hours as needed for nausea.   oseltamivir 75 MG capsule Commonly known as: Tamiflu Take 1 capsule (75 mg total) by mouth 2 (two) times daily for 5 days.   prenatal vitamin w/FE, FA 29-1 MG Chew chewable tablet Chew 1 tablet by mouth daily at 12 noon.   pyridOXINE 50 MG tablet Commonly known as: VITAMIN B-6 Take 1 tablet (50 mg total) by mouth daily.     Sheila Oats, MD OB Fellow, Faculty Practice 11/29/2020 1:13 AM

## 2020-12-16 LAB — OB RESULTS CONSOLE HIV ANTIBODY (ROUTINE TESTING): HIV: NONREACTIVE

## 2021-01-14 ENCOUNTER — Encounter (HOSPITAL_COMMUNITY): Payer: Self-pay | Admitting: Obstetrics & Gynecology

## 2021-01-14 ENCOUNTER — Inpatient Hospital Stay (HOSPITAL_COMMUNITY)
Admission: AD | Admit: 2021-01-14 | Discharge: 2021-01-14 | Disposition: A | Discharge status: Home / Self Care | Payer: Medicaid Other | Attending: Obstetrics & Gynecology | Admitting: Obstetrics & Gynecology

## 2021-01-14 ENCOUNTER — Other Ambulatory Visit: Payer: Self-pay

## 2021-01-14 DIAGNOSIS — O99119 Other diseases of the blood and blood-forming organs and certain disorders involving the immune mechanism complicating pregnancy, unspecified trimester: Secondary | ICD-10-CM

## 2021-01-14 DIAGNOSIS — K529 Noninfective gastroenteritis and colitis, unspecified: Secondary | ICD-10-CM | POA: Diagnosis not present

## 2021-01-14 DIAGNOSIS — O218 Other vomiting complicating pregnancy: Secondary | ICD-10-CM | POA: Insufficient documentation

## 2021-01-14 DIAGNOSIS — A084 Viral intestinal infection, unspecified: Secondary | ICD-10-CM | POA: Diagnosis not present

## 2021-01-14 DIAGNOSIS — O99113 Other diseases of the blood and blood-forming organs and certain disorders involving the immune mechanism complicating pregnancy, third trimester: Secondary | ICD-10-CM | POA: Diagnosis not present

## 2021-01-14 DIAGNOSIS — D696 Thrombocytopenia, unspecified: Secondary | ICD-10-CM | POA: Diagnosis not present

## 2021-01-14 DIAGNOSIS — O99613 Diseases of the digestive system complicating pregnancy, third trimester: Secondary | ICD-10-CM | POA: Diagnosis not present

## 2021-01-14 DIAGNOSIS — Z3A31 31 weeks gestation of pregnancy: Secondary | ICD-10-CM | POA: Diagnosis not present

## 2021-01-14 DIAGNOSIS — Z3689 Encounter for other specified antenatal screening: Secondary | ICD-10-CM

## 2021-01-14 DIAGNOSIS — R5383 Other fatigue: Secondary | ICD-10-CM | POA: Diagnosis not present

## 2021-01-14 LAB — URINALYSIS, ROUTINE W REFLEX MICROSCOPIC
Bilirubin Urine: NEGATIVE
Glucose, UA: NEGATIVE mg/dL
Hgb urine dipstick: NEGATIVE
Ketones, ur: 5 mg/dL — AB
Leukocytes,Ua: NEGATIVE
Nitrite: NEGATIVE
Protein, ur: 30 mg/dL — AB
Specific Gravity, Urine: 1.027 (ref 1.005–1.030)
pH: 6 (ref 5.0–8.0)

## 2021-01-14 LAB — BASIC METABOLIC PANEL
Anion gap: 7 (ref 5–15)
BUN: 11 mg/dL (ref 6–20)
CO2: 20 mmol/L — ABNORMAL LOW (ref 22–32)
Calcium: 8.3 mg/dL — ABNORMAL LOW (ref 8.9–10.3)
Chloride: 107 mmol/L (ref 98–111)
Creatinine, Ser: 0.62 mg/dL (ref 0.44–1.00)
GFR, Estimated: >60 mL/min (ref >60)
Glucose, Bld: 79 mg/dL (ref 70–99)
Potassium: 3.5 mmol/L (ref 3.5–5.1)
Sodium: 134 mmol/L — ABNORMAL LOW (ref 135–145)

## 2021-01-14 LAB — CBC
HCT: 36.2 % (ref 36.0–46.0)
Hemoglobin: 12.1 g/dL (ref 12.0–15.0)
MCH: 29.7 pg (ref 26.0–34.0)
MCHC: 33.4 g/dL (ref 30.0–36.0)
MCV: 88.7 fL (ref 80.0–100.0)
Platelets: 131 10*3/uL — ABNORMAL LOW (ref 150–400)
RBC: 4.08 MIL/uL (ref 3.87–5.11)
RDW: 15.1 % (ref 11.5–15.5)
WBC: 11 10*3/uL — ABNORMAL HIGH (ref 4.0–10.5)
nRBC: 0 % (ref 0.0–0.2)

## 2021-01-14 MED ORDER — LACTATED RINGERS IV BOLUS
1000.0000 mL | Freq: Once | INTRAVENOUS | Status: AC
  Administered 2021-01-14: 1000 mL via INTRAVENOUS

## 2021-01-14 MED ORDER — SODIUM CHLORIDE 0.9 % IV SOLN
8.0000 mg | Freq: Once | INTRAVENOUS | Status: AC
  Administered 2021-01-14: 8 mg via INTRAVENOUS
  Filled 2021-01-14: qty 4

## 2021-01-14 MED ORDER — ONDANSETRON 4 MG PO TBDP: 4.0000 mg | ORAL_TABLET | Freq: Three times a day (TID) | ORAL | 0 refills | Status: DC | PRN

## 2021-01-14 MED ORDER — LOPERAMIDE HCL 2 MG PO CAPS: 2.0000 mg | ORAL_CAPSULE | Freq: Four times a day (QID) | ORAL | 0 refills | Status: DC | PRN

## 2021-01-14 NOTE — MAU Note (Signed)
Pt able to tolerate crackers and ginger ale.  

## 2021-01-14 NOTE — MAU Note (Signed)
Theresa Winters is a 35 y.o. at [redacted]w[redacted]d here in MAU reporting: pt has been nauseous and vomiting since 0300 this morning (01/14/21). Pt states she had an egg, bacon, and cheese biscuit on Sunday (01/12/21), that "taste funny". She was also burping up "raw egg". She states she attempts to drink water and she either throws it up or has an episode of diarrhea. Pt denies pain, LOF and VB.   Onset of complaint: 01/14/21 Pain score: 0/10 Vitals:   01/14/21 1711  BP: 107/69  Pulse: 82  Resp: 18  SpO2: 99%     FHT:140 Lab orders placed from triage: UA

## 2021-01-14 NOTE — MAU Provider Note (Signed)
History     CSN: [id number]  Arrival date and time: 01/14/21 1645   Event Date/Time   First Provider Initiated Contact with Patient 01/14/21 1747      Chief Complaint  Patient presents with  . Nausea  . Emesis  . Fatigue   35 y.o. B2I2035 @33 .5 wks presenting with N/V/D. Reports onset around 3am. She is able to tolerate some fluids but each time she drinks it causes diarrhea. Had multiple episodes. She took Zofran around noon but vomited the pill back up. Reports her son had diarrhea a few days ago. Denies fevers. Reports good FM. No VB, LOF, ctx.   OB History    Gravida  5   Para  2   Term  2   Preterm  0   AB  1   Living  2     SAB  0   IAB  1   Ectopic  0   Multiple  0   Live Births  2           Past Medical History:  Diagnosis Date  . Anemia    with pregnancy  . Dyspnea   . Medical history non-contributory   . Scoliosis   . Scoliosis     Past Surgical History:  Procedure Laterality Date  . THERAPEUTIC ABORTION      Family History  Problem Relation Age of Onset  . Cancer Mother        Breast Cancer   . Depression Father     Social History   Tobacco Use  . Smoking status: Never Smoker  . Smokeless tobacco: Never Used  Vaping Use  . Vaping Use: Never used  Substance Use Topics  . Alcohol use: Not Currently    Comment: occ  . Drug use: No    Allergies: No Known Allergies  No medications prior to admission.    Review of Systems  Constitutional: Negative for fever.  Gastrointestinal: Positive for diarrhea, nausea and vomiting.  Genitourinary: Negative for vaginal bleeding and vaginal discharge.   Physical Exam   Blood pressure 114/69, pulse 74, temperature 98.5 F (36.9 C), resp. rate 16, height 5\' 7"  (1.702 m), weight 78.5 kg, last menstrual period 06/06/2020, SpO2 100 %, unknown if currently breastfeeding.  Physical Exam Vitals and nursing note reviewed.  Constitutional:      Appearance: Normal appearance.  HENT:      Head: Normocephalic and atraumatic.  Pulmonary:     Effort: Pulmonary effort is normal.  Abdominal:     Palpations: Abdomen is soft.     Tenderness: There is no abdominal tenderness.     Comments: gravid  Musculoskeletal:        General: Normal range of motion.     Cervical back: Normal range of motion.  Skin:    General: Skin is warm and dry.  Neurological:     General: No focal deficit present.     Mental Status: She is alert and oriented to person, place, and time.  Psychiatric:        Mood and Affect: Mood normal.        Behavior: Behavior normal.   EFM: 140 bpm, mod variability, + accels, no decels Toco: none  Results for orders placed or performed during the hospital encounter of 01/14/21 (from the past 24 hour(s))  Urinalysis, Routine w reflex microscopic Urine, Clean Catch     Status: Abnormal   Collection Time: 01/14/21  5:28 PM  Result Value Ref Range  Color, Urine AMBER (A) YELLOW   APPearance HAZY (A) CLEAR   Specific Gravity, Urine 1.027 1.005 - 1.030   pH 6.0 5.0 - 8.0   Glucose, UA NEGATIVE NEGATIVE mg/dL   Hgb urine dipstick NEGATIVE NEGATIVE   Bilirubin Urine NEGATIVE NEGATIVE   Ketones, ur 5 (A) NEGATIVE mg/dL   Protein, ur 30 (A) NEGATIVE mg/dL   Nitrite NEGATIVE NEGATIVE   Leukocytes,Ua NEGATIVE NEGATIVE   RBC / HPF 0-5 0 - 5 RBC/hpf   WBC, UA 0-5 0 - 5 WBC/hpf   Bacteria, UA FEW (A) NONE SEEN   Squamous Epithelial / LPF 0-5 0 - 5   Mucus PRESENT   Basic metabolic panel     Status: Abnormal   Collection Time: 01/14/21  7:19 PM  Result Value Ref Range   Sodium 134 (L) 135 - 145 mmol/L   Potassium 3.5 3.5 - 5.1 mmol/L   Chloride 107 98 - 111 mmol/L   CO2 20 (L) 22 - 32 mmol/L   Glucose, Bld 79 70 - 99 mg/dL   BUN 11 6 - 20 mg/dL   Creatinine, Ser 7.62 0.44 - 1.00 mg/dL   Calcium 8.3 (L) 8.9 - 10.3 mg/dL   GFR, Estimated >83 >15 mL/min   Anion gap 7 5 - 15  CBC     Status: Abnormal   Collection Time: 01/14/21  7:19 PM  Result Value Ref  Range   WBC 11.0 (H) 4.0 - 10.5 K/uL   RBC 4.08 3.87 - 5.11 MIL/uL   Hemoglobin 12.1 12.0 - 15.0 g/dL   HCT 17.6 16.0 - 73.7 %   MCV 88.7 80.0 - 100.0 fL   MCH 29.7 26.0 - 34.0 pg   MCHC 33.4 30.0 - 36.0 g/dL   RDW 10.6 26.9 - 48.5 %   Platelets 131 (L) 150 - 400 K/uL   nRBC 0.0 0.0 - 0.2 %   MAU Course  Procedures LR Zofran  MDM Labs ordered and reviewed. Feeling better after fluids and meds. No further emesis or diarrhea. Tolerating po fluids and crackers. Stable for discharge home.   Assessment and Plan   1. [redacted] weeks gestation of pregnancy   2. NST (non-stress test) reactive   3. Viral gastroenteritis   4. Thrombocytopenia affecting pregnancy (HCC)    Discharge home Follow up at [facility] as scheduled Rx Zofran Rx Imodium  Allergies as of 01/14/2021   No Known Allergies     Medication List    TAKE these medications   ferrous sulfate 325 (65 FE) MG tablet Take 325 mg by mouth daily with breakfast.   loperamide 2 MG capsule Commonly known as: IMODIUM Take 1 capsule (2 mg total) by mouth 4 (four) times daily as needed for diarrhea or loose stools.   ondansetron 4 MG disintegrating tablet Commonly known as: Zofran ODT Take 1 tablet (4 mg total) by mouth every 8 (eight) hours as needed for nausea or vomiting. What changed:   when to take this  reasons to take this   prenatal vitamin w/FE, FA 29-1 MG Chew chewable tablet Chew 1 tablet by mouth daily at 12 noon.   pyridOXINE 50 MG tablet Commonly known as: VITAMIN B-6 Take 1 tablet (50 mg total) by mouth daily.      Donette Larry, CNM 01/14/2021, 9:29 PM

## 2021-01-14 NOTE — Discharge Instructions (Signed)
Viral Gastroenteritis, Adult  Viral gastroenteritis is also known as the stomach flu. This condition may affect your stomach, small intestine, and large intestine. It can cause sudden watery diarrhea, fever, and vomiting. This condition is caused by many different viruses. These viruses can be passed from person to person very easily (are contagious). Diarrhea and vomiting can make you feel weak and cause you to become dehydrated. You may not be able to keep fluids down. Dehydration can make you tired and thirsty, cause you to have a dry mouth, and decrease how often you urinate. It is important to replace the fluids that you lose from diarrhea and vomiting. What are the causes? Gastroenteritis is caused by many viruses, including rotavirus and norovirus. Norovirus is the most common cause in adults. You can get sick after being exposed to the viruses from other people. You can also get sick by:  Eating food, drinking water, or touching a surface contaminated with one of these viruses.  Sharing utensils or other personal items with an infected person. What increases the risk? You are more likely to develop this condition if you:  Have a weak body defense system (immune system).  Live with one or more children who are younger than 2 years old.  Live in a nursing home.  Travel on cruise ships. What are the signs or symptoms? Symptoms of this condition start suddenly 1-3 days after exposure to a virus. Symptoms may last for a few days or for as long as a week. Common symptoms include watery diarrhea and vomiting. Other symptoms include:  Fever.  Headache.  Fatigue.  Pain in the abdomen.  Chills.  Weakness.  Nausea.  Muscle aches.  Loss of appetite. How is this diagnosed? This condition is diagnosed with a medical history and physical exam. You may also have a stool test to check for viruses or other infections. How is this treated? This condition typically goes away on its  own. The focus of treatment is to prevent dehydration and restore lost fluids (rehydration). This condition may be treated with:  An oral rehydration solution (ORS) to replace important salts and minerals (electrolytes) in your body. Take this if told by your health care provider. This is a drink that is sold at pharmacies and retail stores.  Medicines to help with your symptoms.  Probiotic supplements to reduce symptoms of diarrhea.  Fluids given through an IV, if dehydration is severe. Older adults and people with other diseases or a weak immune system are at higher risk for dehydration. Follow these instructions at home: Eating and drinking  Take an ORS as told by your health care provider.  Drink clear fluids in small amounts as you are able. Clear fluids include: ? Water. ? Ice chips. ? Diluted fruit juice. ? Low-calorie sports drinks.  Drink enough fluid to keep your urine pale yellow.  Eat small amounts of healthy foods every 3-4 hours as you are able. This may include whole grains, fruits, vegetables, lean meats, and yogurt.  Avoid fluids that contain a lot of sugar or caffeine, such as energy drinks, sports drinks, and soda.  Avoid spicy or fatty foods.  Avoid alcohol.   General instructions  Wash your hands often, especially after having diarrhea or vomiting. If soap and water are not available, use hand sanitizer.  Make sure that all people in your household wash their hands well and often.  Take over-the-counter and prescription medicines only as told by your health care provider.  Rest at   home while you recover.  Watch your condition for any changes.  Take a warm bath to relieve any burning or pain from frequent diarrhea episodes.  Keep all follow-up visits as told by your health care provider. This is important.   Contact a health care provider if you:  Cannot keep fluids down.  Have symptoms that get worse.  Have new symptoms.  Feel light-headed or  dizzy.  Have muscle cramps. Get help right away if you:  Have chest pain.  Feel extremely weak or you faint.  See blood in your vomit.  Have vomit that looks like coffee grounds.  Have bloody or black stools or stools that look like tar.  Have a severe headache, a stiff neck, or both.  Have a rash.  Have severe pain, cramping, or bloating in your abdomen.  Have trouble breathing or you are breathing very quickly.  Have a fast heartbeat.  Have skin that feels cold and clammy.  Feel confused.  Have pain when you urinate.  Have signs of dehydration, such as: ? Dark urine, very little urine, or no urine. ? Cracked lips. ? Dry mouth. ? Sunken eyes. ? Sleepiness. ? Weakness. Summary  Viral gastroenteritis is also known as the stomach flu. It can cause sudden watery diarrhea, fever, and vomiting.  This condition can be passed from person to person very easily (is contagious).  Take an ORS if told by your health care provider. This is a drink that is sold at pharmacies and retail stores.  Wash your hands often, especially after having diarrhea or vomiting. If soap and water are not available, use hand sanitizer. This information is not intended to replace advice given to you by your health care provider. Make sure you discuss any questions you have with your health care provider. Document Revised: 01/27/2019 Document Reviewed: 06/15/2018 Elsevier Patient Education  2021 [employer] Inc[employer]  

## 2021-02-05 ENCOUNTER — Inpatient Hospital Stay (HOSPITAL_COMMUNITY)
Admission: AD | Admit: 2021-02-05 | Discharge: 2021-02-05 | Disposition: A | Discharge status: Home / Self Care | Payer: Medicaid Other | Attending: Obstetrics & Gynecology | Admitting: Obstetrics & Gynecology

## 2021-02-05 ENCOUNTER — Encounter (HOSPITAL_COMMUNITY): Payer: Self-pay | Admitting: Obstetrics & Gynecology

## 2021-02-05 ENCOUNTER — Other Ambulatory Visit: Payer: Self-pay

## 2021-02-05 DIAGNOSIS — N9489 Other specified conditions associated with female genital organs and menstrual cycle: Secondary | ICD-10-CM | POA: Diagnosis not present

## 2021-02-05 DIAGNOSIS — Z0371 Encounter for suspected problem with amniotic cavity and membrane ruled out: Secondary | ICD-10-CM | POA: Insufficient documentation

## 2021-02-05 DIAGNOSIS — O98813 Other maternal infectious and parasitic diseases complicating pregnancy, third trimester: Secondary | ICD-10-CM | POA: Insufficient documentation

## 2021-02-05 DIAGNOSIS — K5909 Other constipation: Secondary | ICD-10-CM | POA: Insufficient documentation

## 2021-02-05 DIAGNOSIS — B3731 Acute candidiasis of vulva and vagina: Secondary | ICD-10-CM

## 2021-02-05 DIAGNOSIS — Z3689 Encounter for other specified antenatal screening: Secondary | ICD-10-CM

## 2021-02-05 DIAGNOSIS — O26893 Other specified pregnancy related conditions, third trimester: Secondary | ICD-10-CM

## 2021-02-05 DIAGNOSIS — R102 Pelvic and perineal pain: Secondary | ICD-10-CM | POA: Insufficient documentation

## 2021-02-05 DIAGNOSIS — O99613 Diseases of the digestive system complicating pregnancy, third trimester: Secondary | ICD-10-CM | POA: Insufficient documentation

## 2021-02-05 DIAGNOSIS — B373 Candidiasis of vulva and vagina: Secondary | ICD-10-CM | POA: Diagnosis not present

## 2021-02-05 DIAGNOSIS — Z3A34 34 weeks gestation of pregnancy: Secondary | ICD-10-CM | POA: Insufficient documentation

## 2021-02-05 LAB — URINALYSIS, ROUTINE W REFLEX MICROSCOPIC
Bacteria, UA: NONE SEEN
Bilirubin Urine: NEGATIVE
Glucose, UA: 50 mg/dL — AB
Hgb urine dipstick: NEGATIVE
Ketones, ur: 5 mg/dL — AB
Leukocytes,Ua: NEGATIVE
Nitrite: NEGATIVE
Protein, ur: 30 mg/dL — AB
Specific Gravity, Urine: 1.027 (ref 1.005–1.030)
pH: 6 (ref 5.0–8.0)

## 2021-02-05 LAB — WET PREP, GENITAL
Clue Cells Wet Prep HPF POC: NONE SEEN
Sperm: NONE SEEN
Trich, Wet Prep: NONE SEEN

## 2021-02-05 LAB — AMNISURE RUPTURE OF MEMBRANE (ROM) NOT AT ARMC: Amnisure ROM: NEGATIVE

## 2021-02-05 MED ORDER — POLYETHYLENE GLYCOL 3350 17 G PO PACK: 17.0000 g | PACK | Freq: Every day | ORAL | 0 refills | Status: DC

## 2021-02-05 MED ORDER — DOCUSATE SODIUM 100 MG PO CAPS: 100.0000 mg | ORAL_CAPSULE | Freq: Two times a day (BID) | ORAL | 2 refills | Status: DC | PRN

## 2021-02-05 MED ORDER — TERCONAZOLE 0.4 % VA CREA: 1.0000 | TOPICAL_CREAM | Freq: Every day | VAGINAL | 0 refills | Status: DC

## 2021-02-05 NOTE — MAU Provider Note (Signed)
History     CSN: [id number]  Arrival date and time: 02/05/21 1751   Event Date/Time   First Provider Initiated Contact with Patient 02/05/21 1924      Chief Complaint  Patient presents with   Pelvic Pain   Vaginal Discharge   HPI Theresa Winters is a 35 y.o. C1E7517 at [redacted]w[redacted]d who presents to MAU with the following complaints:   Leaking of fluid This is a new problem, onset today. Patient says she saw "jelly like mucus" come out of her vagina.   Pelvic pain This is a new problem, onset after passing of abnormal vaginal discharge. Patient endorses bilateral lower abdominal pain. Pain score 6/10. Pain improves but does not completely resolve at rest. Pain intensifies with movement, prolonged standing or walking. She has not taken medication or tried other treatments for this complaint.   Constipation This is a recurrent problem. Patient states she has chronic constipation which has been worsened by her PO Iron tablets. Most recent bowel movement yesterday. She has not taken medication or tried other treatments for this complaint.  She denies vaginal bleeding, abdominal pain, decreased fetal movement, fever, falls, or recent illness.    OB History     Gravida  5   Para  2   Term  2   Preterm  0   AB  1   Living  2      SAB  0   IAB  1   Ectopic  0   Multiple  0   Live Births  2           Past Medical History:  Diagnosis Date   Anemia    with pregnancy   Dyspnea    Medical history non-contributory    Scoliosis    Scoliosis     Past Surgical History:  Procedure Laterality Date   THERAPEUTIC ABORTION      Family History  Problem Relation Age of Onset   Cancer Mother        Breast Cancer    Depression Father     Social History   Tobacco Use   Smoking status: Never   Smokeless tobacco: Never  Vaping Use   Vaping Use: Never used  Substance Use Topics   Alcohol use: Not Currently    Comment: occ   Drug use: No    Allergies: No  Known Allergies  Medications Prior to Admission  Medication Sig Dispense Refill Last Dose   ferrous sulfate 325 (65 FE) MG tablet Take 325 mg by mouth daily with breakfast.      loperamide (IMODIUM) 2 MG capsule Take 1 capsule (2 mg total) by mouth 4 (four) times daily as needed for diarrhea or loose stools. 12 capsule 0    ondansetron (ZOFRAN ODT) 4 MG disintegrating tablet Take 1 tablet (4 mg total) by mouth every 8 (eight) hours as needed for nausea or vomiting. 20 tablet 0    prenatal vitamin w/FE, FA (NATACHEW) 29-1 MG CHEW chewable tablet Chew 1 tablet by mouth daily at 12 noon. (Patient not taking: No sig reported)      pyridOXINE (VITAMIN B-6) 50 MG tablet Take 1 tablet (50 mg total) by mouth daily. (Patient not taking: Reported on 01/14/2021) 30 tablet 1     Review of Systems  Constitutional:  Positive for fatigue.  Genitourinary:  Positive for pelvic pain and vaginal discharge.  All other systems reviewed and are negative. Physical Exam   Blood pressure 122/81, pulse 87, temperature  98.7 F (37.1 C), temperature source Oral, resp. rate 16, last menstrual period 06/06/2020, SpO2 100 %, unknown if currently breastfeeding.  Physical Exam Vitals and nursing note reviewed. Exam conducted with a chaperone present.  Constitutional:      General: She is not in acute distress.    Appearance: Normal appearance. She is not ill-appearing.  Cardiovascular:     Rate and Rhythm: Normal rate.     Pulses: Normal pulses.     Heart sounds: Normal heart sounds.  Pulmonary:     Effort: Pulmonary effort is normal.     Breath sounds: Normal breath sounds.  Abdominal:     Comments: Gravid  Genitourinary:    Comments: Pelvic exam: External genitalia normal, vaginal walls pink and well rugated, cervix visually closed, no lesions noted. Thick clusters of white discharge noted throughout vault. C/w yeast   Skin:    Capillary Refill: Capillary refill takes less than 2 seconds.  Neurological:      Mental Status: She is alert and oriented to person, place, and time.    MAU Course  Procedures: sterile speculum exam  --Tracing reactive after repositioning to left lateral and consuming apple juice.  --Baseline 135, mod var, + 15 x 15 accels, no decels --Toco: quiet --Intact membranes: negative pooling, negative fern, negative Amnisure  Orders Placed This Encounter  Procedures   Wet prep, genital   Urinalysis, Routine w reflex microscopic Urine, Clean Catch   Amnisure rupture of membrane (rom)not at [facility]   Discharge patient   Patient Vitals for the past 24 hrs:  BP Temp Temp src Pulse Resp SpO2  02/05/21 1936 113/74 -- -- 75 -- --  02/05/21 1819 122/81 98.7 F (37.1 C) Oral 87 16 100 %   Results for orders placed or performed during the hospital encounter of 02/05/21 (from the past 24 hour(s))  Urinalysis, Routine w reflex microscopic Urine, Clean Catch     Status: Abnormal   Collection Time: 02/05/21  6:31 PM  Result Value Ref Range   Color, Urine YELLOW YELLOW   APPearance CLEAR CLEAR   Specific Gravity, Urine 1.027 1.005 - 1.030   pH 6.0 5.0 - 8.0   Glucose, UA 50 (A) NEGATIVE mg/dL   Hgb urine dipstick NEGATIVE NEGATIVE   Bilirubin Urine NEGATIVE NEGATIVE   Ketones, ur 5 (A) NEGATIVE mg/dL   Protein, ur 30 (A) NEGATIVE mg/dL   Nitrite NEGATIVE NEGATIVE   Leukocytes,Ua NEGATIVE NEGATIVE   RBC / HPF 0-5 0 - 5 RBC/hpf   WBC, UA 0-5 0 - 5 WBC/hpf   Bacteria, UA NONE SEEN NONE SEEN   Squamous Epithelial / LPF 0-5 0 - 5   Mucus PRESENT   Amnisure rupture of membrane (rom)not at [facility]     Status: None   Collection Time: 02/05/21  6:59 PM  Result Value Ref Range   Amnisure ROM NEGATIVE   Wet prep, genital     Status: Abnormal   Collection Time: 02/05/21  7:01 PM  Result Value Ref Range   Yeast Wet Prep HPF POC PRESENT (A) NONE SEEN   Trich, Wet Prep NONE SEEN NONE SEEN   Clue Cells Wet Prep HPF POC NONE SEEN NONE SEEN   WBC, Wet Prep HPF POC FEW (A) NONE SEEN    Sperm NONE SEEN    Meds ordered this encounter  Medications   polyethylene glycol (MIRALAX) 17 g packet    Sig: Take 17 g by mouth daily.    Dispense:  14  each    Refill:  0    Order Specific Question:   Supervising Provider    Answer:   Jaynie Collins A [[id number]]   docusate sodium (COLACE) 100 MG capsule    Sig: Take 1 capsule (100 mg total) by mouth 2 (two) times daily as needed.    Dispense:  30 capsule    Refill:  2    Order Specific Question:   Supervising Provider    Answer:   Jaynie Collins A [[id number]]   terconazole (TERAZOL 7) 0.4 % vaginal cream    Sig: Place 1 applicator vaginally at bedtime. Use for seven days    Dispense:  45 g    Refill:  0    Order Specific Question:   Supervising Provider    Answer:   Jaynie Collins A [[id number]]    Assessment and Plan  --35 y.o. G2I9485 at [redacted]w[redacted]d  --Reactive tracing, closed cervix --Intact amniotic sac --Vulvovaginal Candidiasis --Round ligament pain, consider maternity belt, Tylenol PRN --New regimen for management of constipation --Discharge home in stable condition  F/U: Next appointment with [facility] is 06/23  Calvert Cantor, CNM 02/05/2021, 8:30 PM

## 2021-02-05 NOTE — MAU Note (Signed)
Theresa Winters is a 35 y.o. at [redacted]w[redacted]d here in MAU reporting: today she saw some jelly stuff come out and then shortly after that she started having pelvic pain. States pain is mostly when she is walking. Also feels like baby is lower in her abdomen. + FM. No bleeding, no LOF.  Onset of complaint: today  Pain score: 6/10  Vitals:   02/05/21 1819  BP: 122/81  Pulse: 87  Resp: 16  Temp: 98.7 F (37.1 C)  SpO2: 100%     FHT:EFM applied in room  Lab orders placed from triage: UA

## 2021-02-13 LAB — OB RESULTS CONSOLE GBS: GBS: POSITIVE

## 2021-02-27 ENCOUNTER — Other Ambulatory Visit: Payer: Self-pay | Admitting: Obstetrics & Gynecology

## 2021-02-28 LAB — OB RESULTS CONSOLE GC/CHLAMYDIA
Chlamydia: NEGATIVE
Gonorrhea: NEGATIVE

## 2021-03-04 ENCOUNTER — Telehealth (HOSPITAL_COMMUNITY): Payer: Self-pay | Admitting: *Deleted

## 2021-03-04 ENCOUNTER — Encounter (HOSPITAL_COMMUNITY): Payer: Self-pay | Admitting: *Deleted

## 2021-03-04 NOTE — Telephone Encounter (Signed)
Preadmission screen  

## 2021-03-05 ENCOUNTER — Other Ambulatory Visit (HOSPITAL_COMMUNITY)
Admission: RE | Admit: 2021-03-05 | Discharge: 2021-03-05 | Disposition: A | Discharge status: Home / Self Care | Payer: Medicaid Other | Source: Ambulatory Visit | Attending: Obstetrics & Gynecology | Admitting: Obstetrics & Gynecology

## 2021-03-05 DIAGNOSIS — Z20822 Contact with and (suspected) exposure to covid-19: Secondary | ICD-10-CM | POA: Insufficient documentation

## 2021-03-05 DIAGNOSIS — Z01812 Encounter for preprocedural laboratory examination: Secondary | ICD-10-CM | POA: Insufficient documentation

## 2021-03-05 LAB — SARS CORONAVIRUS 2 (TAT 6-24 HRS): SARS Coronavirus 2: NEGATIVE

## 2021-03-07 ENCOUNTER — Inpatient Hospital Stay (HOSPITAL_COMMUNITY): Payer: Medicaid Other

## 2021-03-07 NOTE — Progress Notes (Signed)
Induction delayed to 03/08/21 provider and patient contacted

## 2021-03-08 ENCOUNTER — Other Ambulatory Visit: Payer: Self-pay

## 2021-03-08 ENCOUNTER — Inpatient Hospital Stay (HOSPITAL_COMMUNITY): Payer: Medicaid Other | Admitting: Anesthesiology

## 2021-03-08 ENCOUNTER — Inpatient Hospital Stay (HOSPITAL_COMMUNITY)
Admission: AD | Admit: 2021-03-08 | Discharge: 2021-03-10 | DRG: 807 | Disposition: A | Discharge status: Home / Self Care | Payer: Medicaid Other | Attending: Obstetrics & Gynecology | Admitting: Obstetrics & Gynecology

## 2021-03-08 ENCOUNTER — Encounter (HOSPITAL_COMMUNITY): Payer: Self-pay | Admitting: Obstetrics & Gynecology

## 2021-03-08 DIAGNOSIS — Z3A39 39 weeks gestation of pregnancy: Secondary | ICD-10-CM

## 2021-03-08 DIAGNOSIS — O99824 Streptococcus B carrier state complicating childbirth: Principal | ICD-10-CM | POA: Diagnosis present

## 2021-03-08 DIAGNOSIS — O9912 Other diseases of the blood and blood-forming organs and certain disorders involving the immune mechanism complicating childbirth: Secondary | ICD-10-CM | POA: Diagnosis present

## 2021-03-08 DIAGNOSIS — O9902 Anemia complicating childbirth: Secondary | ICD-10-CM | POA: Diagnosis present

## 2021-03-08 DIAGNOSIS — R002 Palpitations: Secondary | ICD-10-CM | POA: Diagnosis not present

## 2021-03-08 DIAGNOSIS — Z3490 Encounter for supervision of normal pregnancy, unspecified, unspecified trimester: Secondary | ICD-10-CM | POA: Diagnosis present

## 2021-03-08 DIAGNOSIS — O99893 Other specified diseases and conditions complicating puerperium: Secondary | ICD-10-CM | POA: Diagnosis not present

## 2021-03-08 DIAGNOSIS — O99892 Other specified diseases and conditions complicating childbirth: Secondary | ICD-10-CM | POA: Diagnosis present

## 2021-03-08 DIAGNOSIS — M419 Scoliosis, unspecified: Secondary | ICD-10-CM | POA: Diagnosis present

## 2021-03-08 DIAGNOSIS — D696 Thrombocytopenia, unspecified: Secondary | ICD-10-CM | POA: Diagnosis present

## 2021-03-08 DIAGNOSIS — D6959 Other secondary thrombocytopenia: Secondary | ICD-10-CM | POA: Diagnosis present

## 2021-03-08 DIAGNOSIS — O26893 Other specified pregnancy related conditions, third trimester: Secondary | ICD-10-CM | POA: Diagnosis present

## 2021-03-08 LAB — TYPE AND SCREEN
ABO/RH(D): B POS
Antibody Screen: NEGATIVE

## 2021-03-08 LAB — CBC
HCT: 35.1 % — ABNORMAL LOW (ref 36.0–46.0)
Hemoglobin: 11.2 g/dL — ABNORMAL LOW (ref 12.0–15.0)
MCH: 28.9 pg (ref 26.0–34.0)
MCHC: 31.9 g/dL (ref 30.0–36.0)
MCV: 90.5 fL (ref 80.0–100.0)
Platelets: 165 10*3/uL (ref 150–400)
RBC: 3.88 MIL/uL (ref 3.87–5.11)
RDW: 14.5 % (ref 11.5–15.5)
WBC: 7.3 10*3/uL (ref 4.0–10.5)
nRBC: 0 % (ref 0.0–0.2)

## 2021-03-08 LAB — RPR: RPR Ser Ql: NONREACTIVE

## 2021-03-08 MED ORDER — LACTATED RINGERS AMNIOINFUSION: INTRAVENOUS | Status: DC

## 2021-03-08 MED ORDER — BUPIVACAINE HCL (PF) 0.25 % IJ SOLN
INTRAMUSCULAR | Status: DC | PRN
  Administered 2021-03-08: 5 mL via EPIDURAL

## 2021-03-08 MED ORDER — OXYCODONE-ACETAMINOPHEN 5-325 MG PO TABS: 2.0000 | ORAL_TABLET | ORAL | Status: DC | PRN

## 2021-03-08 MED ORDER — SODIUM CHLORIDE 0.9 % IV SOLN
5.0000 10*6.[IU] | Freq: Once | INTRAVENOUS | Status: AC
  Administered 2021-03-08: 5 10*6.[IU] via INTRAVENOUS
  Filled 2021-03-08: qty 5

## 2021-03-08 MED ORDER — LACTATED RINGERS IV SOLN: INTRAVENOUS | Status: DC

## 2021-03-08 MED ORDER — SOD CITRATE-CITRIC ACID 500-334 MG/5ML PO SOLN: 30.0000 mL | ORAL | Status: DC | PRN

## 2021-03-08 MED ORDER — DIPHENHYDRAMINE HCL 50 MG/ML IJ SOLN: 12.5000 mg | INTRAMUSCULAR | Status: DC | PRN

## 2021-03-08 MED ORDER — LACTATED RINGERS IV SOLN: 500.0000 mL | Freq: Once | INTRAVENOUS | Status: DC

## 2021-03-08 MED ORDER — FENTANYL-BUPIVACAINE-NACL 0.5-0.125-0.9 MG/250ML-% EP SOLN
12.0000 mL/h | EPIDURAL | Status: DC | PRN
Start: 2021-03-08 — End: 2021-03-09
  Administered 2021-03-08: 12 mL/h via EPIDURAL
  Filled 2021-03-08: qty 250

## 2021-03-08 MED ORDER — OXYCODONE-ACETAMINOPHEN 5-325 MG PO TABS: 1.0000 | ORAL_TABLET | ORAL | Status: DC | PRN

## 2021-03-08 MED ORDER — PENICILLIN G POT IN DEXTROSE 60000 UNIT/ML IV SOLN
3.0000 10*6.[IU] | INTRAVENOUS | Status: DC
  Administered 2021-03-08 (×2): 3 10*6.[IU] via INTRAVENOUS
  Filled 2021-03-08 (×2): qty 50

## 2021-03-08 MED ORDER — LIDOCAINE HCL (PF) 1 % IJ SOLN: 30.0000 mL | INTRAMUSCULAR | Status: DC | PRN

## 2021-03-08 MED ORDER — PHENYLEPHRINE 40 MCG/ML (10ML) SYRINGE FOR IV PUSH (FOR BLOOD PRESSURE SUPPORT)
80.0000 ug | PREFILLED_SYRINGE | INTRAVENOUS | Status: DC | PRN
  Filled 2021-03-08: qty 10

## 2021-03-08 MED ORDER — FLEET ENEMA 7-19 GM/118ML RE ENEM: 1.0000 | ENEMA | RECTAL | Status: DC | PRN

## 2021-03-08 MED ORDER — EPHEDRINE 5 MG/ML INJ: 10.0000 mg | INTRAVENOUS | Status: DC | PRN

## 2021-03-08 MED ORDER — ONDANSETRON HCL 4 MG/2ML IJ SOLN: 4.0000 mg | Freq: Four times a day (QID) | INTRAMUSCULAR | Status: DC | PRN

## 2021-03-08 MED ORDER — OXYTOCIN-SODIUM CHLORIDE 30-0.9 UT/500ML-% IV SOLN: 2.5000 [IU]/h | INTRAVENOUS | Status: DC

## 2021-03-08 MED ORDER — PHENYLEPHRINE 40 MCG/ML (10ML) SYRINGE FOR IV PUSH (FOR BLOOD PRESSURE SUPPORT): 80.0000 ug | PREFILLED_SYRINGE | INTRAVENOUS | Status: DC | PRN

## 2021-03-08 MED ORDER — TERBUTALINE SULFATE 1 MG/ML IJ SOLN: 0.2500 mg | Freq: Once | INTRAMUSCULAR | Status: DC | PRN

## 2021-03-08 MED ORDER — LACTATED RINGERS IV SOLN: 500.0000 mL | INTRAVENOUS | Status: DC | PRN

## 2021-03-08 MED ORDER — LIDOCAINE HCL (PF) 1 % IJ SOLN
INTRAMUSCULAR | Status: DC | PRN
  Administered 2021-03-08: 5 mL via EPIDURAL
  Administered 2021-03-08: 3 mL via EPIDURAL
  Administered 2021-03-08: 2 mL via EPIDURAL

## 2021-03-08 MED ORDER — ACETAMINOPHEN 325 MG PO TABS: 650.0000 mg | ORAL_TABLET | ORAL | Status: DC | PRN

## 2021-03-08 MED ORDER — OXYTOCIN-SODIUM CHLORIDE 30-0.9 UT/500ML-% IV SOLN
1.0000 m[IU]/min | INTRAVENOUS | Status: DC
  Administered 2021-03-08: 2 m[IU]/min via INTRAVENOUS
  Filled 2021-03-08: qty 500

## 2021-03-08 MED ORDER — OXYTOCIN BOLUS FROM INFUSION
333.0000 mL | Freq: Once | INTRAVENOUS | Status: AC
  Administered 2021-03-08: 333 mL via INTRAVENOUS

## 2021-03-08 NOTE — Progress Notes (Signed)
Theresa Winters is a 35 y.o. G3O7564 at [redacted]w[redacted]d  IOL Gestational Thrombocytopenia Pitocin infusing 12 mU GBS pos, treated Called to room for evaluation of FHR   Subjective: Comfortable with epidural  Objective: BP 129/88   Pulse (!) 102   Temp 98.1 F (36.7 C) (Oral)   Resp 18   Ht 5\' 7"  (1.702 m)   Wt 84.5 kg   LMP 06/06/2020   BMI 29.16 kg/m  No intake/output data recorded. Total I/O In: -  Out: 175 [Urine:175]  FHT:  140s, mod variability, accels absent, repetitive variable decelerations  UC:   regular, every 2 minutes SVE:  4-5/90/-2 IUPC placed. Begin amnioinfusion AROM complete clear fluid 1818   Labs: Lab Results  Component Value Date   WBC 7.3 03/08/2021   HGB 11.2 (L) 03/08/2021   HCT 35.1 (L) 03/08/2021   MCV 90.5 03/08/2021   PLT 165 03/08/2021    Assessment / Plan:    Labor: AROM complete. Pitocin per protocol. IUPC placed. Begin amnioinfusion. Encouraged maternal position change Preeclampsia:  no signs or symptoms of toxicity Fetal Wellbeing:  Category II Pain Control:  Epidural in place I/D:   GBS pos treated Anticipated MOD:  NSVD  Theresa Winters B Theresa Winters 03/08/2021, 8:55 PM

## 2021-03-08 NOTE — Anesthesia Preprocedure Evaluation (Signed)
Anesthesia Evaluation  Patient identified by MRN, date of birth, ID band Patient awake    Reviewed: Allergy & Precautions, NPO status , Patient's Chart, lab work & pertinent test results  Airway Mallampati: II  TM Distance: >3 FB Neck ROM: Full    Dental  (+) Teeth Intact, Dental Advisory Given, Missing   Pulmonary neg pulmonary ROS,    Pulmonary exam normal breath sounds clear to auscultation       Cardiovascular negative cardio ROS Normal cardiovascular exam Rhythm:Regular Rate:Normal     Neuro/Psych PSYCHIATRIC DISORDERS Anxiety negative neurological ROS     GI/Hepatic negative GI ROS, Neg liver ROS,   Endo/Other  negative endocrine ROS  Renal/GU negative Renal ROS     Musculoskeletal negative musculoskeletal ROS (+) Scoliosis    Abdominal   Peds  Hematology  (+) Blood dyscrasia, anemia , Plt 165k    Anesthesia Other Findings Day of surgery medications reviewed with the patient.  Reproductive/Obstetrics                             Anesthesia Physical Anesthesia Plan  ASA: 2  Anesthesia Plan: Epidural   Post-op Pain Management:    Induction:   PONV Risk Score and Plan: 2 and Treatment may vary due to age or medical condition  Airway Management Planned: Natural Airway  Additional Equipment:   Intra-op Plan:   Post-operative Plan:   Informed Consent: I have reviewed the patients History and Physical, chart, labs and discussed the procedure including the risks, benefits and alternatives for the proposed anesthesia with the patient or authorized representative who has indicated his/her understanding and acceptance.     Dental advisory given  Plan Discussed with:   Anesthesia Plan Comments: (Patient identified. Risks/Benefits/Options discussed with patient including but not limited to bleeding, infection, nerve damage, paralysis, failed block, incomplete pain control,  headache, blood pressure changes, nausea, vomiting, reactions to medication both or allergic, itching and postpartum back pain. Confirmed with bedside nurse the patient's most recent platelet count. Confirmed with patient that they are not currently taking any anticoagulation, have any bleeding history or any family history of bleeding disorders. Patient expressed understanding and wished to proceed. All questions were answered. )        Anesthesia Quick Evaluation

## 2021-03-08 NOTE — Progress Notes (Signed)
Theresa Winters is a 35 y.o. M2T1173 at [redacted]w[redacted]d  IOL Gestational Thrombocytopenia Pitocin infusing 10 mU GBS pos, treated  Subjective: Feeling cramping  Objective: BP (!) 117/58   Pulse 72   Temp 98 F (36.7 C) (Oral)   Resp 18   Ht 5\' 7"  (1.702 m)   Wt 84.5 kg   LMP 06/06/2020   BMI 29.16 kg/m  No intake/output data recorded. No intake/output data recorded.  FHT:  FHR: 140 bpm, variability: moderate,  accelerations:  Present,  decelerations:  Absent UC:   regular, every 2-3 minutes SVE:   Dilation: 1.5 Effacement (%): 70 Station: -3 Exam by:: A Showfety RN  Labs: Lab Results  Component Value Date   WBC 7.3 03/08/2021   HGB 11.2 (L) 03/08/2021   HCT 35.1 (L) 03/08/2021   MCV 90.5 03/08/2021   PLT 165 03/08/2021    Assessment / Plan:    Labor:  Continue pitocin per protocol, consider dinner and pit break if no active labor or significant response by shift change Preeclampsia:  no signs or symptoms of toxicity Fetal Wellbeing:  Category I Pain Control:  Labor support without medications I/D:   GBS pos treated Anticipated MOD:  NSVD  Zayven Powe B Marlow Berenguer 03/08/2021, 3:44 PM

## 2021-03-08 NOTE — Progress Notes (Signed)
Theresa Winters is a 35 y.o. Q6V7846 at [redacted]w[redacted]d  IOL Gestational Thrombocytopenia Pitocin infusing 16 mU GBS pos, treated Called by RN for FHR evaluation  Subjective: Feeling more pain, desires epidural  Objective: BP 131/83   Pulse 85   Temp 98 F (36.7 C) (Oral)   Resp 18   Ht 5\' 7"  (1.702 m)   Wt 84.5 kg   LMP 06/06/2020   BMI 29.16 kg/m  No intake/output data recorded. No intake/output data recorded.  FHT:  140s, mod variability, accels absent, repetitive variables present UC:   regular, every 2 minutes SVE:  deferred   Labs: Lab Results  Component Value Date   WBC 7.3 03/08/2021   HGB 11.2 (L) 03/08/2021   HCT 35.1 (L) 03/08/2021   MCV 90.5 03/08/2021   PLT 165 03/08/2021    Assessment / Plan:    Labor: Decrease pitocin provides FHR recovery Preeclampsia:  no signs or symptoms of toxicity Fetal Wellbeing:  Category II Pain Control:  Epidural as desires I/D:   GBS pos treated Anticipated MOD:  NSVD  Latesa Fratto B Tomasita Beevers 03/08/2021, 5:28 PM

## 2021-03-08 NOTE — Anesthesia Procedure Notes (Addendum)
Epidural Patient location during procedure: OB Start time: 03/08/2021 5:27 PM End time: 03/08/2021 5:34 PM  Staffing Anesthesiologist: Cecile Hearing, MD Performed: anesthesiologist   Preanesthetic Checklist Completed: patient identified, IV checked, risks and benefits discussed, monitors and equipment checked, pre-op evaluation and timeout performed  Epidural Patient position: sitting Prep: DuraPrep Patient monitoring: blood pressure and continuous pulse ox Approach: midline Location: L3-L4 Injection technique: LOR air  Needle:  Needle type: Tuohy  Needle gauge: 17 G Needle length: 9 cm Needle insertion depth: 6 cm Catheter size: 19 Gauge Catheter at skin depth: 11 cm Test dose: negative and Other (1% Lidocaine)  Additional Notes Patient identified.  Risk benefits discussed including failed block, incomplete pain control, headache, nerve damage, paralysis, blood pressure changes, nausea, vomiting, reactions to medication both toxic or allergic, and postpartum back pain.  Patient expressed understanding and wished to proceed.  All questions were answered.  Sterile technique used throughout procedure and epidural site dressed with sterile barrier dressing. No paresthesia or other complications noted. The patient did not experience any signs of intravascular injection such as tinnitus or metallic taste in mouth nor signs of intrathecal spread such as rapid motor block. Please see nursing notes for vital signs. Reason for block:procedure for pain

## 2021-03-08 NOTE — Progress Notes (Signed)
Theresa Winters is a 35 y.o. [id number] at [redacted]w[redacted]d  IOL Gestational Thrombocytopenia Pitocin infusing 12 mU GBS pos, treated   Subjective: Comfortable with epidural  Objective: BP 119/80   Pulse 92   Temp 98.1 F (36.7 C)   Resp 18   Ht 5\' 7"  (1.702 m)   Wt 84.5 kg   LMP 06/06/2020   BMI 29.16 kg/m  No intake/output data recorded. No intake/output data recorded.  FHT:  140s, mod variability, accels present, decelerations absent UC:   regular, every 2 minutes SVE:  3/80/-2 AROM complete clear fluid 1818   Labs: Lab Results  Component Value Date   WBC 7.3 03/08/2021   HGB 11.2 (L) 03/08/2021   HCT 35.1 (L) 03/08/2021   MCV 90.5 03/08/2021   PLT 165 03/08/2021    Assessment / Plan:    Labor: AROM complete. Pitocin per protocl Preeclampsia:  no signs or symptoms of toxicity Fetal Wellbeing:  Category I Pain Control:  Epidural in place I/D:   GBS pos treated Anticipated MOD:  NSVD  Theresa Winters Theresa Winters 03/08/2021, 7:05 PM

## 2021-03-08 NOTE — H&P (Signed)
Theresa Winters is a 35 y.o. female presenting for elective IOL Denies contractions, vb, or lof. Reports adequate fetal movement. GTCP Scoliosis GBS pos OB History     Gravida  5   Para  2   Term  2   Preterm  0   AB  1   Living  2      SAB  0   IAB  1   Ectopic  0   Multiple  0   Live Births  2          Past Medical History:  Diagnosis Date   Anemia    with pregnancy   Dyspnea    History of pre-eclampsia    Medical history non-contributory    Scoliosis    Scoliosis    Vaginal Pap smear, abnormal    Past Surgical History:  Procedure Laterality Date   LEEP     THERAPEUTIC ABORTION     Family History: family history includes Cancer in her mother; Depression in her father. Social History:  reports that she has never smoked. She has never used smokeless tobacco. She reports previous alcohol use. She reports that she does not use drugs.     Maternal Diabetes: No Genetic Screening: Normal Maternal Ultrasounds/Referrals: Normal Fetal Ultrasounds or other Referrals:  None Maternal Substance Abuse:  No Significant Maternal Medications:  None Significant Maternal Lab Results:  Group B Strep positive Other Comments:  None  Review of Systems  Constitutional: Negative.   HENT: Negative.    Eyes: Negative.   Respiratory: Negative.    Cardiovascular: Negative.   Gastrointestinal: Negative.   Endocrine: Negative.   Genitourinary: Negative.   Musculoskeletal: Negative.   Skin: Negative.   Allergic/Immunologic: Negative.   Neurological: Negative.   Hematological: Negative.   Psychiatric/Behavioral: Negative.    History   Blood pressure 122/87, pulse 94, temperature 98.1 F (36.7 C), temperature source Oral, resp. rate 18, height 5\' 7"  (1.702 m), weight 84.5 kg, last menstrual period 06/06/2020, unknown if currently breastfeeding. Exam Physical Exam Constitutional:      Appearance: Normal appearance. She is normal weight.  HENT:     Head:  Normocephalic and atraumatic.     Nose: Nose normal.     Mouth/Throat:     Mouth: Mucous membranes are moist.     Pharynx: Oropharynx is clear.  Eyes:     Extraocular Movements: Extraocular movements intact.     Conjunctiva/sclera: Conjunctivae normal.     Pupils: Pupils are equal, round, and reactive to light.  Cardiovascular:     Rate and Rhythm: Normal rate and regular rhythm.     Pulses: Normal pulses.     Heart sounds: Normal heart sounds.  Pulmonary:     Effort: Pulmonary effort is normal.     Breath sounds: Normal breath sounds.  Abdominal:     Palpations: Abdomen is soft.  Musculoskeletal:        General: Normal range of motion.     Cervical back: Normal range of motion and neck supple.  Skin:    General: Skin is warm and dry.  Neurological:     General: No focal deficit present.     Mental Status: She is alert and oriented to person, place, and time. Mental status is at baseline.  Psychiatric:        Mood and Affect: Mood normal.        Behavior: Behavior normal.        Thought Content: Thought content normal.  Judgment: Judgment normal.    1/50/-3 per RN Cat I FHR tracing FHR 140s, Accels absent, Decels absent, mod variability Bedside US confirms vertex  Prenatal labs: ABO, Rh:  B pos Antibody:  neg Rubella: Immune (12/14 0000) RPR:   nr HBsAg: Negative (12/14 0000)  HIV: Non-reactive (04/25 0000)  GBS: Positive/-- (06/23 0000)   Assessment/Plan: Admit to LDR Routine LDR orders PT may have light meal GBS pos, begin prophylaxis Pitocin per order set Epidural if desires Anticipate SVD   Victorino Dike B Darrell Hauk 03/08/2021, 8:36 AM

## 2021-03-09 LAB — CBC
HCT: 32.7 % — ABNORMAL LOW (ref 36.0–46.0)
Hemoglobin: 10.9 g/dL — ABNORMAL LOW (ref 12.0–15.0)
MCH: 28.8 pg (ref 26.0–34.0)
MCHC: 33.3 g/dL (ref 30.0–36.0)
MCV: 86.5 fL (ref 80.0–100.0)
Platelets: 145 10*3/uL — ABNORMAL LOW (ref 150–400)
RBC: 3.78 MIL/uL — ABNORMAL LOW (ref 3.87–5.11)
RDW: 14.4 % (ref 11.5–15.5)
WBC: 10.3 10*3/uL (ref 4.0–10.5)
nRBC: 0 % (ref 0.0–0.2)

## 2021-03-09 MED ORDER — COCONUT OIL OIL: 1.0000 "application " | TOPICAL_OIL | Status: DC | PRN

## 2021-03-09 MED ORDER — MEDROXYPROGESTERONE ACETATE 150 MG/ML IM SUSP: 150.0000 mg | INTRAMUSCULAR | Status: DC | PRN

## 2021-03-09 MED ORDER — SENNOSIDES-DOCUSATE SODIUM 8.6-50 MG PO TABS
2.0000 | ORAL_TABLET | Freq: Every day | ORAL | Status: DC
  Administered 2021-03-10: 2 via ORAL
  Filled 2021-03-09: qty 2

## 2021-03-09 MED ORDER — BENZOCAINE-MENTHOL 20-0.5 % EX AERO: 1.0000 "application " | INHALATION_SPRAY | CUTANEOUS | Status: DC | PRN

## 2021-03-09 MED ORDER — WITCH HAZEL-GLYCERIN EX PADS: 1.0000 "application " | MEDICATED_PAD | CUTANEOUS | Status: DC | PRN

## 2021-03-09 MED ORDER — TETANUS-DIPHTH-ACELL PERTUSSIS 5-2.5-18.5 LF-MCG/0.5 IM SUSY: 0.5000 mL | PREFILLED_SYRINGE | Freq: Once | INTRAMUSCULAR | Status: DC

## 2021-03-09 MED ORDER — IBUPROFEN 600 MG PO TABS
600.0000 mg | ORAL_TABLET | Freq: Four times a day (QID) | ORAL | Status: DC
  Administered 2021-03-09 – 2021-03-10 (×5): 600 mg via ORAL
  Filled 2021-03-09 (×5): qty 1

## 2021-03-09 MED ORDER — DIPHENHYDRAMINE HCL 25 MG PO CAPS: 25.0000 mg | ORAL_CAPSULE | Freq: Four times a day (QID) | ORAL | Status: DC | PRN

## 2021-03-09 MED ORDER — DIBUCAINE (PERIANAL) 1 % EX OINT: 1.0000 "application " | TOPICAL_OINTMENT | CUTANEOUS | Status: DC | PRN

## 2021-03-09 MED ORDER — ACETAMINOPHEN 325 MG PO TABS
650.0000 mg | ORAL_TABLET | ORAL | Status: DC | PRN
  Administered 2021-03-09 – 2021-03-10 (×2): 650 mg via ORAL
  Filled 2021-03-09 (×2): qty 2

## 2021-03-09 MED ORDER — SALINE SPRAY 0.65 % NA SOLN
1.0000 | NASAL | Status: DC | PRN
  Administered 2021-03-09: 1 via NASAL
  Filled 2021-03-09: qty 44

## 2021-03-09 MED ORDER — ZOLPIDEM TARTRATE 5 MG PO TABS: 5.0000 mg | ORAL_TABLET | Freq: Every evening | ORAL | Status: DC | PRN

## 2021-03-09 MED ORDER — ONDANSETRON HCL 4 MG PO TABS: 4.0000 mg | ORAL_TABLET | ORAL | Status: DC | PRN

## 2021-03-09 MED ORDER — PRENATAL MULTIVITAMIN CH
1.0000 | ORAL_TABLET | Freq: Every day | ORAL | Status: DC
  Administered 2021-03-09: 1 via ORAL
  Filled 2021-03-09: qty 1

## 2021-03-09 MED ORDER — ONDANSETRON HCL 4 MG/2ML IJ SOLN
4.0000 mg | INTRAMUSCULAR | Status: DC | PRN
  Administered 2021-03-09: 4 mg via INTRAVENOUS
  Filled 2021-03-09: qty 2

## 2021-03-09 MED ORDER — OXYCODONE-ACETAMINOPHEN 5-325 MG PO TABS: 1.0000 | ORAL_TABLET | ORAL | Status: DC | PRN

## 2021-03-09 MED ORDER — SIMETHICONE 80 MG PO CHEW
80.0000 mg | CHEWABLE_TABLET | ORAL | Status: DC | PRN
  Administered 2021-03-10: 80 mg via ORAL

## 2021-03-09 NOTE — Progress Notes (Signed)
Pt called out and said that she felt heart palpitations, felt nauseous, and had heart burn. She stated that this happened to her with her other baby and that she was dehydrated. She believes this may be the case as well. I brought her more fluids and told them to drink them slow. I gave her Zofran IV, her vital signs were stable, and told her to call me if she felt worse or if the Zofran did not help. I will continue to monitor patient and alert MD if warranted.

## 2021-03-09 NOTE — Anesthesia Postprocedure Evaluation (Signed)
Anesthesia Post Note  Patient: Theresa Winters  Procedure(s) Performed: AN AD HOC LABOR EPIDURAL     Patient location during evaluation: Mother Baby Anesthesia Type: Epidural Level of consciousness: awake and alert Pain management: pain level controlled Vital Signs Assessment: post-procedure vital signs reviewed and stable Respiratory status: spontaneous breathing, nonlabored ventilation and respiratory function stable Cardiovascular status: stable Postop Assessment: no headache, no backache, epidural receding, no apparent nausea or vomiting, patient able to bend at knees, adequate PO intake and able to ambulate Anesthetic complications: no   No notable events documented.  Last Vitals:  Vitals:   03/09/21 0319 03/09/21 0535  BP:  118/75  Pulse: 90 87  Resp:  18  Temp:  36.9 C  SpO2: 100%     Last Pain:  Vitals:   03/09/21 0535  TempSrc: Oral  PainSc:    Pain Goal:                   Laban Emperor

## 2021-03-09 NOTE — Progress Notes (Signed)
PPD# 1 SVD w/ intact perineum Information for the patient's newborn:  Theresa, Winters [[id number]]  female   Baby Name [name] Circumcision desires in-pt circ   S:   Reports feeling good. Informed pt that interval BTL will be performed by [facility] provider.  Tolerating PO fluid and solids No nausea or vomiting Bleeding is light Pain controlled with  PO meds Up ad lib / ambulatory / voiding w/o difficulty  O:   VS: BP 118/70 (BP Location: Right Arm)   Pulse 84   Temp 98.2 F (36.8 C) (Oral)   Resp 18   Ht 5\' 7"  (1.702 m)   Wt 84.5 kg   LMP 06/06/2020   SpO2 100%   Breastfeeding Unknown   BMI 29.16 kg/m   LABS:  Recent Labs    03/08/21 0809 03/09/21 0427  WBC 7.3 10.3  HGB 11.2* 10.9*  PLT 165 145*   Blood type: --/--/B POS (07/16 0815) Rubella: Immune (12/14 0000)                      I&O: Intake/Output      07/16 0701 07/17 0700 07/17 0701 07/18 0700   Urine (mL/kg/hr) 335    Blood 350    Total Output 685    Net -685         Urine Occurrence  1 x     Physical Exam: Alert and oriented X3 Lungs: Clear and unlabored Heart: regular rate and rhythm / no mumurs Abdomen: soft, non-tender, non-distended  Fundus: firm, non-tender, U-2 Perineum: intact Lochia: appropriate Extremities: trace edema, no calf pain or tenderness    A:  PPD # 1  Normal exam Thrombocytopenia    -platelets 165 on admission and 145 PP   P:  Routine post partum orders Anticipate D/C on 03/10/21 Plan reviewed w/ Dr. 03/12/21, MSN, CNM 03/09/2021, 12:41 PM

## 2021-03-10 DIAGNOSIS — Z3490 Encounter for supervision of normal pregnancy, unspecified, unspecified trimester: Secondary | ICD-10-CM | POA: Diagnosis present

## 2021-03-10 MED ORDER — IBUPROFEN 600 MG PO TABS: 600.0000 mg | ORAL_TABLET | Freq: Four times a day (QID) | ORAL | 0 refills | Status: DC

## 2021-03-10 NOTE — Discharge Summary (Signed)
SVD OB Discharge Summary     Patient Name: Theresa Winters DOB: [birth year] MRN: [id number]  Date of admission: 03/08/2021 Delivering MD: Verdis Prime B  Date of delivery: 03/08/2021 Type of delivery: SVD  Newborn Data: Sex: Baby female Circumcision: To be completed today Live born female  Birth Weight: 7 lb 8.5 oz (3416 g) APGAR: 9, 9  Newborn Delivery   Birth date/time: 03/08/2021 22:15:00 Delivery type: Vaginal, Spontaneous      Feeding: breast and bottle Infant being discharge to home with mother in stable condition.   Admitting diagnosis: Thrombocytopenia (HCC) [D69.6] Intrauterine pregnancy: [redacted]w[redacted]d     Secondary diagnosis:  Principal Problem:   SVD (7/16) Active Problems:   Thrombocytopenia (HCC)   Normal postpartum course   Encounter for induction of labor                                Complications: None                                                              Intrapartum Procedures: spontaneous vaginal delivery and GBS prophylaxis Postpartum Procedures: none Complications-Operative and Postpartum: none Augmentation: AROM and Pitocin   History of Present Illness: [name] is a 35 y.o. female, [id number], who presents at [redacted]w[redacted]d weeks gestation. The patient has been followed at  [facility]  Her pregnancy has been complicated by:  Patient Active Problem List   Diagnosis Date Noted   Normal postpartum course 03/10/2021   Encounter for induction of labor 03/10/2021   Thrombocytopenia (HCC) 03/08/2021   SVD (7/16) 03/08/2021   Scoliosis deformity of spine 08/22/2018   Anemia of pregnancy 06/28/2018   Panic attacks 01/06/2018     Active Ambulatory Problems    Diagnosis Date Noted   Anemia of pregnancy 06/28/2018   Panic attacks 01/06/2018   Scoliosis deformity of spine 08/22/2018   Resolved Ambulatory Problems    Diagnosis Date Noted   Amniotic fluid leaking 10/07/2018   Normal labor 10/07/2018    Palpitations 01/06/2018   Pregnant 02/28/2018   Vaginal bleeding in pregnancy, third trimester 10/04/2018   Vaginal delivery 10/09/2018   Hypertension in pregnancy, pre-eclampsia, severe, delivered/postpartum 10/13/2018   Preeclampsia, severe 10/14/2018   Past Medical History:  Diagnosis Date   Anemia    Dyspnea    History of pre-eclampsia    Medical history non-contributory    Scoliosis    Scoliosis    Vaginal Pap smear, abnormal      Hospital course:  Induction of Labor With Vaginal Delivery   35 y.o. yo [id number] at [redacted]w[redacted]d was admitted to the hospital 03/08/2021 for induction of labor.  Indication for induction:  Gestational thrombocytopenia .  Patient had an uncomplicated labor course as follows: Membrane Rupture Time/Date: 6:18 PM ,03/08/2021   Delivery Method:Vaginal, Spontaneous  Episiotomy: None  Lacerations:  None  Details of delivery can be found in separate delivery note.  Patient had a routine postpartum course. Patient is discharged home 03/10/21.  Newborn Data: Birth date:03/08/2021  Birth time:10:15 PM  Gender:Female  Living status:Living  Apgars:9 ,9  Weight:3416 g  Postpartum Day # 2 : S/P NSVD due to admitted on 7/16 for gestational thrombocytopenia on  5/24 platelets were 131, admitted 165, and PP 145, denies bleeding issues, stable, progressed with pitocin and AROM to SVD on 7/16 over intact perineum ebl hgb drop of 11.2-10.9, asymptomatic, stable. Pt had severe hear palpitation PP which resolved with fluid, stable. Patient up ad lib, denies syncope or dizziness. Reports consuming regular diet without issues and denies N/V. Patient reports 0 bowel movement + passing flatus.  Denies issues with urination and reports bleeding is "lighter."  Patient is breast and bottlefeeding and reports going well.  Desires PP BTL at 6 weeks PP for postpartum contraception.  Pain is being appropriately managed with use of po meds.   Physical exam  Vitals:   03/09/21 1246 03/09/21  2023 03/10/21 0554 03/10/21 0931  BP: 116/69 126/83 111/77 126/70  Pulse: 62 88 60 69  Resp: 18 18 18 18   Temp: 97.9 F (36.6 C) 98.1 F (36.7 C) 98.4 F (36.9 C) 97.9 F (36.6 C)  TempSrc: Oral Oral Oral Oral  SpO2:   100%   Weight:      Height:       General: alert, cooperative, and no distress Lochia: appropriate Uterine Fundus: firm Perineum: Intact DVT Evaluation: No evidence of DVT seen on physical exam. Negative Homan's sign. No cords or calf tenderness.  Labs: Lab Results  Component Value Date   WBC 10.3 03/09/2021   HGB 10.9 (L) 03/09/2021   HCT 32.7 (L) 03/09/2021   MCV 86.5 03/09/2021   PLT 145 (L) 03/09/2021   CMP Latest Ref Rng & Units 01/14/2021  Glucose 70 - 99 mg/dL 79  BUN 6 - 20 mg/dL 11  Creatinine 01/16/2021 - 3.38 mg/dL 2.50  Sodium 5.39 - 767 mmol/L 134(L)  Potassium 3.5 - 5.1 mmol/L 3.5  Chloride 98 - 111 mmol/L 107  CO2 22 - 32 mmol/L 20(L)  Calcium 8.9 - 10.3 mg/dL 8.3(L)  Total Protein 6.5 - 8.1 g/dL -  Total Bilirubin 0.3 - 1.2 mg/dL -  Alkaline Phos 38 - 341 U/L -  AST 15 - 41 U/L -  ALT 0 - 44 U/L -    Date of discharge: 03/10/2021 Discharge Diagnoses: Term Pregnancy-delivered Discharge instruction: per After Visit Summary and "Baby and Me Booklet".  After visit meds:   Activity:           unrestricted and pelvic rest Advance as tolerated. Pelvic rest for 6 weeks.  Diet:                routine Medications: PNV and Ibuprofen Postpartum contraception: Tubal Ligation and at 6 weeks PP Condition:  Pt discharge to home with baby in stable and condition  Meds: Allergies as of 03/10/2021   No Known Allergies      Medication List     STOP taking these medications    ondansetron 4 MG disintegrating tablet Commonly known as: Zofran ODT   terconazole 0.4 % vaginal cream Commonly known as: TERAZOL 7       TAKE these medications    docusate sodium 100 MG capsule Commonly known as: COLACE Take 1 capsule (100 mg total) by mouth 2  (two) times daily as needed.   ferrous sulfate 325 (65 FE) MG tablet Take 325 mg by mouth daily with breakfast.   ibuprofen 600 MG tablet Commonly known as: ADVIL Take 1 tablet (600 mg total) by mouth every 6 (six) hours.   polyethylene glycol 17 g packet Commonly known as: MiraLax Take 17 g by mouth daily.  Discharge Follow Up:   Follow-up Information     [facility]. Schedule an appointment as soon as possible for a visit in 6 week(s).   Specialty: Obstetrics and Gynecology Contact information: [address] [place] [address] [phone]                 [place], NP-C, CNM 03/10/2021, 10:17 AM  Dale Evangeline, FNP

## 2021-03-11 LAB — SURGICAL PATHOLOGY

## 2021-03-20 ENCOUNTER — Telehealth (HOSPITAL_COMMUNITY): Payer: Self-pay

## 2021-03-20 NOTE — Telephone Encounter (Signed)
  No answer. Left message to return nurse call.  Marcelino [facility] 03/20/2021,1810

## 2021-06-02 ENCOUNTER — Other Ambulatory Visit: Payer: Self-pay

## 2021-06-02 ENCOUNTER — Emergency Department (HOSPITAL_BASED_OUTPATIENT_CLINIC_OR_DEPARTMENT_OTHER)
Admission: EM | Admit: 2021-06-02 | Discharge: 2021-06-02 | Disposition: A | Discharge status: Home / Self Care | Payer: BLUE CROSS/BLUE SHIELD | Attending: Emergency Medicine | Admitting: Emergency Medicine

## 2021-06-02 ENCOUNTER — Encounter (HOSPITAL_BASED_OUTPATIENT_CLINIC_OR_DEPARTMENT_OTHER): Payer: Self-pay

## 2021-06-02 DIAGNOSIS — R197 Diarrhea, unspecified: Secondary | ICD-10-CM | POA: Diagnosis not present

## 2021-06-02 DIAGNOSIS — L509 Urticaria, unspecified: Secondary | ICD-10-CM | POA: Diagnosis not present

## 2021-06-02 DIAGNOSIS — T7840XA Allergy, unspecified, initial encounter: Secondary | ICD-10-CM | POA: Insufficient documentation

## 2021-06-02 MED ORDER — PREDNISONE 10 MG PO TABS: 40.0000 mg | ORAL_TABLET | Freq: Every day | ORAL | 0 refills | Status: DC

## 2021-06-02 MED ORDER — EPINEPHRINE 0.3 MG/0.3ML IJ SOAJ: 0.3000 mg | INTRAMUSCULAR | 0 refills | Status: DC | PRN

## 2021-06-02 MED ORDER — FAMOTIDINE IN NACL 20-0.9 MG/50ML-% IV SOLN
20.0000 mg | Freq: Once | INTRAVENOUS | Status: AC
  Administered 2021-06-02: 20 mg via INTRAVENOUS
  Filled 2021-06-02: qty 50

## 2021-06-02 MED ORDER — METHYLPREDNISOLONE SODIUM SUCC 125 MG IJ SOLR
125.0000 mg | Freq: Every day | INTRAMUSCULAR | Status: DC
  Administered 2021-06-02: 125 mg via INTRAVENOUS
  Filled 2021-06-02: qty 2

## 2021-06-02 MED ORDER — DIPHENHYDRAMINE HCL 50 MG/ML IJ SOLN
25.0000 mg | Freq: Once | INTRAMUSCULAR | Status: AC
  Administered 2021-06-02: 25 mg via INTRAVENOUS
  Filled 2021-06-02: qty 1

## 2021-06-02 NOTE — ED Notes (Signed)
Seen before triage, SpO2 100%, HR 107, no stridor, no dyspnea noted.  BBS CTA.

## 2021-06-02 NOTE — ED Provider Notes (Signed)
[facility] EMERGENCY DEPARTMENT Provider Note   CSN: [id number] Arrival date & time: 06/02/21  1623     History Chief Complaint  Patient presents with   Allergic Reaction    Theresa Winters is a 35 y.o. female with a past medical history of preeclampsia presenting today with a complaint of allergic reaction after eating [place] food.  Patient reports that this afternoon she tried a new [place], and after she ate she began to have diarrhea and notice a diffuse rash.  She became concerned when her throat felt dry and scratchy.  No history of food allergy.  Reports that the rash started on her dorsal hands and spread up her arms.  Denies difficulty breathing or chest pain.  No dizziness or feelings of syncope.    Allergic Reaction Presenting symptoms: rash       Past Medical History:  Diagnosis Date   Anemia    with pregnancy   Dyspnea    History of pre-eclampsia    Medical history non-contributory    Scoliosis    Scoliosis    Vaginal Pap smear, abnormal     Patient Active Problem List   Diagnosis Date Noted   Normal postpartum course 03/10/2021   Encounter for induction of labor 03/10/2021   Thrombocytopenia (HCC) 03/08/2021   SVD (7/16) 03/08/2021   Scoliosis deformity of spine 08/22/2018   Anemia of pregnancy 06/28/2018   Panic attacks 01/06/2018    Past Surgical History:  Procedure Laterality Date   LEEP     THERAPEUTIC ABORTION       OB History     Gravida  5   Para  3   Term  3   Preterm  0   AB  1   Living  3      SAB  0   IAB  1   Ectopic  0   Multiple  0   Live Births  3           Family History  Problem Relation Age of Onset   Cancer Mother        Breast Cancer    Depression Father     Social History   Tobacco Use   Smoking status: Never   Smokeless tobacco: Never  Vaping Use   Vaping Use: Never used  Substance Use Topics   Alcohol use: Not Currently   Drug use: No    Home  Medications Prior to Admission medications   Medication Sig Start Date End Date Taking? Authorizing Provider  ibuprofen (ADVIL) 600 MG tablet Take 1 tablet (600 mg total) by mouth every 6 (six) hours. 03/10/21   Dale Jay, FNP  pantoprazole (PROTONIX) 40 MG tablet Take 40 mg by mouth daily. 01/02/21   [provider]    Allergies    Patient has no known allergies.  Review of Systems   Review of Systems  HENT:  Positive for sore throat.   Respiratory:  Negative for shortness of breath.   Cardiovascular:  Negative for chest pain and palpitations.  Gastrointestinal:  Positive for diarrhea. Negative for constipation, nausea and vomiting.  Skin:  Positive for rash.  Neurological:  Negative for dizziness and syncope.  All other systems reviewed and are negative.  Physical Exam Updated Vital Signs BP (!) 179/105   Pulse 67   Temp 98.3 F (36.8 C) (Oral)   Resp 15   Ht 5\' 7"  (1.702 m)   Wt 68.5 kg   SpO2 100%  Breastfeeding Yes   BMI 23.65 kg/m   Physical Exam Vitals and nursing note reviewed.  Constitutional:      General: She is not in acute distress.    Appearance: Normal appearance. She is not diaphoretic.  HENT:     Head: Normocephalic and atraumatic.     Nose: Nose normal.     Mouth/Throat:     Mouth: Mucous membranes are moist.     Pharynx: Oropharynx is clear.     Comments: No signs of swelling or angioedema Eyes:     General: No scleral icterus.    Conjunctiva/sclera: Conjunctivae normal.  Cardiovascular:     Rate and Rhythm: Normal rate and regular rhythm.  Pulmonary:     Effort: Pulmonary effort is normal. No respiratory distress.     Breath sounds: Normal breath sounds. No wheezing or rales.  Abdominal:     General: Abdomen is flat.     Tenderness: There is no abdominal tenderness.  Skin:    General: Skin is warm and dry.     Findings: Rash (Multiple areas of urticaria on dorsal hands, bilateral arms and anterior chest) present.   Neurological:     Mental Status: She is alert.  Psychiatric:        Mood and Affect: Mood normal.        Behavior: Behavior normal.    ED Results / Procedures / Treatments   Labs (all labs ordered are listed, but only abnormal results are displayed) Labs Reviewed - No data to display  EKG None  Radiology No results found.  Procedures Procedures   Medications Ordered in ED Medications  methylPREDNISolone sodium succinate (SOLU-MEDROL) 125 mg/2 mL injection 125 mg (125 mg Intravenous Given 06/02/21 1758)  famotidine (PEPCID) IVPB 20 mg premix (20 mg Intravenous New Bag/Given 06/02/21 1804)  diphenhydrAMINE (BENADRYL) injection 25 mg (25 mg Intravenous Given 06/02/21 1756)    ED Course  I have reviewed the triage vital signs and the nursing notes.  Pertinent labs & imaging results that were available during my care of the patient were reviewed by me and considered in my medical decision making (see chart for details).    MDM Rules/Calculators/A&P  Patient was evaluated by me at bedside.  She had no signs of angioedema or brought scale anaphylaxis.  Her breath sounds were clear and heart rate normal. No increase in work of breathing. There were multiple areas of urticaria that the patient reported to be spreading.  She also had GI symptoms such as diarrhea.  She was treated with Solu-Medrol, Pepcid and Benadryl.  Upon reevaluation patient was sleeping and feeling much better.  Many areas of urticaria resolved.  We discussed that she may utilize over-the-counter antihistamines for continued treatment.  I am sending her home with 5 days of prednisone.  We discussed that she may breast-feed on this medication due to its short course.  She may hold breast-feeding for 4 hours if desired.  No evidence of necessity.  Patient ambulatory and stable for discharge.  She will also be given a written prescription for an EpiPen for her to fill at an affordable pharmacy.  Patient agreeable to this  plan  Final Clinical Impression(s) / ED Diagnoses Final diagnoses:  Allergic reaction, initial encounter    Rx / DC Orders Results and diagnoses were explained to the patient. Return precautions discussed in full and attached to discharge papers. Patient had no additional questions and expressed complete understanding.     Evanna Washinton A, PA-C  06/02/21 1903    Cheryll Cockayne, MD 06/11/21 [phone]

## 2021-06-02 NOTE — Discharge Instructions (Addendum)
You are stable for discharge at this time.  Please read the information on allergic reactions and how to use an EpiPen attached to these discharge papers.  You may utilize over-the-counter medications such as Zyrtec, Claritin or Allegra to treat continued symptoms.  These will keep you from becoming drowsy.  Benadryl may also be utilized however this medication may make you sleepy.  Please return to the emergency department if you have difficulty breathing, chest pain or notice large amounts of swelling in your face.  It was a pleasure to meet you and I hope that you feel better.

## 2021-06-02 NOTE — ED Triage Notes (Signed)
Pt c/o rash/itching started ~245p about 45 min after eating-c/o throat feeling scratchy x ~15 min NAD-steady gait

## 2022-03-12 ENCOUNTER — Encounter (HOSPITAL_COMMUNITY): Payer: Self-pay | Admitting: *Deleted

## 2022-03-12 ENCOUNTER — Inpatient Hospital Stay (HOSPITAL_COMMUNITY): Payer: Medicaid Other

## 2022-03-12 ENCOUNTER — Inpatient Hospital Stay (HOSPITAL_COMMUNITY)
Admission: AD | Admit: 2022-03-12 | Discharge: 2022-03-12 | Disposition: A | Discharge status: Home / Self Care | Payer: Medicaid Other | Attending: Obstetrics and Gynecology | Admitting: Obstetrics and Gynecology

## 2022-03-12 DIAGNOSIS — O26891 Other specified pregnancy related conditions, first trimester: Secondary | ICD-10-CM | POA: Insufficient documentation

## 2022-03-12 DIAGNOSIS — O3680X0 Pregnancy with inconclusive fetal viability, not applicable or unspecified: Secondary | ICD-10-CM | POA: Diagnosis not present

## 2022-03-12 DIAGNOSIS — M549 Dorsalgia, unspecified: Secondary | ICD-10-CM | POA: Diagnosis not present

## 2022-03-12 DIAGNOSIS — M545 Low back pain, unspecified: Secondary | ICD-10-CM | POA: Diagnosis not present

## 2022-03-12 DIAGNOSIS — Z3A01 Less than 8 weeks gestation of pregnancy: Secondary | ICD-10-CM | POA: Diagnosis not present

## 2022-03-12 LAB — CBC
HCT: 40.3 % (ref 36.0–46.0)
Hemoglobin: 13.6 g/dL (ref 12.0–15.0)
MCH: 28.9 pg (ref 26.0–34.0)
MCHC: 33.7 g/dL (ref 30.0–36.0)
MCV: 85.6 fL (ref 80.0–100.0)
Platelets: 189 10*3/uL (ref 150–400)
RBC: 4.71 MIL/uL (ref 3.87–5.11)
RDW: 12.8 % (ref 11.5–15.5)
WBC: 6.8 10*3/uL (ref 4.0–10.5)
nRBC: 0 % (ref 0.0–0.2)

## 2022-03-12 LAB — URINALYSIS, ROUTINE W REFLEX MICROSCOPIC
Bilirubin Urine: NEGATIVE
Glucose, UA: NEGATIVE mg/dL
Hgb urine dipstick: NEGATIVE
Ketones, ur: NEGATIVE mg/dL
Leukocytes,Ua: NEGATIVE
Nitrite: NEGATIVE
Protein, ur: NEGATIVE mg/dL
Specific Gravity, Urine: 1.005 (ref 1.005–1.030)
pH: 7 (ref 5.0–8.0)

## 2022-03-12 LAB — COMPREHENSIVE METABOLIC PANEL
ALT: 11 U/L (ref 0–44)
AST: 13 U/L — ABNORMAL LOW (ref 15–41)
Albumin: 4.2 g/dL (ref 3.5–5.0)
Alkaline Phosphatase: 49 U/L (ref 38–126)
Anion gap: 12 (ref 5–15)
BUN: 11 mg/dL (ref 6–20)
CO2: 23 mmol/L (ref 22–32)
Calcium: 9 mg/dL (ref 8.9–10.3)
Chloride: 105 mmol/L (ref 98–111)
Creatinine, Ser: 0.79 mg/dL (ref 0.44–1.00)
GFR, Estimated: >60 mL/min (ref >60)
Glucose, Bld: 94 mg/dL (ref 70–99)
Potassium: 3.7 mmol/L (ref 3.5–5.1)
Sodium: 140 mmol/L (ref 135–145)
Total Bilirubin: 1.1 mg/dL (ref 0.3–1.2)
Total Protein: 7 g/dL (ref 6.5–8.1)

## 2022-03-12 LAB — ABO/RH: ABO/RH(D): B POS

## 2022-03-12 LAB — POCT PREGNANCY, URINE: Preg Test, Ur: POSITIVE — AB

## 2022-03-12 LAB — HCG, QUANTITATIVE, PREGNANCY: hCG, Beta Chain, Quant, S: 1991 m[IU]/mL — ABNORMAL HIGH (ref <5)

## 2022-03-12 NOTE — MAU Note (Signed)
Saw pt was in lobby, instructed reg to have pt have a seat there.  We had called her for [place],  pt not in family rm or lobby, so moved on to the next pt.

## 2022-03-12 NOTE — MAU Note (Signed)
Denelda Akerley is a 36 y.o. at Unknown here in MAU reporting: been having lower back pain on left, started 2 days ago, constant. Was worse last night, hasn't gotten any sleep.  Did not take anything for it. +HPT last THu.  Doesn't feel pregnant. Today was supposed to have abortion "but the sac was smushed".  No bleeding.  +Covid 7/9 LMP: 6/8 Onset of complaint: 2 days ago Pain score: 7..... then said mild Vitals:   03/12/22 1140  BP: 117/81  Pulse: 82  Resp: 16  Temp: 97.9 F (36.6 C)  SpO2: 100%      Lab orders placed from triage:  UPT< UA since +

## 2022-03-12 NOTE — MAU Note (Addendum)
Needing to leave, to pick up child.  Asking if her results can be called to her

## 2022-03-12 NOTE — MAU Provider Note (Signed)
History     [id number]  Arrival date and time: 03/12/22 1056    Chief Complaint  Patient presents with   Back Pain   Possible Pregnancy     HPI Theresa Winters is a 36 y.o. at [redacted]w[redacted]d by LMP with PMHx notable for anxiety, who presents for back pain and concern for miscarriage.    Patient reports she went to abortion clinic earlier today and was told on [place] her pregnancy did not look normal Because she did not want to pay $600 if she's going to miscarry anyways she came here to see what was going on Also concerned about her back pain Has had pregnancies and miscarriages and never felt like this before Does not have abdominal pain but has constant low back pain that is making it hard to even move around No vaginal bleeding No vaginal discharge No other symptoms   --/--/B POS (07/20 1315)  OB History     Gravida  6   Para  3   Term  3   Preterm  0   AB  1   Living  3      SAB  0   IAB  1   Ectopic  0   Multiple  0   Live Births  3           Past Medical History:  Diagnosis Date   Anemia    with pregnancy   Dyspnea    History of pre-eclampsia    Medical history non-contributory    Scoliosis    Scoliosis    Vaginal Pap smear, abnormal     Past Surgical History:  Procedure Laterality Date   LEEP     THERAPEUTIC ABORTION      Family History  Problem Relation Age of Onset   Cancer Mother        Breast Cancer    Depression Father     Social History   Socioeconomic History   Marital status: Single    Spouse name: Not on file   Number of children: Not on file   Years of education: Not on file   Highest education level: Not on file  Occupational History   Not on file  Tobacco Use   Smoking status: Never   Smokeless tobacco: Never  Vaping Use   Vaping Use: Never used  Substance and Sexual Activity   Alcohol use: Not Currently   Drug use: No   Sexual activity: Yes    Birth control/protection: None  Other Topics Concern   Not  on file  Social History Narrative   Not on file   Social Determinants of Health   Financial Resource Strain: Not on file  Food Insecurity: Not on file  Transportation Needs: Not on file  Physical Activity: Not on file  Stress: Not on file  Social Connections: Not on file  Intimate Partner Violence: Not on file    No Known Allergies  No current facility-administered medications on file prior to encounter.   Current Outpatient Medications on File Prior to Encounter  Medication Sig Dispense Refill   EPINEPHrine 0.3 mg/0.3 mL IJ SOAJ injection Inject 0.3 mg into the muscle as needed for anaphylaxis. 1 each 0   pantoprazole (PROTONIX) 40 MG tablet Take 40 mg by mouth daily.       ROS Pertinent positives and negative per HPI, all others reviewed and negative  Physical Exam   BP 117/81 (BP Location: Right Arm)   Pulse 82  Temp 97.9 F (36.6 C) (Oral)   Resp 16   Ht 5\' 7"  (1.702 m)   Wt 65.3 kg   LMP 01/29/2022   SpO2 100%   BMI 22.55 kg/m   Patient Vitals for the past 24 hrs:  BP Temp Temp src Pulse Resp SpO2 Height Weight  03/12/22 1140 117/81 97.9 F (36.6 C) Oral 82 16 100 % 5\' 7"  (1.702 m) 65.3 kg    Physical Exam Vitals reviewed.  Constitutional:      General: She is not in acute distress.    Appearance: She is well-developed. She is not diaphoretic.  Eyes:     General: No scleral icterus. Pulmonary:     Effort: Pulmonary effort is normal. No respiratory distress.  Abdominal:     General: There is no distension.     Palpations: Abdomen is soft.     Tenderness: There is no abdominal tenderness. There is no guarding or rebound.  Skin:    General: Skin is warm and dry.  Neurological:     Mental Status: She is alert.     Coordination: Coordination normal.      Cervical Exam    Bedside Ultrasound Pt informed that the ultrasound is considered a limited OB ultrasound and is not intended to be a complete ultrasound exam.  Patient also informed that the  ultrasound is not being completed with the intent of assessing for fetal or placental anomalies or any pelvic abnormalities.  Explained that the purpose of today's ultrasound is to assess for  viability.  Patient acknowledges the purpose of the exam and the limitations of the study.     My interpretation: no intruterine structures visualized on transabdominal exam   Labs Results for orders placed or performed during the hospital encounter of 03/12/22 (from the past 24 hour(s))  Urinalysis, Routine w reflex microscopic Urine, Clean Catch     Status: Abnormal   Collection Time: 03/12/22 11:20 AM  Result Value Ref Range   Color, Urine STRAW (A) YELLOW   APPearance CLEAR CLEAR   Specific Gravity, Urine 1.005 1.005 - 1.030   pH 7.0 5.0 - 8.0   Glucose, UA NEGATIVE NEGATIVE mg/dL   Hgb urine dipstick NEGATIVE NEGATIVE   Bilirubin Urine NEGATIVE NEGATIVE   Ketones, ur NEGATIVE NEGATIVE mg/dL   Protein, ur NEGATIVE NEGATIVE mg/dL   Nitrite NEGATIVE NEGATIVE   Leukocytes,Ua NEGATIVE NEGATIVE  Pregnancy, urine POC     Status: Abnormal   Collection Time: 03/12/22 11:25 AM  Result Value Ref Range   Preg Test, Ur POSITIVE (A) NEGATIVE  CBC     Status: None   Collection Time: 03/12/22  1:15 PM  Result Value Ref Range   WBC 6.8 4.0 - 10.5 K/uL   RBC 4.71 3.87 - 5.11 MIL/uL   Hemoglobin 13.6 12.0 - 15.0 g/dL   HCT 03/14/22 03/14/22 - 73.7 %   MCV 85.6 80.0 - 100.0 fL   MCH 28.9 26.0 - 34.0 pg   MCHC 33.7 30.0 - 36.0 g/dL   RDW 10.6 26.9 - 48.5 %   Platelets 189 150 - 400 K/uL   nRBC 0.0 0.0 - 0.2 %  Comprehensive metabolic panel     Status: Abnormal   Collection Time: 03/12/22  1:15 PM  Result Value Ref Range   Sodium 140 135 - 145 mmol/L   Potassium 3.7 3.5 - 5.1 mmol/L   Chloride 105 98 - 111 mmol/L   CO2 23 22 - 32 mmol/L   Glucose,  Bld 94 70 - 99 mg/dL   BUN 11 6 - 20 mg/dL   Creatinine, Ser 1.02 0.44 - 1.00 mg/dL   Calcium 9.0 8.9 - 72.5 mg/dL   Total Protein 7.0 6.5 - 8.1 g/dL    Albumin 4.2 3.5 - 5.0 g/dL   AST 13 (L) 15 - 41 U/L   ALT 11 0 - 44 U/L   Alkaline Phosphatase 49 38 - 126 U/L   Total Bilirubin 1.1 0.3 - 1.2 mg/dL   GFR, Estimated >36 >64 mL/min   Anion gap 12 5 - 15  hCG, quantitative, pregnancy     Status: Abnormal   Collection Time: 03/12/22  1:15 PM  Result Value Ref Range   hCG, Beta Chain, Quant, S 1,991 (H) <5 mIU/mL  ABO/Rh     Status: None   Collection Time: 03/12/22  1:15 PM  Result Value Ref Range   ABO/RH(D) B POS    No rh immune globuloin      NOT A RH IMMUNE GLOBULIN CANDIDATE, PT RH POSITIVE Performed at [facility], [address], [place], [place] [address]     Imaging US OB LESS THAN 14 WEEKS WITH OB TRANSVAGINAL  Result Date: 03/12/2022 CLINICAL DATA:  Left lower back pain for 2 days, beta HCG 1,991 EXAM: OBSTETRIC <14 WK [place] AND TRANSVAGINAL OB US TECHNIQUE: Both transabdominal and transvaginal ultrasound examinations were performed for complete evaluation of the gestation as well as the maternal uterus, adnexal regions, and pelvic cul-de-sac. Transvaginal technique was performed to assess early pregnancy. COMPARISON:  None Available. FINDINGS: Intrauterine gestational sac: Single Yolk sac:  Not Visualized. Embryo:  Not Visualized. Cardiac Activity: Not Visualized. MSD: 3.8 mm   5 w   1 d Subchorionic hemorrhage:  None visualized. Maternal uterus/adnexae: Small amount of endometrial fluid is identified, nonspecific. Endometrium measures 22 mm in total thickness. Uterus is retroverted with no focal abnormalities. Right ovary measures 3.6 x 2.8 x 2.3 cm and the left ovary measures 1.8 x 0.8 x 1.2 cm. There is a small amount of free fluid within the lower pelvis. IMPRESSION: 1. Probable early intrauterine gestational sac, but no yolk sac, fetal pole, or cardiac activity yet visualized. Recommend follow-up quantitative B-HCG levels and follow-up US in 14 days to assess viability. This recommendation follows SRU consensus guidelines:  Diagnostic Criteria for Nonviable Pregnancy Early in the First Trimester. Malva Limes Med 2013; 425:9563-87. 2. Small amount of fluid in the endometrial cavity, nonspecific. 3. Trace pelvic free fluid. Electronically Signed   By: Sharlet Salina M.D.   On: 03/12/2022 15:52    MAU Course  Procedures Lab Orders         Urinalysis, Routine w reflex microscopic Urine, Clean Catch         CBC         Comprehensive metabolic panel         hCG, quantitative, pregnancy         Pregnancy, urine POC    No orders of the defined types were placed in this encounter.  Imaging Orders         US OB LESS THAN 14 WEEKS WITH OB TRANSVAGINAL     MDM moderate  Assessment and Plan  #Pregnancy of unknown location #[redacted] weeks gestation of pregnancy Likely IUGS on [place], hcg 1991, rh positive blood type. Discussed based on abnormal appearance and back pain she is likely having a miscarriage. At this point ectopic unlikely so that is reassuring, but  can't confirm miscarriage without 48h hcg follow up. She is amenable with this plan, will return in two days for repeat hcg. Reviewed warning signs including heavy vaginal bleeding, severe abominal pain, fever.    Dispo: discharged to home in stable condition.   Venora Maples, MD/MPH 03/12/22 4:32 PM  Allergies as of 03/12/2022   No Known Allergies      Medication List     STOP taking these medications    ibuprofen 600 MG tablet Commonly known as: ADVIL   predniSONE 10 MG tablet Commonly known as: DELTASONE       TAKE these medications    EPINEPHrine 0.3 mg/0.3 mL Soaj injection Commonly known as: EPI-PEN Inject 0.3 mg into the muscle as needed for anaphylaxis.   pantoprazole 40 MG tablet Commonly known as: PROTONIX Take 40 mg by mouth daily.

## 2022-03-14 ENCOUNTER — Other Ambulatory Visit: Payer: Self-pay

## 2022-03-14 ENCOUNTER — Inpatient Hospital Stay (HOSPITAL_COMMUNITY)
Admission: AD | Admit: 2022-03-14 | Discharge: 2022-03-14 | Disposition: A | Discharge status: Home / Self Care | Payer: Medicaid Other | Attending: Obstetrics & Gynecology | Admitting: Obstetrics & Gynecology

## 2022-03-14 ENCOUNTER — Other Ambulatory Visit (HOSPITAL_COMMUNITY)
Admission: RE | Admit: 2022-03-14 | Discharge: 2022-03-14 | Disposition: A | Discharge status: Home / Self Care | Payer: Medicaid Other | Source: Ambulatory Visit | Attending: Obstetrics and Gynecology | Admitting: Obstetrics and Gynecology

## 2022-03-14 DIAGNOSIS — Z3A01 Less than 8 weeks gestation of pregnancy: Secondary | ICD-10-CM | POA: Diagnosis not present

## 2022-03-14 DIAGNOSIS — O26891 Other specified pregnancy related conditions, first trimester: Secondary | ICD-10-CM | POA: Diagnosis present

## 2022-03-14 DIAGNOSIS — E349 Endocrine disorder, unspecified: Secondary | ICD-10-CM

## 2022-03-14 LAB — HCG, QUANTITATIVE, PREGNANCY: hCG, Beta Chain, Quant, S: 3566 m[IU]/mL — ABNORMAL HIGH (ref <5)

## 2022-03-14 NOTE — MAU Note (Signed)
Theresa Winters is a 36 y.o. at [redacted]w[redacted]d here in MAU reporting: here for follow up hcg. Denies pain and bleeding.   Onset of complaint: ongoing  Pain score: 0/10  Vitals:   03/14/22 1317  BP: 113/64  Pulse: 74  Resp: 16  Temp: 98.2 F (36.8 C)  SpO2: 100%     Lab orders placed from triage: hcg

## 2022-03-14 NOTE — MAU Provider Note (Signed)
History   Chief Complaint:  Follow-up   Theresa Winters is  36 y.o. [id number] Patient's last menstrual period was 01/29/2022.Marland Kitchen Patient is here for follow up of quantitative HCG and ongoing surveillance of pregnancy status. She is [redacted]w[redacted]d weeks gestation  by LMP.     Since her last visit, the patient is without new complaint. The patient reports bleeding as  none now.  She denies any pain.   General ROS:  negative   Her previous Quantitative HCG values are:  7/20: 1991  Physical Exam   Blood pressure 113/64, pulse 74, temperature 98.2 F (36.8 C), temperature source Oral, resp. rate 16, height 5\' 7"  (1.702 m), weight 65.6 kg, last menstrual period 01/29/2022, SpO2 100 %, currently breastfeeding.   Physical Exam Vitals and nursing note reviewed.  Constitutional:      General: She is not in acute distress.    Appearance: She is well-developed.  HENT:     Head: Normocephalic.  Eyes:     Pupils: Pupils are equal, round, and reactive to light.  Cardiovascular:     Rate and Rhythm: Normal rate and regular rhythm.  Pulmonary:     Effort: Pulmonary effort is normal. No respiratory distress.     Breath sounds: Normal breath sounds.  Abdominal:     Palpations: Abdomen is soft.     Tenderness: There is no abdominal tenderness.  Musculoskeletal:        General: Normal range of motion.     Cervical back: Normal range of motion.  Skin:    General: Skin is warm and dry.  Neurological:     Mental Status: She is alert and oriented to person, place, and time.  Psychiatric:        Behavior: Behavior normal.        Thought Content: Thought content normal.        Judgment: Judgment normal.     Labs: Results for orders placed or performed during the hospital encounter of 03/14/22 (from the past 24 hour(s))  hCG, quantitative, pregnancy   Collection Time: 03/14/22  1:14 PM  Result Value Ref Range   hCG, Beta Chain, Quant, S 3,566 (H) <5 mIU/mL    Assessment:   1. Elevated serum  hCG   2. [redacted] weeks gestation of pregnancy     CNM reviewed rise in HCG and recommendation for repeat ultrasound in 2 weeks. Appointment made for 8/1 for viability scan  Plan: -Discharge home in stable condition -First trimester precautions discussed -Patient advised to follow-up with [facility] for viability scan -Patient may return to MAU as needed or if her condition were to change or worsen  [facility] CNM 03/14/2022, 4:13 PM

## 2022-03-24 ENCOUNTER — Other Ambulatory Visit: Payer: Self-pay

## 2022-03-24 ENCOUNTER — Other Ambulatory Visit: Payer: Medicaid Other

## 2022-03-24 DIAGNOSIS — O3680X0 Pregnancy with inconclusive fetal viability, not applicable or unspecified: Secondary | ICD-10-CM

## 2022-08-13 ENCOUNTER — Other Ambulatory Visit: Payer: Self-pay

## 2022-08-13 ENCOUNTER — Encounter (HOSPITAL_BASED_OUTPATIENT_CLINIC_OR_DEPARTMENT_OTHER): Payer: Self-pay | Admitting: Emergency Medicine

## 2022-08-13 ENCOUNTER — Emergency Department (HOSPITAL_BASED_OUTPATIENT_CLINIC_OR_DEPARTMENT_OTHER): Payer: Medicaid Other

## 2022-08-13 DIAGNOSIS — J988 Other specified respiratory disorders: Secondary | ICD-10-CM | POA: Insufficient documentation

## 2022-08-13 DIAGNOSIS — O99519 Diseases of the respiratory system complicating pregnancy, unspecified trimester: Secondary | ICD-10-CM | POA: Insufficient documentation

## 2022-08-13 DIAGNOSIS — Z3A00 Weeks of gestation of pregnancy not specified: Secondary | ICD-10-CM | POA: Insufficient documentation

## 2022-08-13 DIAGNOSIS — B974 Respiratory syncytial virus as the cause of diseases classified elsewhere: Secondary | ICD-10-CM | POA: Insufficient documentation

## 2022-08-13 DIAGNOSIS — Z20822 Contact with and (suspected) exposure to covid-19: Secondary | ICD-10-CM | POA: Insufficient documentation

## 2022-08-13 NOTE — ED Triage Notes (Signed)
Pt reports two of her children tested positive for RSV earlier in the week.  She has had a cough, chest pain, congestion, headache and denies n/v/d.  Chest pain woke her up "it hurt so bad."  "Sometimes I have had heart palpitations as well."

## 2022-08-14 ENCOUNTER — Emergency Department (HOSPITAL_BASED_OUTPATIENT_CLINIC_OR_DEPARTMENT_OTHER)
Admission: EM | Admit: 2022-08-14 | Discharge: 2022-08-14 | Disposition: A | Discharge status: Home / Self Care | Payer: Medicaid Other | Attending: Emergency Medicine | Admitting: Emergency Medicine

## 2022-08-14 DIAGNOSIS — R0789 Other chest pain: Secondary | ICD-10-CM

## 2022-08-14 DIAGNOSIS — B338 Other specified viral diseases: Secondary | ICD-10-CM

## 2022-08-14 LAB — CBC
HCT: 34.4 % — ABNORMAL LOW (ref 36.0–46.0)
Hemoglobin: 11.6 g/dL — ABNORMAL LOW (ref 12.0–15.0)
MCH: 28.2 pg (ref 26.0–34.0)
MCHC: 33.7 g/dL (ref 30.0–36.0)
MCV: 83.5 fL (ref 80.0–100.0)
Platelets: 201 10*3/uL (ref 150–400)
RBC: 4.12 MIL/uL (ref 3.87–5.11)
RDW: 14.2 % (ref 11.5–15.5)
WBC: 8.6 10*3/uL (ref 4.0–10.5)
nRBC: 0 % (ref 0.0–0.2)

## 2022-08-14 LAB — BASIC METABOLIC PANEL
Anion gap: 5 (ref 5–15)
BUN: 20 mg/dL (ref 6–20)
CO2: 24 mmol/L (ref 22–32)
Calcium: 8.5 mg/dL — ABNORMAL LOW (ref 8.9–10.3)
Chloride: 107 mmol/L (ref 98–111)
Creatinine, Ser: 0.86 mg/dL (ref 0.44–1.00)
GFR, Estimated: >60 mL/min (ref >60)
Glucose, Bld: 103 mg/dL — ABNORMAL HIGH (ref 70–99)
Potassium: 3.4 mmol/L — ABNORMAL LOW (ref 3.5–5.1)
Sodium: 136 mmol/L (ref 135–145)

## 2022-08-14 LAB — TROPONIN I (HIGH SENSITIVITY): Troponin I (High Sensitivity): 2 ng/L (ref <18)

## 2022-08-14 LAB — PREGNANCY, URINE: Preg Test, Ur: POSITIVE — AB

## 2022-08-14 LAB — RESP PANEL BY RT-PCR (RSV, FLU A&B, COVID)  RVPGX2
Influenza A by PCR: NEGATIVE
Influenza B by PCR: NEGATIVE
Resp Syncytial Virus by PCR: POSITIVE — AB
SARS Coronavirus 2 by RT PCR: NEGATIVE

## 2022-08-14 NOTE — ED Provider Notes (Signed)
[facility] EMERGENCY DEPARTMENT  Provider Note  CSN: [id number] Arrival date & time: 08/13/22 2207  History Chief Complaint  Patient presents with   Cough   Chest Pain    Theresa Winters is a 36 y.o. female with no significant PMH reports two of her children recently tested positive for RSV. She has been having a dry cough and some chest soreness for the last several days. She denies fever. No vomiting. Occasional has some heart palpitations but that has been going on for several weeks. She was recently pregnant but terminated her pregnancy 6 days ago. No vaginal bleeding.    Home Medications Prior to Admission medications   Medication Sig Start Date End Date Taking? Authorizing Provider  EPINEPHrine 0.3 mg/0.3 mL IJ SOAJ injection Inject 0.3 mg into the muscle as needed for anaphylaxis. 06/02/21   Redwine, Madison A, PA-C  pantoprazole (PROTONIX) 40 MG tablet Take 40 mg by mouth daily. 01/02/21   [provider]     Allergies    Patient has no known allergies.   Review of Systems   Review of Systems Please see HPI for pertinent positives and negatives  Physical Exam BP (!) 149/102 (BP Location: Right Arm)   Pulse (!) 104   Temp 99.8 F (37.7 C) (Oral)   Resp 20   Ht 5\' 6"  (1.676 m)   Wt 68 kg   LMP 01/29/2022   SpO2 100%   BMI 24.21 kg/m   Physical Exam Vitals and nursing note reviewed.  Constitutional:      Appearance: Normal appearance.  HENT:     Head: Normocephalic and atraumatic.     Nose: Nose normal.     Mouth/Throat:     Mouth: Mucous membranes are moist.  Eyes:     Extraocular Movements: Extraocular movements intact.     Conjunctiva/sclera: Conjunctivae normal.  Cardiovascular:     Rate and Rhythm: Normal rate.  Pulmonary:     Effort: Pulmonary effort is normal.     Breath sounds: Normal breath sounds. No wheezing or rales.  Abdominal:     General: Abdomen is flat.     Palpations: Abdomen is soft.     Tenderness: There  is no abdominal tenderness.  Musculoskeletal:        General: No swelling. Normal range of motion.     Cervical back: Neck supple.  Skin:    General: Skin is warm and dry.  Neurological:     General: No focal deficit present.     Mental Status: She is alert.  Psychiatric:        Mood and Affect: Mood normal.     ED Results / Procedures / Treatments   EKG EKG Interpretation  Date/Time:  Thursday August 13 2022 23:29:16 EST Ventricular Rate:  89 PR Interval:  152 QRS Duration: 94 QT Interval:  350 QTC Calculation: 425 R Axis:   54 Text Interpretation: Normal sinus rhythm Normal ECG When compared with ECG of 12-Oct-2018 17:21, No significant change since last tracing Confirmed by 14-Oct-2018 [phone]) on 08/14/2022 3:24:21 AM  Procedures Procedures  Medications Ordered in the ED Medications - No data to display  Initial Impression and Plan  Patient here with viral URI symptoms. Also having some vague chest discomfort and palpitations. Labs done in triage show CBC with mild anemia. BMP is unremarkable. Trop is neg. HCG is positive as expected from recent termination. Nasal swab is positive for RSV consistent with history. Recommend she continue with  symptomatic care at home. Aggressive oral hydration, OTC symptomatic meds for comfort. RTED for any other concerns.   ED Course       MDM Rules/Calculators/A&P Medical Decision Making Given presenting complaint, I considered that admission might be necessary. After review of results from ED lab and/or imaging studies, admission to the hospital is not indicated at this time.    Problems Addressed: Atypical chest pain: acute illness or injury RSV infection: acute illness or injury  Amount and/or Complexity of Data Reviewed Labs: ordered. Decision-making details documented in ED Course. Radiology: ordered and independent interpretation performed. Decision-making details documented in ED Course. ECG/medicine tests:  ordered and independent interpretation performed. Decision-making details documented in ED Course.  Risk OTC drugs.    Final Clinical Impression(s) / ED Diagnoses Final diagnoses:  RSV infection  Atypical chest pain    Rx / DC Orders ED Discharge Orders     None        Truddie Hidden, MD 08/14/22 [phone]

## 2022-08-14 NOTE — ED Notes (Signed)
Pt continues to cough but is satisified w/ provider's explanation to her.  Pt asked for a note for work which was given w/ provider's approval.

## 2022-12-24 LAB — CBC AND DIFFERENTIAL
HCT: 34 — AB (ref 36–46)
Hemoglobin: 10.8 — AB (ref 12.0–16.0)
Platelets: 232 10*3/uL (ref 150–400)
WBC: 8

## 2022-12-24 LAB — CBC: RBC: 4.07 (ref 3.87–5.11)

## 2023-01-27 ENCOUNTER — Ambulatory Visit: Payer: Self-pay | Admitting: Internal Medicine

## 2023-04-02 ENCOUNTER — Inpatient Hospital Stay (HOSPITAL_COMMUNITY)
Admission: AD | Admit: 2023-04-02 | Discharge: 2023-04-02 | Disposition: A | Discharge status: Home / Self Care | Payer: Self-pay | Attending: Obstetrics and Gynecology | Admitting: Obstetrics and Gynecology

## 2023-04-02 ENCOUNTER — Encounter (HOSPITAL_COMMUNITY): Payer: Self-pay | Admitting: Obstetrics and Gynecology

## 2023-04-02 DIAGNOSIS — Z3A08 8 weeks gestation of pregnancy: Secondary | ICD-10-CM

## 2023-04-02 DIAGNOSIS — O219 Vomiting of pregnancy, unspecified: Secondary | ICD-10-CM

## 2023-04-02 LAB — HEPATITIS B SURFACE ANTIGEN: Hepatitis B Surface Ag: NONREACTIVE

## 2023-04-02 LAB — URINALYSIS, ROUTINE W REFLEX MICROSCOPIC
Bilirubin Urine: NEGATIVE
Glucose, UA: NEGATIVE mg/dL
Hgb urine dipstick: NEGATIVE
Ketones, ur: NEGATIVE mg/dL
Leukocytes,Ua: NEGATIVE
Nitrite: NEGATIVE
Protein, ur: NEGATIVE mg/dL
Specific Gravity, Urine: 1.029 (ref 1.005–1.030)
pH: 5 (ref 5.0–8.0)

## 2023-04-02 LAB — COMPREHENSIVE METABOLIC PANEL
ALT: 11 U/L (ref 0–44)
AST: 16 U/L (ref 15–41)
Albumin: 4.3 g/dL (ref 3.5–5.0)
Alkaline Phosphatase: 42 U/L (ref 38–126)
Anion gap: 9 (ref 5–15)
BUN: 14 mg/dL (ref 6–20)
CO2: 23 mmol/L (ref 22–32)
Calcium: 8.9 mg/dL (ref 8.9–10.3)
Chloride: 101 mmol/L (ref 98–111)
Creatinine, Ser: 0.75 mg/dL (ref 0.44–1.00)
GFR, Estimated: >60 mL/min (ref >60)
Glucose, Bld: 88 mg/dL (ref 70–99)
Potassium: 3.8 mmol/L (ref 3.5–5.1)
Sodium: 133 mmol/L — ABNORMAL LOW (ref 135–145)
Total Bilirubin: 0.4 mg/dL (ref 0.3–1.2)
Total Protein: 7.7 g/dL (ref 6.5–8.1)

## 2023-04-02 LAB — CBC
HCT: 38.8 % (ref 36.0–46.0)
Hemoglobin: 12.4 g/dL (ref 12.0–15.0)
MCH: 25.6 pg — ABNORMAL LOW (ref 26.0–34.0)
MCHC: 32 g/dL (ref 30.0–36.0)
MCV: 80.2 fL (ref 80.0–100.0)
Platelets: 233 10*3/uL (ref 150–400)
RBC: 4.84 MIL/uL (ref 3.87–5.11)
RDW: 16.3 % — ABNORMAL HIGH (ref 11.5–15.5)
WBC: 11.1 10*3/uL — ABNORMAL HIGH (ref 4.0–10.5)
nRBC: 0 % (ref 0.0–0.2)

## 2023-04-02 LAB — WET PREP, GENITAL
Clue Cells Wet Prep HPF POC: NONE SEEN
Sperm: NONE SEEN
Trich, Wet Prep: NONE SEEN
WBC, Wet Prep HPF POC: <10 (ref <10)
Yeast Wet Prep HPF POC: NONE SEEN

## 2023-04-02 LAB — MAGNESIUM: Magnesium: 2 mg/dL (ref 1.7–2.4)

## 2023-04-02 LAB — HIV ANTIBODY (ROUTINE TESTING W REFLEX): HIV Screen 4th Generation wRfx: NONREACTIVE

## 2023-04-02 LAB — POCT PREGNANCY, URINE: Preg Test, Ur: POSITIVE — AB

## 2023-04-02 MED ORDER — ONDANSETRON HCL 4 MG/2ML IJ SOLN
4.0000 mg | Freq: Once | INTRAMUSCULAR | Status: AC
  Administered 2023-04-02: 4 mg via INTRAVENOUS
  Filled 2023-04-02: qty 2

## 2023-04-02 MED ORDER — LACTATED RINGERS IV BOLUS
1000.0000 mL | Freq: Once | INTRAVENOUS | Status: AC
  Administered 2023-04-02: 1000 mL via INTRAVENOUS

## 2023-04-02 MED ORDER — ONDANSETRON HCL 8 MG PO TABS
8.0000 mg | ORAL_TABLET | Freq: Three times a day (TID) | ORAL | 0 refills | Status: DC | PRN
Start: 2023-04-02 — End: 2024-07-06

## 2023-04-02 NOTE — MAU Provider Note (Signed)
Care resumed.  Patient tolerating oral fluids and crackers.  Feeling much better.   Dc home  Darlis Wragg, Harolyn Rutherford, NP 04/02/2023 8:17 PM

## 2023-04-02 NOTE — MAU Provider Note (Addendum)
History     CSN: [id number]  Arrival date and time: 04/02/23 1724   None     Chief Complaint  Patient presents with   Nausea   Emesis   HPI: Theresa Winters is a 37 y.o. [id number] at [redacted]w[redacted]d (by uncertain LMP) who presents to MAU with complaint of dehydration due to nausea and vomiting. She reports decreased PO intake over the past two weeks or so due to her nausea. Has had maybe three meals total over the past week that she can remember. She has been trying to drink small sips of electrolyte beverages and water, but often vomits this up as well. Has noticed decreased urine output - maybe has been urinating once to twice a day. No fevers, chills, recent febrile illnesses. No abdominal pain. No VB, cramping, LOF. She has not tried any medication for symptoms but recalls requiring Zofran in past for nausea in pregnancy. This has been "the worst pregnancy" in terms of nausea symptoms. She feels run down and exhausted from her degree of dehydration.  She also recently found out that her partner was unfaithful to her so she is requesting STI testing today. She said she had urinary discomfort and took leftover antibiotics she had. She subsequently developed yeast infection symptoms and used Monistat. She has noticed whitish discharge since using the Monistat and is unsure if this is BV. No malodorous discharge per her recall.  Past Medical History:  Diagnosis Date   Anemia    with pregnancy   Dyspnea    History of pre-eclampsia    Medical history non-contributory    Scoliosis    Scoliosis    Vaginal Pap smear, abnormal     Past Surgical History:  Procedure Laterality Date   LEEP     THERAPEUTIC ABORTION      Family History  Problem Relation Age of Onset   Cancer Mother        Breast Cancer    Depression Father     Social History   Tobacco Use   Smoking status: Never   Smokeless tobacco: Never  Vaping Use   Vaping status: Never Used  Substance Use Topics   Alcohol use: Not  Currently   Drug use: No    Allergies: No Known Allergies  Medications Prior to Admission  Medication Sig Dispense Refill Last Dose   EPINEPHrine 0.3 mg/0.3 mL IJ SOAJ injection Inject 0.3 mg into the muscle as needed for anaphylaxis. 1 each 0    pantoprazole (PROTONIX) 40 MG tablet Take 40 mg by mouth daily.       Review of Systems  Constitutional:  Positive for appetite change. Negative for chills and fever.  HENT:  Negative for congestion.   Respiratory:  Negative for cough, chest tightness and shortness of breath.   Cardiovascular:  Positive for palpitations. Negative for chest pain.  Gastrointestinal:  Positive for constipation, nausea and vomiting. Negative for abdominal pain and diarrhea.  Genitourinary:  Positive for decreased urine volume and vaginal discharge. Negative for dysuria, flank pain, vaginal bleeding and vaginal pain.  Neurological:  Negative for dizziness and light-headedness.   Physical Exam   Blood pressure 127/83, pulse 85, temperature 98.6 F (37 C), temperature source Oral, resp. rate 16, height 5\' 7"  (1.702 m), weight 68.9 kg, last menstrual period 02/05/2023, SpO2 100%, unknown if currently breastfeeding.  Physical Exam Constitutional:      General: She is not in acute distress.    Appearance: Normal appearance. She is not toxic-appearing.  HENT:     Head: Normocephalic and atraumatic.     Mouth/Throat:     Mouth: Mucous membranes are dry.  Cardiovascular:     Rate and Rhythm: Regular rhythm. Tachycardia present.     Heart sounds: Normal heart sounds.  Pulmonary:     Effort: Pulmonary effort is normal.     Breath sounds: Normal breath sounds.  Abdominal:     General: Abdomen is flat. Bowel sounds are normal.     Palpations: Abdomen is soft.  Musculoskeletal:     Right lower leg: No edema.     Left lower leg: No edema.  Skin:    General: Skin is warm and dry.     Findings: No rash.  Neurological:     Mental Status: She is alert and oriented  to person, place, and time.     MAU Course  Procedures  MDM 7:40 PM  Pt presenting with 2 wk history of poor PO intake in the setting of nausea and vomiting in early pregnancy. She is tachycardic and has dry mucous membranes on exam. Will hydrate with 1L LR (though likely will need more fluids) and IV Zofran. Obtaining labs to ensure no significant electrolyte derangements. Discussed with patient that ultimately she will require PO trial prior to d/c. Will also obtain STI labs and r/o BV/yeast/STI causing abnormal vaginal discharge. Plan discussed with patient.   8:03 PM  Patient handed off to NP Anh Mangano.   Assessment and Plan  Nausea and vomiting in pregnancy: IV hydration, anti-emetics, checking labs.   Supervision of pregnancy in first trimester: Uncertain LMP, will require dating U/S -- recommend she f/up with OB to establish pregnancy care.   Patient handed off to NP [facility].  Sundra Aland, MD OB Fellow, Faculty Practice [facility], [facility] for [facility] resumed.   Patient tolerating oral fluids and crackers.   Feeling much better.    Dc home   Keyon Liller, Harolyn Rutherford, NP 04/02/2023 8:17 PM

## 2023-04-02 NOTE — MAU Note (Addendum)
.  Theresa Winters is a 37 y.o. at Unknown here in MAU reporting: N/V with the inability to keep any food or fluids down. She reports this has been an issue for the past two weeks. Last attempted to eat a salad at 1215 and was unsuccessful. She reports she feels drained and her mouth is dry. She reports she is also concerned about a yeast infection. She reports she this has also been ongoing for two weeks. She reports she used a monistat suppository as well as a 7 day treatment. She reports her vaginal itching has resolved but reports she feels as if the medication is still deep within her vagina. Also endorses a HA for the past two days. Denies VB.  Patient requesting STI testing.  LMP: 02/05/2023 Onset of complaint: x2 weeks Pain score: Denies pain.  Lab orders placed from triage: POCT Preg

## 2023-08-15 ENCOUNTER — Emergency Department (HOSPITAL_COMMUNITY)
Admission: EM | Admit: 2023-08-15 | Discharge: 2023-08-15 | Disposition: A | Discharge status: Home / Self Care | Payer: Self-pay | Attending: Student | Admitting: Student

## 2023-08-15 ENCOUNTER — Other Ambulatory Visit: Payer: Self-pay

## 2023-08-15 ENCOUNTER — Encounter (HOSPITAL_COMMUNITY): Payer: Self-pay | Admitting: Emergency Medicine

## 2023-08-15 ENCOUNTER — Emergency Department (HOSPITAL_COMMUNITY): Payer: Self-pay

## 2023-08-15 DIAGNOSIS — M549 Dorsalgia, unspecified: Secondary | ICD-10-CM | POA: Insufficient documentation

## 2023-08-15 DIAGNOSIS — S0012XA Contusion of left eyelid and periocular area, initial encounter: Secondary | ICD-10-CM | POA: Insufficient documentation

## 2023-08-15 DIAGNOSIS — S60211A Contusion of right wrist, initial encounter: Secondary | ICD-10-CM | POA: Insufficient documentation

## 2023-08-15 DIAGNOSIS — R309 Painful micturition, unspecified: Secondary | ICD-10-CM | POA: Insufficient documentation

## 2023-08-15 DIAGNOSIS — Z3A01 Less than 8 weeks gestation of pregnancy: Secondary | ICD-10-CM

## 2023-08-15 DIAGNOSIS — M545 Low back pain, unspecified: Secondary | ICD-10-CM

## 2023-08-15 LAB — WET PREP, GENITAL
Sperm: NONE SEEN
Trich, Wet Prep: NONE SEEN
WBC, Wet Prep HPF POC: <10 (ref <10)
Yeast Wet Prep HPF POC: NONE SEEN

## 2023-08-15 LAB — URINALYSIS, ROUTINE W REFLEX MICROSCOPIC
Bilirubin Urine: NEGATIVE
Glucose, UA: NEGATIVE mg/dL
Hgb urine dipstick: NEGATIVE
Ketones, ur: NEGATIVE mg/dL
Nitrite: NEGATIVE
Protein, ur: 100 mg/dL — AB
Specific Gravity, Urine: 1.009 (ref 1.005–1.030)
pH: 5 (ref 5.0–8.0)

## 2023-08-15 LAB — HCG, QUANTITATIVE, PREGNANCY: hCG, Beta Chain, Quant, S: 664 m[IU]/mL — ABNORMAL HIGH (ref <5)

## 2023-08-15 LAB — RPR: RPR Ser Ql: NONREACTIVE

## 2023-08-15 LAB — HIV ANTIBODY (ROUTINE TESTING W REFLEX): HIV Screen 4th Generation wRfx: NONREACTIVE

## 2023-08-15 LAB — PREGNANCY, URINE: Preg Test, Ur: POSITIVE — AB

## 2023-08-15 MED ORDER — CEFTRIAXONE SODIUM 500 MG IJ SOLR
500.0000 mg | Freq: Once | INTRAMUSCULAR | Status: AC
  Administered 2023-08-15: 500 mg via INTRAMUSCULAR
  Filled 2023-08-15: qty 500

## 2023-08-15 MED ORDER — LIDOCAINE HCL (PF) 1 % IJ SOLN
1.0000 mL | Freq: Once | INTRAMUSCULAR | Status: AC
  Administered 2023-08-15: 2.1 mL
  Filled 2023-08-15: qty 5

## 2023-08-15 MED ORDER — LIDOCAINE 5 % EX PTCH: 1.0000 | MEDICATED_PATCH | CUTANEOUS | 0 refills | Status: DC

## 2023-08-15 MED ORDER — DICLOFENAC SODIUM 1 % EX GEL: 4.0000 g | Freq: Four times a day (QID) | CUTANEOUS | 0 refills | Status: DC

## 2023-08-15 MED ORDER — DOXYCYCLINE HYCLATE 100 MG PO TABS
100.0000 mg | ORAL_TABLET | Freq: Once | ORAL | Status: AC
  Administered 2023-08-15: 100 mg via ORAL
  Filled 2023-08-15: qty 1

## 2023-08-15 MED ORDER — LIDOCAINE 5 % EX PTCH
1.0000 | MEDICATED_PATCH | CUTANEOUS | Status: DC
  Administered 2023-08-15: 1 via TRANSDERMAL
  Filled 2023-08-15: qty 1

## 2023-08-15 MED ORDER — NAPROXEN 250 MG PO TABS
500.0000 mg | ORAL_TABLET | Freq: Once | ORAL | Status: AC
  Administered 2023-08-15: 500 mg via ORAL
  Filled 2023-08-15: qty 2

## 2023-08-15 MED ORDER — ACETAMINOPHEN 500 MG PO TABS: 1000.0000 mg | ORAL_TABLET | Freq: Three times a day (TID) | ORAL | 0 refills | Status: AC

## 2023-08-15 MED ORDER — CEFADROXIL 500 MG PO CAPS
500.0000 mg | ORAL_CAPSULE | Freq: Two times a day (BID) | ORAL | 0 refills | Status: AC
Start: 2023-08-15 — End: 2023-08-22

## 2023-08-15 MED ORDER — AZITHROMYCIN 1 G PO PACK
1.0000 g | PACK | Freq: Once | ORAL | Status: AC
  Administered 2023-08-15: 1 g via ORAL
  Filled 2023-08-15: qty 1

## 2023-08-15 MED ORDER — DICLOFENAC SODIUM 1 % EX GEL
4.0000 g | Freq: Once | CUTANEOUS | Status: AC
  Administered 2023-08-15: 4 g via TOPICAL
  Filled 2023-08-15: qty 100
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
## 2023-08-17 LAB — GC/CHLAMYDIA PROBE AMP (~~LOC~~) NOT AT ARMC
Chlamydia: NEGATIVE
Comment: NEGATIVE
Comment: NORMAL
Neisseria Gonorrhea: NEGATIVE

## 2023-09-16 DIAGNOSIS — Z8759 Personal history of other complications of pregnancy, childbirth and the puerperium: Secondary | ICD-10-CM | POA: Insufficient documentation

## 2023-09-16 DIAGNOSIS — O09529 Supervision of elderly multigravida, unspecified trimester: Secondary | ICD-10-CM | POA: Insufficient documentation

## 2023-10-07 ENCOUNTER — Encounter (HOSPITAL_BASED_OUTPATIENT_CLINIC_OR_DEPARTMENT_OTHER): Payer: Self-pay | Admitting: Emergency Medicine

## 2023-10-07 ENCOUNTER — Emergency Department (HOSPITAL_BASED_OUTPATIENT_CLINIC_OR_DEPARTMENT_OTHER)
Admission: EM | Admit: 2023-10-07 | Discharge: 2023-10-07 | Disposition: A | Discharge status: Home / Self Care | Payer: Self-pay | Attending: Emergency Medicine | Admitting: Emergency Medicine

## 2023-10-07 ENCOUNTER — Other Ambulatory Visit: Payer: Self-pay

## 2023-10-07 DIAGNOSIS — W228XXA Striking against or struck by other objects, initial encounter: Secondary | ICD-10-CM | POA: Insufficient documentation

## 2023-10-07 DIAGNOSIS — S0083XA Contusion of other part of head, initial encounter: Secondary | ICD-10-CM | POA: Insufficient documentation

## 2023-10-07 NOTE — Discharge Instructions (Addendum)
For pain control you may take 1000 mg of acetaminophen (Tylenol) every 8 hours and/or 600 mg of Ibuprofen (Motrin, Advil, etc.) every 6-8 hours as needed.  Please limit acetaminophen (Tylenol) to 4000 mg and Ibuprofen (Motrin, Advil, etc.) to 2400 mg for a 24hr period. Please note that other over-the-counter medicine may contain acetaminophen or ibuprofen as a component of their ingredients.
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
## 2023-10-07 NOTE — ED Provider Notes (Signed)
[place] EMERGENCY DEPARTMENT AT [facility] [place] Provider Note  CSN: [id number] Arrival date & time: 10/07/23 2127  Chief Complaint(s) No chief complaint on file.  HPI Theresa Winters is a 38 y.o. female here for right-sided scalp pain since Sunday.  Patient reports hitting it on the edge of her trunk while trying to get a box out.  Patient denies any loss of consciousness or other injuries related to the incident.  States that she admittedly felt pain but has been tolerable without taken any medicine.  Today she noted that the pain was more severe prompting her to take over-the-counter medicine.  She has noted some discomfort with yawning or chewing since the incident.  Also endorses intermittent dizziness which was ongoing prior to the incident.  These episodes are related to head movement, are brief and self limiting.  She denies any fevers.  No neck pain or back pain.  No visual disturbance.  No nausea or vomiting.  No other physical complaints.  Patient is not anticoagulated.  She does have a history of thrombocytopenia related to pregnancy.  Last CBC showed normal platelet count in August.  The history is provided by the patient.    Past Medical History Past Medical History:  Diagnosis Date   Anemia    with pregnancy   Dyspnea    History of pre-eclampsia    Medical history non-contributory    Scoliosis    Scoliosis    Vaginal Pap smear, abnormal    Patient Active Problem List   Diagnosis Date Noted   Normal postpartum course 03/10/2021   Encounter for induction of labor 03/10/2021   Thrombocytopenia (HCC) 03/08/2021   SVD (7/16) 03/08/2021   Scoliosis deformity of spine 08/22/2018   Anemia of pregnancy 06/28/2018   Panic attacks 01/06/2018   Home Medication(s) Prior to Admission medications   Medication Sig Start Date End Date Taking? Authorizing Provider  diclofenac Sodium (VOLTAREN) 1 % GEL Apply 4 g topically 4 (four) times daily. 08/15/23   Kommor,  Madison, MD  EPINEPHrine 0.3 mg/0.3 mL IJ SOAJ injection Inject 0.3 mg into the muscle as needed for anaphylaxis. 06/02/21   Redwine, Madison A, PA-C  lidocaine (LIDODERM) 5 % Place 1 patch onto the skin daily. Remove & Discard patch within 12 hours or as directed by MD 08/15/23   Kommor, Wyn Forster, MD  ondansetron (ZOFRAN) 8 MG tablet Take 1 tablet (8 mg total) by mouth every 8 (eight) hours as needed. 04/02/23   Rasch, Victorino Dike I, NP  pantoprazole (PROTONIX) 40 MG tablet Take 40 mg by mouth daily. 01/02/21   [provider]                                                                                                                                    Allergies Patient has no known allergies.  Review of Systems Review of Systems As noted in HPI  Physical Exam Vital  Signs  I have reviewed the triage vital signs BP 121/86 (BP Location: Right Arm)   Pulse 67   Temp 98.2 F (36.8 C) (Oral)   Resp 17   Ht 5\' 7"  (1.702 m)   Wt 64.9 kg   LMP 02/05/2023 (Exact Date)   SpO2 100%   Breastfeeding No   BMI 22.40 kg/m   Physical Exam Vitals reviewed.  Constitutional:      General: She is not in acute distress.    Appearance: She is well-developed. She is not diaphoretic.  HENT:     Head: Normocephalic. Contusion present. No raccoon eyes, Battle's sign, abrasion or laceration.     Jaw: There is normal jaw occlusion. No tenderness.      Right Ear: Tympanic membrane and external ear normal. No mastoid tenderness.     Left Ear: Tympanic membrane and external ear normal. No mastoid tenderness.     Nose: Nose normal.  Eyes:     General: No scleral icterus.    Conjunctiva/sclera: Conjunctivae normal.  Neck:     Trachea: Phonation normal.  Cardiovascular:     Rate and Rhythm: Normal rate and regular rhythm.  Pulmonary:     Effort: Pulmonary effort is normal. No respiratory distress.     Breath sounds: No stridor.  Abdominal:     General: There is no distension.   Musculoskeletal:        General: Normal range of motion.     Cervical back: Normal range of motion. No spinous process tenderness or muscular tenderness.  Neurological:     Mental Status: She is alert and oriented to person, place, and time.  Psychiatric:        Behavior: Behavior normal.     ED Results and Treatments Labs (all labs ordered are listed, but only abnormal results are displayed) Labs Reviewed - No data to display                                                                                                                       EKG  EKG Interpretation Date/Time:    Ventricular Rate:    PR Interval:    QRS Duration:    QT Interval:    QTC Calculation:   R Axis:      Text Interpretation:         Radiology No results found.  Medications Ordered in ED Medications - No data to display Procedures Procedures  (including critical care time) Medical Decision Making / ED Course   Medical Decision Making   Minor head trauma.  Differential diagnosis considered. Favor soft tissue contusion with some components of posttraumatic neuralgia. Doubt ICH.  Doubt serious skull injury. No imaging needed at this time. Patient has afebrile and does not have any infectious symptoms concerning for meningitis. Not concerning for temporal arteritis. Recommend continue supportive management    Final Clinical Impression(s) / ED Diagnoses Final diagnoses:  Contusion of other part of head, initial encounter   The patient  appears reasonably screened and/or stabilized for discharge and I doubt any other medical condition or other [facility] requiring further screening, evaluation, or treatment in the ED at this time. I have discussed the findings, Dx and Tx plan with the patient/family who expressed understanding and agree(s) with the plan. Discharge instructions discussed at length. The patient/family was given strict return precautions who verbalized understanding of the  instructions. No further questions at time of discharge.  Disposition: Discharge  Condition: Good  ED Discharge Orders     None         Follow Up: [facility], [facility] [address] [place] [address] [phone]  Call  to schedule an appointment for close follow up    This chart was dictated using voice recognition software.  Despite best efforts to proofread,  errors can occur which can change the documentation meaning.    Nira Conn, MD 10/07/23 2352

## 2023-12-01 ENCOUNTER — Other Ambulatory Visit: Payer: Self-pay

## 2023-12-01 ENCOUNTER — Encounter (HOSPITAL_BASED_OUTPATIENT_CLINIC_OR_DEPARTMENT_OTHER): Payer: Self-pay

## 2023-12-01 ENCOUNTER — Emergency Department (HOSPITAL_BASED_OUTPATIENT_CLINIC_OR_DEPARTMENT_OTHER): Payer: Self-pay

## 2023-12-01 ENCOUNTER — Emergency Department (HOSPITAL_BASED_OUTPATIENT_CLINIC_OR_DEPARTMENT_OTHER)
Admission: EM | Admit: 2023-12-01 | Discharge: 2023-12-01 | Disposition: A | Discharge status: Home / Self Care | Payer: Self-pay | Attending: Emergency Medicine | Admitting: Emergency Medicine

## 2023-12-01 DIAGNOSIS — Z21 Asymptomatic human immunodeficiency virus [HIV] infection status: Secondary | ICD-10-CM | POA: Insufficient documentation

## 2023-12-01 DIAGNOSIS — Z859 Personal history of malignant neoplasm, unspecified: Secondary | ICD-10-CM | POA: Insufficient documentation

## 2023-12-01 DIAGNOSIS — S0990XA Unspecified injury of head, initial encounter: Secondary | ICD-10-CM

## 2023-12-01 DIAGNOSIS — R519 Headache, unspecified: Secondary | ICD-10-CM | POA: Insufficient documentation

## 2023-12-01 MED ORDER — METHOCARBAMOL 500 MG PO TABS: 1000.0000 mg | ORAL_TABLET | Freq: Four times a day (QID) | ORAL | 0 refills | Status: DC

## 2023-12-01 NOTE — ED Provider Notes (Signed)
  EMERGENCY DEPARTMENT AT [facility] Provider Note   CSN: [id number] Arrival date & time: 12/01/23  1054     History  Chief Complaint  Patient presents with   Headache    Theresa Winters is a 38 y.o. female.  Patient presents to the emergency department for persistent right-sided occipital headache.  Patient sustained a head injury where she hit her head on the trunk.  This was in February.  She was seen in the emergency department.  No head imaging at that time.  She has had occipital headache without vomiting or confusion.  She is uncertain if the headache is related to the injury or if it is related to dehydration.  She reports being very busy recently and has not been drinking enough fluids.  She also had some diarrhea recently that has resolved.  No vision change.  No weakness in the arms of the legs.  She does feel off balance at times.  No trouble focusing or other concussion symptoms.  She is requesting head imaging to ensure no signs of head injury.       Home Medications Prior to Admission medications   Medication Sig Start Date End Date Taking? Authorizing Provider  diclofenac Sodium (VOLTAREN) 1 % GEL Apply 4 g topically 4 (four) times daily. 08/15/23   Kommor, Madison, MD  EPINEPHrine 0.3 mg/0.3 mL IJ SOAJ injection Inject 0.3 mg into the muscle as needed for anaphylaxis. 06/02/21   Redwine, Madison A, PA-C  lidocaine (LIDODERM) 5 % Place 1 patch onto the skin daily. Remove & Discard patch within 12 hours or as directed by MD 08/15/23   Kommor, Wyn Forster, MD  ondansetron (ZOFRAN) 8 MG tablet Take 1 tablet (8 mg total) by mouth every 8 (eight) hours as needed. 04/02/23   Rasch, Victorino Dike I, NP  pantoprazole (PROTONIX) 40 MG tablet Take 40 mg by mouth daily. 01/02/21   [provider]      Allergies    Patient has no known allergies.    Review of Systems   Review of Systems  Physical Exam Updated Vital Signs BP 114/77 (BP Location: Left Arm)    Pulse 72   Temp (!) 97.5 F (36.4 C)   Resp 18   Ht 5\' 7"  (1.702 m)   Wt 63.5 kg   LMP 11/13/2023 (Exact Date)   SpO2 100%   Breastfeeding No   BMI 21.93 kg/m   Physical Exam Vitals and nursing note reviewed.  Constitutional:      Appearance: She is well-developed.  HENT:     Head: Normocephalic and atraumatic. No raccoon eyes or Battle's sign.     Comments: Tenderness right occiput without swelling    Right Ear: Tympanic membrane, ear canal and external ear normal. No hemotympanum.     Left Ear: Tympanic membrane, ear canal and external ear normal. No hemotympanum.     Nose: Nose normal.     Mouth/Throat:     Pharynx: Uvula midline.  Eyes:     General: Lids are normal.     Extraocular Movements:     Right eye: No nystagmus.     Left eye: No nystagmus.     Conjunctiva/sclera: Conjunctivae normal.     Pupils: Pupils are equal, round, and reactive to light.     Comments: No visible hyphema noted  Cardiovascular:     Rate and Rhythm: Normal rate and regular rhythm.  Pulmonary:     Effort: Pulmonary effort is normal.  Breath sounds: Normal breath sounds.  Abdominal:     Palpations: Abdomen is soft.     Tenderness: There is no abdominal tenderness.  Musculoskeletal:     Cervical back: Normal range of motion and neck supple. No tenderness or bony tenderness.     Thoracic back: No tenderness or bony tenderness.     Lumbar back: No tenderness or bony tenderness.  Skin:    General: Skin is warm and dry.  Neurological:     Mental Status: She is alert and oriented to person, place, and time.     GCS: GCS eye subscore is 4. GCS verbal subscore is 5. GCS motor subscore is 6.     Cranial Nerves: No cranial nerve deficit.     Sensory: No sensory deficit.     Coordination: Coordination normal.     ED Results / Procedures / Treatments   Labs (all labs ordered are listed, but only abnormal results are displayed) Labs Reviewed - No data to  display  EKG None  Radiology No results found.  Procedures Procedures    Medications Ordered in ED Medications - No data to display  ED Course/ Medical Decision Making/ A&P    Patient seen and examined. History obtained directly from patient.   Labs/EKG: None ordered  Imaging: Ordered CT head without contrast.  Discussed intracranial injury is less likely given reassuring neuroexam.  Discussed risks and benefits of imaging.  Patient would like to proceed with imaging to ensure that she does not have any significant injury.  Medications/Fluids: None ordered  Most recent vital signs reviewed and are as follows: BP 114/77 (BP Location: Left Arm)   Pulse 72   Temp (!) 97.5 F (36.4 C)   Resp 18   Ht 5\' 7"  (1.702 m)   Wt 63.5 kg   LMP 11/13/2023 (Exact Date)   SpO2 100%   Breastfeeding No   BMI 21.93 kg/m   Initial impression: Occipital neuralgia, head injury  1:51 PM Reassessment performed. Patient appears stable, comfortable.  Exam unchanged.  Imaging personally visualized and interpreted including: CT head, gree negative.  Reviewed pertinent lab work and imaging with patient at bedside. Questions answered.   Most current vital signs reviewed and are as follows: BP 114/77 (BP Location: Left Arm)   Pulse 72   Temp (!) 97.5 F (36.4 C)   Resp 18   Ht 5\' 7"  (1.702 m)   Wt 63.5 kg   LMP 11/13/2023 (Exact Date)   SpO2 100%   Breastfeeding No   BMI 21.93 kg/m   Plan: Discharge to home.   Prescriptions written for: Robaxin trial  Patient counseled on proper use of muscle relaxant medication.  They were told not to drink alcohol, drive any vehicle, or do any dangerous activities while taking this medication.  Patient verbalized understanding.  Other home care instructions discussed:  Rest, ice/heat  ED return instructions discussed:  Patient was counseled on head injury precautions and symptoms that should indicate their return to the ED.  These include  severe worsening headache, vision changes, confusion, loss of consciousness, trouble walking, nausea & vomiting, or weakness/tingling in extremities.    Follow-up instructions discussed:  Patient encouraged to follow-up with their PCP in 7 days, if not improved.                                   Medical Decision Making Amount and/or Complexity of  Data Reviewed Radiology: ordered.  Risk Prescription drug management.   In regards to the patient's headache, critical differentials were considered including subarachnoid hemorrhage, intracerebral hemorrhage, epidural/subdural hematoma, pituitary apoplexy, vertebral/carotid artery dissection, giant cell arteritis, central venous thrombosis, reversible cerebral vasoconstriction, acute angle closure glaucoma, idiopathic intracranial hypertension, bacterial meningitis, viral encephalitis, carbon monoxide poisoning, posterior reversible encephalopathy syndrome, pre-eclampsia.   Reg flag symptoms related to these causes were considered including systemic symptoms (fever, weight loss), neurologic symptoms (confusion, mental status change, vision change, associated seizure), acute or sudden "thunderclap" onset, patient age 32 or older with new or progressive headache, patient of any age with first headache or change in headache pattern, pregnant or postpartum status, history of HIV or other immunocompromise, history of cancer, headache occurring with exertion, associated neck or shoulder pain, associated traumatic injury, concurrent use of anticoagulation, family history of spontaneous SAH, and concurrent drug use.    Other benign, more common causes of headache were considered including migraine, tension-type headache, cluster headache, referred pain from other cause such as sinus infection, dental pain, trigeminal neuralgia.   On exam, patient has a reassuring neuro exam including baseline mental status, no significant neck pain or meningeal signs, no  signs of severe infection or fever.   CT head was reassuring.  Symptoms may be musculoskeletal in nature.  Trial muscle relaxer and continue OTC meds.  Overall low concern for concussion persisting this far out after her injury.  The patient's vital signs, pertinent lab work and imaging were reviewed and interpreted as discussed in the ED course. Hospitalization was considered for further testing, treatments, or serial exams/observation. However as patient is well-appearing, has a stable exam over the course of their evaluation, and reassuring studies today, I do not feel that they warrant admission at this time. This plan was discussed with the patient who verbalizes agreement and comfort with this plan and seems reliable and able to return to the Emergency Department with worsening or changing symptoms.          Final Clinical Impression(s) / ED Diagnoses Final diagnoses:  Acute nonintractable headache, unspecified headache type  Injury of head, initial encounter    Rx / DC Orders ED Discharge Orders          Ordered    methocarbamol (ROBAXIN) 500 MG tablet  4 times daily        12/01/23 1347              Renne Crigler, PA-C 12/01/23 1353    Terrilee Files, MD 12/01/23 1714

## 2023-12-01 NOTE — ED Triage Notes (Signed)
 Pt reports having a pain in the back of her head. Pt states that she hit her head on the truck of a car a few weeks ago and that the pain has been there off and on. Pt wants it evaluated since it has pain has been staying longer. States it could be stress but she is not sure. States that sometimes she looses her balance.

## 2023-12-01 NOTE — Discharge Instructions (Signed)
 Please read and follow all provided instructions.  Your diagnoses today include:  1. Acute nonintractable headache, unspecified headache type   2. Injury of head, initial encounter     Tests performed today include: CT scan of your head that did not show any serious injury. Vital signs. See below for your results today.   Medications prescribed:  Robaxin (methocarbamol) - muscle relaxer medication  DO NOT drive or perform any activities that require you to be awake and alert because this medicine can make you drowsy.   Take any prescribed medications only as directed.  Home care instructions:  Follow any educational materials contained in this packet.  BE VERY CAREFUL not to take multiple medicines containing Tylenol (also called acetaminophen). Doing so can lead to an overdose which can damage your liver and cause liver failure and possibly death.   Follow-up instructions: Please follow-up with your primary care provider in the next 7 days for further evaluation of your symptoms.   Return instructions:  SEEK IMMEDIATE MEDICAL ATTENTION IF: There is confusion or drowsiness (although children frequently become drowsy after injury).  You cannot awaken the injured person.  You have more than one episode of vomiting.  You notice dizziness or unsteadiness which is getting worse, or inability to walk.  You have convulsions or unconsciousness.  You experience severe, persistent headaches not relieved by Tylenol. You cannot use arms or legs normally.  There are changes in pupil sizes. (This is the black center in the colored part of the eye)  There is clear or bloody discharge from the nose or ears.  You have change in speech, vision, swallowing, or understanding.  Localized weakness, numbness, tingling, or change in bowel or bladder control. You have any other emergent concerns.  Additional Information: You have had a head injury which does not appear to require admission at this  time.  Your vital signs today were: BP 114/77 (BP Location: Left Arm)   Pulse 72   Temp (!) 97.5 F (36.4 C)   Resp 18   Ht 5\' 7"  (1.702 m)   Wt 63.5 kg   LMP 11/13/2023 (Exact Date)   SpO2 100%   Breastfeeding No   BMI 21.93 kg/m  If your blood pressure (BP) was elevated above 135/85 this visit, please have this repeated by your doctor within one month. --------------

## 2023-12-01 NOTE — ED Notes (Signed)
Pt ambulatory to room, steady gait 

## 2024-03-07 ENCOUNTER — Inpatient Hospital Stay (HOSPITAL_COMMUNITY): Payer: Self-pay

## 2024-03-07 ENCOUNTER — Encounter (HOSPITAL_COMMUNITY): Payer: Self-pay | Admitting: Obstetrics and Gynecology

## 2024-03-07 ENCOUNTER — Inpatient Hospital Stay (HOSPITAL_COMMUNITY)
Admission: AD | Admit: 2024-03-07 | Discharge: 2024-03-07 | Disposition: A | Discharge status: Home / Self Care | Payer: Self-pay | Attending: Family Medicine | Admitting: Family Medicine

## 2024-03-07 DIAGNOSIS — N760 Acute vaginitis: Secondary | ICD-10-CM

## 2024-03-07 DIAGNOSIS — B9689 Other specified bacterial agents as the cause of diseases classified elsewhere: Secondary | ICD-10-CM

## 2024-03-07 DIAGNOSIS — O209 Hemorrhage in early pregnancy, unspecified: Secondary | ICD-10-CM | POA: Insufficient documentation

## 2024-03-07 DIAGNOSIS — R109 Unspecified abdominal pain: Secondary | ICD-10-CM | POA: Insufficient documentation

## 2024-03-07 DIAGNOSIS — O26892 Other specified pregnancy related conditions, second trimester: Secondary | ICD-10-CM

## 2024-03-07 DIAGNOSIS — O23591 Infection of other part of genital tract in pregnancy, first trimester: Secondary | ICD-10-CM | POA: Insufficient documentation

## 2024-03-07 DIAGNOSIS — Z3A01 Less than 8 weeks gestation of pregnancy: Secondary | ICD-10-CM

## 2024-03-07 DIAGNOSIS — O26899 Other specified pregnancy related conditions, unspecified trimester: Secondary | ICD-10-CM

## 2024-03-07 HISTORY — DX: Urinary tract infection, site not specified: N39.0

## 2024-03-07 LAB — WET PREP, GENITAL
Sperm: NONE SEEN
Trich, Wet Prep: NONE SEEN
WBC, Wet Prep HPF POC: <10 (ref <10)
Yeast Wet Prep HPF POC: NONE SEEN

## 2024-03-07 LAB — URINALYSIS, ROUTINE W REFLEX MICROSCOPIC
Bacteria, UA: NONE SEEN
Bilirubin Urine: NEGATIVE
Glucose, UA: NEGATIVE mg/dL
Hgb urine dipstick: NEGATIVE
Ketones, ur: NEGATIVE mg/dL
Nitrite: NEGATIVE
Protein, ur: NEGATIVE mg/dL
Specific Gravity, Urine: 1.025 (ref 1.005–1.030)
pH: 5 (ref 5.0–8.0)

## 2024-03-07 LAB — COMPREHENSIVE METABOLIC PANEL WITH GFR
ALT: 11 U/L (ref 0–44)
AST: 17 U/L (ref 15–41)
Albumin: 4.3 g/dL (ref 3.5–5.0)
Alkaline Phosphatase: 44 U/L (ref 38–126)
Anion gap: 12 (ref 5–15)
BUN: 18 mg/dL (ref 6–20)
CO2: 22 mmol/L (ref 22–32)
Calcium: 9.1 mg/dL (ref 8.9–10.3)
Chloride: 103 mmol/L (ref 98–111)
Creatinine, Ser: 0.86 mg/dL (ref 0.44–1.00)
GFR, Estimated: >60 mL/min (ref >60)
Glucose, Bld: 95 mg/dL (ref 70–99)
Potassium: 3.2 mmol/L — ABNORMAL LOW (ref 3.5–5.1)
Sodium: 137 mmol/L (ref 135–145)
Total Bilirubin: 0.8 mg/dL (ref 0.0–1.2)
Total Protein: 7.4 g/dL (ref 6.5–8.1)

## 2024-03-07 LAB — CBC
HCT: 37.8 % (ref 36.0–46.0)
Hemoglobin: 12.3 g/dL (ref 12.0–15.0)
MCH: 26.2 pg (ref 26.0–34.0)
MCHC: 32.5 g/dL (ref 30.0–36.0)
MCV: 80.6 fL (ref 80.0–100.0)
Platelets: 229 K/uL (ref 150–400)
RBC: 4.69 MIL/uL (ref 3.87–5.11)
RDW: 16.3 % — ABNORMAL HIGH (ref 11.5–15.5)
WBC: 6.9 K/uL (ref 4.0–10.5)
nRBC: 0 % (ref 0.0–0.2)

## 2024-03-07 LAB — HCG, QUANTITATIVE, PREGNANCY: hCG, Beta Chain, Quant, S: 9328 m[IU]/mL — ABNORMAL HIGH (ref <5)

## 2024-03-07 LAB — POCT PREGNANCY, URINE: Preg Test, Ur: POSITIVE — AB

## 2024-03-07 MED ORDER — METRONIDAZOLE 0.75 % VA GEL: 1.0000 | Freq: Every day | VAGINAL | 0 refills | Status: AC

## 2024-03-07 NOTE — MAU Provider Note (Addendum)
 Chief Complaint: Vaginal Bleeding, Abdominal Pain, and Possible Pregnancy   Event Date/Time   First Provider Initiated Contact with Patient 03/07/24 0918      SUBJECTIVE HPI: Theresa Winters is a 38 y.o. [id number] at [redacted]w[redacted]d by LMP who presents to maternity admissions reporting over a week of urinary frequency, odor, and now a couple days of vaginal bleeding and abdominal cramping. She experienced onset of suprapubic pressure and urinary frequency last week. In the past several days she noticed discoloration and odor in her urine. Reports her partner recently cheated on her. Also she has noticed several days of light vaginal bleeding and lower abdominal cramping. She is fearful that this is another miscarriage as this is what it felt like that time.   Past Medical History:  Diagnosis Date   Anemia    with pregnancy   Dyspnea    History of pre-eclampsia    after second delivery   Medical history non-contributory    Scoliosis    Scoliosis    UTI (urinary tract infection)    Vaginal Pap smear, abnormal    LEEP, normal since   Past Surgical History:  Procedure Laterality Date   LEEP     THERAPEUTIC ABORTION     Social History   Socioeconomic History   Marital status: Single    Spouse name: Not on file   Number of children: Not on file   Years of education: Not on file   Highest education level: Not on file  Occupational History   Not on file  Tobacco Use   Smoking status: Never   Smokeless tobacco: Never  Vaping Use   Vaping status: Never Used  Substance and Sexual Activity   Alcohol use: Not Currently   Drug use: No   Sexual activity: Yes    Birth control/protection: None  Other Topics Concern   Not on file  Social History Narrative   Not on file   Social Drivers of Health   Financial Resource Strain: Not on file  Food Insecurity: Not on file  Transportation Needs: Not on file  Physical Activity: Not on file  Stress: Not on file  Social Connections: Unknown  (12/30/2021)   Received from [facility]   Social Network    Social Network: Not on file  Intimate Partner Violence: Unknown (11/26/2021)   Received from [facility]   HITS    Physically Hurt: Not on file    Insult or Talk Down To: Not on file    Threaten Physical Harm: Not on file    Scream or Curse: Not on file   No current facility-administered medications on file prior to encounter.   Current Outpatient Medications on File Prior to Encounter  Medication Sig Dispense Refill   EPINEPHrine  0.3 mg/0.3 mL IJ SOAJ injection Inject 0.3 mg into the muscle as needed for anaphylaxis. 1 each 0   lidocaine  (LIDODERM ) 5 % Place 1 patch onto the skin daily. Remove & Discard patch within 12 hours or as directed by MD 30 patch 0   ondansetron  (ZOFRAN ) 8 MG tablet Take 1 tablet (8 mg total) by mouth every 8 (eight) hours as needed. 20 tablet 0   pantoprazole (PROTONIX) 40 MG tablet Take 40 mg by mouth daily.     No Known Allergies  ROS:  Pertinent positives/negatives listed above.  I have reviewed patient's Past Medical Hx, Surgical Hx, Family Hx, Social Hx, medications and allergies.   Physical Exam  Patient Vitals for the past 24 hrs:  BP Temp Temp src Pulse Resp SpO2 Height Weight  03/07/24 1059 (!) 130/90 -- -- 81 -- -- -- --  03/07/24 0840 (!) 136/90 98.5 F (36.9 C) Oral 79 16 100 % 5' 7 (1.702 m) 64.2 kg   Constitutional: Well-developed, well-nourished female in no acute distress.  Cardiovascular: normal rate Respiratory: normal effort ABD: soft, nontender Neurologic: Alert   LAB RESULTS Results for orders placed or performed during the hospital encounter of 03/07/24 (from the past 24 hours)  Urinalysis, Routine w reflex microscopic -Urine, Clean Catch     Status: Abnormal   Collection Time: 03/07/24  9:00 AM  Result Value Ref Range   Color, Urine YELLOW YELLOW   APPearance HAZY (A) CLEAR   Specific Gravity, Urine 1.025 1.005 - 1.030   pH 5.0 5.0 - 8.0   Glucose, UA  NEGATIVE NEGATIVE mg/dL   Hgb urine dipstick NEGATIVE NEGATIVE   Bilirubin Urine NEGATIVE NEGATIVE   Ketones, ur NEGATIVE NEGATIVE mg/dL   Protein, ur NEGATIVE NEGATIVE mg/dL   Nitrite NEGATIVE NEGATIVE   Leukocytes,Ua SMALL (A) NEGATIVE   RBC / HPF 0-5 0 - 5 RBC/hpf   WBC, UA 21-50 0 - 5 WBC/hpf   Bacteria, UA NONE SEEN NONE SEEN   Squamous Epithelial / HPF 0-5 0 - 5 /HPF   Mucus PRESENT   Wet prep, genital     Status: Abnormal   Collection Time: 03/07/24  9:00 AM   Specimen: Vaginal  Result Value Ref Range   Yeast Wet Prep HPF POC NONE SEEN NONE SEEN   Trich, Wet Prep NONE SEEN NONE SEEN   Clue Cells Wet Prep HPF POC PRESENT (A) NONE SEEN   WBC, Wet Prep HPF POC <10 <10   Sperm NONE SEEN   Pregnancy, urine POC     Status: Abnormal   Collection Time: 03/07/24  9:01 AM  Result Value Ref Range   Preg Test, Ur POSITIVE (A) NEGATIVE  CBC     Status: Abnormal   Collection Time: 03/07/24  9:53 AM  Result Value Ref Range   WBC 6.9 4.0 - 10.5 K/uL   RBC 4.69 3.87 - 5.11 MIL/uL   Hemoglobin 12.3 12.0 - 15.0 g/dL   HCT 62.1 63.9 - 53.9 %   MCV 80.6 80.0 - 100.0 fL   MCH 26.2 26.0 - 34.0 pg   MCHC 32.5 30.0 - 36.0 g/dL   RDW 83.6 (H) 88.4 - 84.4 %   Platelets 229 150 - 400 K/uL   nRBC 0.0 0.0 - 0.2 %  hCG, quantitative, pregnancy     Status: Abnormal   Collection Time: 03/07/24  9:53 AM  Result Value Ref Range   hCG, Beta Chain, Quant, S 9,328 (H) <5 mIU/mL  Comprehensive metabolic panel     Status: Abnormal   Collection Time: 03/07/24  9:53 AM  Result Value Ref Range   Sodium 137 135 - 145 mmol/L   Potassium 3.2 (L) 3.5 - 5.1 mmol/L   Chloride 103 98 - 111 mmol/L   CO2 22 22 - 32 mmol/L   Glucose, Bld 95 70 - 99 mg/dL   BUN 18 6 - 20 mg/dL   Creatinine, Ser 9.13 0.44 - 1.00 mg/dL   Calcium 9.1 8.9 - 89.6 mg/dL   Total Protein 7.4 6.5 - 8.1 g/dL   Albumin 4.3 3.5 - 5.0 g/dL   AST 17 15 - 41 U/L   ALT 11 0 - 44 U/L   Alkaline Phosphatase 44 38 -  126 U/L   Total  Bilirubin 0.8 0.0 - 1.2 mg/dL   GFR, Estimated >39 >39 mL/min   Anion gap 12 5 - 15      IMAGING US  OB LESS THAN 14 WEEKS WITH OB TRANSVAGINAL Result Date: 03/07/2024 CLINICAL DATA:  [id number] Abdominal cramping affecting pregnancy [id number] EXAM: OBSTETRIC <14 WK US  AND TRANSVAGINAL OB US  TECHNIQUE: Both transabdominal and transvaginal ultrasound examinations were performed for complete evaluation of the gestation as well as the maternal uterus, adnexal regions, and pelvic cul-de-sac. Transvaginal technique was performed to assess early pregnancy. COMPARISON:  March 12, 2022 FINDINGS: Intrauterine gestational sac: Single Yolk sac:  Present, measures 5 mm Fetal Pole:  Not present, at this time. Cardiac Activity: Not present, at this time. MSD: 12 mm   6 w   5 d Subchorionic hemorrhage: Curvilinear hypoechoic structure along the gestational sac, measuring approximately 1.4 cm. Maternal uterus/adnexae: Hypoechoic structure in the right ovary measuring 1.8 cm, possibly the corpus luteum. No free pelvic fluid. IMPRESSION: 1. Single intrauterine gestation with a yolk sac present and estimated gestational age of [redacted] weeks, 5 days. No fetal pole or fetal heartbeat visualized at this time, possibly due to the earliness of the gestation. Close obstetric follow-up with a repeat first trimester pregnancy ultrasound in 14 days is recommended to assess for progression of the pregnancy. 2. Curvilinear, hypoechoic structure along the gestational sac, measuring 1.4 cm, possibly changes from implantation versus a small subchronic hematoma. Attention on follow-up imaging recommended. These results will be called to the ordering clinician or representative by the Radiologist Assistant and communication documented in the PACS or [employer]. Electronically Signed   By: Rogelia Myers M.D.   On: 03/07/2024 10:52    MAU Management/MDM: Orders Placed This Encounter  Procedures   Wet prep, genital   US  OB LESS THAN 14 WEEKS  WITH OB TRANSVAGINAL   Urinalysis, Routine w reflex microscopic -Urine, Clean Catch   CBC   hCG, quantitative, pregnancy   Comprehensive metabolic panel   Pregnancy, urine POC   Discharge patient    Meds ordered this encounter  Medications   metroNIDAZOLE  (METROGEL ) 0.75 % vaginal gel    Sig: Place 1 Applicatorful vaginally at bedtime for 5 days. For 5 days.    Dispense:  50 g    Refill:  0    - UA not consistent with UTI, wet prep consistent with bacterial vaginosis, GC pending patient will be notified of results - Prescribed Metrogel  0.75% one applicator at bedtime for 5 days for the BV - CBC appropriate, CMP with normal kidney and liver function - Transvaginal OB US : gestational sac with yolk sac but no visualized embryo. Plan for repeat US  to assess viability in 14 days. - beta HCG <10000   Suspect the vaginal spotting is from the BV; however, with the lack of embryo on US  and betaHCG lower than expected for [redacted] weeks GA, we discussed that this could represent a nonviable pregnancy. Will pursue further viability US  as detailed above  - counseled patient on miscarriage precautions and encouraged her to return to MAU if she has worsening bleeding, bleeding requiring her to change her pad in an hour, increased cramping or passage of tissue - Follow up as scheduled with [facility] on 03/21/24; messaged the practice to make sure she has a repeat US  in 14 days  ASSESSMENT 1. Bacterial vaginosis   2. [redacted] weeks gestation of pregnancy   3. Vaginal bleeding affecting early pregnancy  4. Abdominal cramping affecting pregnancy     PLAN - Follow up as scheduled with [facility] on 03/21/24; messaged the practice to make sure she has a repeat US  in fourteen days - Discharge home with strict return precautions. Allergies as of 03/07/2024   No Known Allergies      Medication List     STOP taking these medications    diclofenac  Sodium 1 % Gel Commonly known as: Voltaren     methocarbamol  500 MG tablet Commonly known as: ROBAXIN        TAKE these medications    EPINEPHrine  0.3 mg/0.3 mL Soaj injection Commonly known as: EPI-PEN Inject 0.3 mg into the muscle as needed for anaphylaxis.   lidocaine  5 % Commonly known as: Lidoderm  Place 1 patch onto the skin daily. Remove & Discard patch within 12 hours or as directed by MD   metroNIDAZOLE  0.75 % vaginal gel Commonly known as: METROGEL  Place 1 Applicatorful vaginally at bedtime for 5 days. For 5 days.   ondansetron  8 MG tablet Commonly known as: Zofran  Take 1 tablet (8 mg total) by mouth every 8 (eight) hours as needed.   pantoprazole 40 MG tablet Commonly known as: PROTONIX Take 40 mg by mouth daily.        Follow-up Information     [facility] Follow up.   Specialty: Obstetrics and Gynecology Why: for routinely scheduled OB appointment on 7/29 Contact information: [address] [place] [address] [phone]                 Elio Alena Morrison, MD Family Medicine PGY1 03/07/2024  11:25 AM  Attestation of Supervision of Student:  I confirm that I have verified the information documented in the resident's note and that I have also personally directly supervised the history, physical exam and all medical decision making activities.  I have verified that all services and findings are accurately documented in this student's note; and I agree with management and plan as outlined in the documentation. I have also made any necessary editorial changes.  Message sent to attending, Dr. Henry, who will schedule viability scan in office at 10-14 days.  Almarie CHRISTELLA Moats, MD OB Fellow 03/07/2024 11:40 AM

## 2024-03-07 NOTE — MAU Note (Signed)
 Theresa Winters is a 38 y.o. at Unknown here in MAU reporting: woke up this morning and noted spotting, pinkish, no clots. Has had cramping last few days, lower abd. Feels like she might have a UTI.strong smell to urine and increased urge to pee. No pain with urination.  Has not been seen in office yet.  LMP: 5/23 Onset of complaint: today Pain score: mild Vitals:   03/07/24 0840  BP: (!) 136/90  Pulse: 79  Resp: 16  Temp: 98.5 F (36.9 C)  SpO2: 100%      Lab orders placed from triage:  UA, UPT, vag swabs

## 2024-03-08 LAB — GC/CHLAMYDIA PROBE AMP (~~LOC~~) NOT AT ARMC
Chlamydia: NEGATIVE
Comment: NEGATIVE
Comment: NORMAL
Neisseria Gonorrhea: NEGATIVE

## 2024-07-06 ENCOUNTER — Encounter (HOSPITAL_COMMUNITY): Payer: Self-pay

## 2024-07-06 ENCOUNTER — Other Ambulatory Visit: Payer: Self-pay

## 2024-07-06 ENCOUNTER — Inpatient Hospital Stay (HOSPITAL_COMMUNITY)
Admission: AD | Admit: 2024-07-06 | Discharge: 2024-07-06 | Disposition: A | Discharge status: Home / Self Care | Payer: MEDICAID | Attending: Obstetrics & Gynecology | Admitting: Obstetrics & Gynecology

## 2024-07-06 DIAGNOSIS — Z3A08 8 weeks gestation of pregnancy: Secondary | ICD-10-CM | POA: Diagnosis not present

## 2024-07-06 DIAGNOSIS — O219 Vomiting of pregnancy, unspecified: Secondary | ICD-10-CM

## 2024-07-06 DIAGNOSIS — R531 Weakness: Secondary | ICD-10-CM | POA: Diagnosis present

## 2024-07-06 LAB — URINALYSIS, ROUTINE W REFLEX MICROSCOPIC
Bilirubin Urine: NEGATIVE
Glucose, UA: NEGATIVE mg/dL
Hgb urine dipstick: NEGATIVE
Ketones, ur: 20 mg/dL — AB
Leukocytes,Ua: NEGATIVE
Nitrite: NEGATIVE
Protein, ur: 30 mg/dL — AB
Specific Gravity, Urine: 1.031 — ABNORMAL HIGH (ref 1.005–1.030)
pH: 5 (ref 5.0–8.0)

## 2024-07-06 LAB — BASIC METABOLIC PANEL WITH GFR
Anion gap: 9 (ref 5–15)
BUN: 18 mg/dL (ref 6–20)
CO2: 22 mmol/L (ref 22–32)
Calcium: 9.1 mg/dL (ref 8.9–10.3)
Chloride: 101 mmol/L (ref 98–111)
Creatinine, Ser: 0.66 mg/dL (ref 0.44–1.00)
GFR, Estimated: >60 mL/min (ref >60)
Glucose, Bld: 86 mg/dL (ref 70–99)
Potassium: 3.5 mmol/L (ref 3.5–5.1)
Sodium: 132 mmol/L — ABNORMAL LOW (ref 135–145)

## 2024-07-06 MED ORDER — ONDANSETRON HCL 8 MG PO TABS: 8.0000 mg | ORAL_TABLET | Freq: Three times a day (TID) | ORAL | 1 refills | Status: AC | PRN

## 2024-07-06 MED ORDER — SCOPOLAMINE 1 MG/3DAYS TD PT72: 1.0000 | MEDICATED_PATCH | TRANSDERMAL | 12 refills | Status: AC

## 2024-07-06 MED ORDER — PROMETHAZINE HCL 25 MG PO TABS: 25.0000 mg | ORAL_TABLET | Freq: Four times a day (QID) | ORAL | 2 refills | Status: AC | PRN

## 2024-07-06 MED ORDER — SCOPOLAMINE 1 MG/3DAYS TD PT72
1.0000 | MEDICATED_PATCH | TRANSDERMAL | Status: DC
  Administered 2024-07-06: 1 mg via TRANSDERMAL
  Filled 2024-07-06: qty 1

## 2024-07-06 MED ORDER — ONDANSETRON 4 MG PO TBDP
8.0000 mg | ORAL_TABLET | Freq: Once | ORAL | Status: AC
  Administered 2024-07-06: 8 mg via ORAL
  Filled 2024-07-06: qty 2

## 2024-07-06 NOTE — MAU Provider Note (Signed)
 None     S [name] is a 38 y.o. [id number] female at [redacted]w[redacted]d who presents to MAU today with complaint of nausea and vomiting.  She had her pregnancy confirmed with ultrasound at [facility] in October.  She has been taking Diclegis which has not been very helpful, reports vomiting has been worsening over the past week.  She has vomited 5 times in the past 24 hours.  Denies any abdominal pain, vaginal bleeding, diarrhea, constipation.  Receives care at [facility].  Pertinent items noted in HPI and remainder of comprehensive ROS otherwise negative.   O BP 111/75 (BP Location: Right Arm)   Pulse 90   Temp (!) 97.4 F (36.3 C) (Oral)   Resp 16   Ht 5' 7 (1.702 m)   Wt 67 kg   LMP 05/05/2024   SpO2 100%   Breastfeeding Unknown   BMI 23.15 kg/m  Physical Exam Vitals reviewed.  Constitutional:      General: She is not in acute distress.    Appearance: She is well-developed. She is not diaphoretic.  Eyes:     General: No scleral icterus. Pulmonary:     Effort: Pulmonary effort is normal. No respiratory distress.  Skin:    General: Skin is warm and dry.  Neurological:     Mental Status: She is alert.     Coordination: Coordination normal.    Results for orders placed or performed during the hospital encounter of 07/06/24 (from the past 24 hours)  Urinalysis, Routine w reflex microscopic -Urine, Clean Catch     Status: Abnormal   Collection Time: 07/06/24  9:25 AM  Result Value Ref Range   Color, Urine YELLOW YELLOW   APPearance HAZY (A) CLEAR   Specific Gravity, Urine 1.031 (H) 1.005 - 1.030   pH 5.0 5.0 - 8.0   Glucose, UA NEGATIVE NEGATIVE mg/dL   Hgb urine dipstick NEGATIVE NEGATIVE   Bilirubin Urine NEGATIVE NEGATIVE   Ketones, ur 20 (A) NEGATIVE mg/dL   Protein, ur 30 (A) NEGATIVE mg/dL   Nitrite NEGATIVE NEGATIVE   Leukocytes,Ua NEGATIVE NEGATIVE   RBC / HPF 0-5 0 - 5 RBC/hpf   WBC, UA 0-5 0 - 5 WBC/hpf   Bacteria, UA RARE (A) NONE SEEN   Squamous Epithelial / HPF  0-5 0 - 5 /HPF   Mucus PRESENT   Basic metabolic panel     Status: Abnormal   Collection Time: 07/06/24  9:29 AM  Result Value Ref Range   Sodium 132 (L) 135 - 145 mmol/L   Potassium 3.5 3.5 - 5.1 mmol/L   Chloride 101 98 - 111 mmol/L   CO2 22 22 - 32 mmol/L   Glucose, Bld 86 70 - 99 mg/dL   BUN 18 6 - 20 mg/dL   Creatinine, Ser 9.33 0.44 - 1.00 mg/dL   Calcium 9.1 8.9 - 89.6 mg/dL   GFR, Estimated >39 >39 mL/min   Anion gap 9 5 - 15    MDM: Low MAU Course: -Vital signs within normal limits. -UA for hydration status. -BMP to rule out electrolyte abnormalities. -Zofran  and scopolamine patch for nausea while awaiting results. -UA with mild dehydration, encourage PO hydration after nausea improves. -BMP within normal limits.  A 1. Nausea and vomiting during pregnancy (Primary) - Discharge patient  2. [redacted] weeks gestation of pregnancy - Discharge patient  Medical screening exam complete  P Discharge from MAU in stable condition with return precautions Follow up at [facility] as scheduled for ongoing prenatal  care Prescriptions for Phenergan and scopolamine patches sent to pharmacy.  No future appointments. Allergies as of 07/06/2024   No Known Allergies      Medication List     TAKE these medications    ondansetron  8 MG tablet Commonly known as: Zofran  Take 1 tablet (8 mg total) by mouth every 8 (eight) hours as needed for refractory nausea / vomiting. What changed: reasons to take this   promethazine 25 MG tablet Commonly known as: PHENERGAN Take 1 tablet (25 mg total) by mouth every 6 (six) hours as needed for nausea or vomiting.   scopolamine 1 MG/3DAYS Commonly known as: TRANSDERM-SCOP Place 1 patch (1 mg total) onto the skin every 3 (three) days.        Joesph DELENA Sear, PA

## 2024-07-06 NOTE — MAU Note (Addendum)
 Theresa Winters is a 38 y.o. at [redacted]w[redacted]d here in MAU reporting: N/V and weakness- pregnancy confirmed with US  at [facility] in OCT- IUP Vomited 5 times in last 24 hours- Vomiting worse last 1 week LMP: 05/05/24 Onset of complaint: 1 week Pain score: 0/10 Vitals:   07/06/24 0910  BP: 111/75  Pulse: 90  Resp: 16  Temp: (!) 97.4 F (36.3 C)  SpO2: 100%     FHT: na  Lab orders placed from triage: ua Not currently vomiting Diclegis not working last taken 11/12

## 2024-07-06 NOTE — Discharge Instructions (Addendum)
 NAUSEA: Continue Diclegis 1-2 times daily for baseline nausea prevention. START scopolamine patches for baseline nausea prevention. Change the patch once every 72 hours (3 days). START promethazine (Phenergan) as needed every 6 hours for breakthrough nausea. This medication can be taken orally or placed intravaginally. We recommend eating within an hour of getting up, some people even eat a few crackers before getting out of bed. Then make sure you eat at least something small every 2-3 hours, focusing on protein and bland foods. Going too long between meals can make the nausea worse. Also, try not to drink a lot of fluids on an empty stomach if you feel nauseas, this may just make it worse! Peppermint essential oil: you can keep this with you to smell or put a little behind your ears when you're nauseas Ginger candy: I specifically like the [employer] Belly Comfort. You can search online for morning sickness drops/candy and find other things! You can also try Ginger Ale or ginger teas. Frozen fruits or popsicles
